# Patient Record
Sex: Female | Born: 1940 | Race: White | Hispanic: No | Smoking: Former smoker
Health system: Southern US, Community
[De-identification: ages and names within clinical notes are randomized; demographics above are authoritative.]

## PROBLEM LIST (undated history)

## (undated) DIAGNOSIS — I10 Essential (primary) hypertension: Secondary | ICD-10-CM

## (undated) DIAGNOSIS — R4189 Other symptoms and signs involving cognitive functions and awareness: Secondary | ICD-10-CM

## (undated) DIAGNOSIS — F329 Major depressive disorder, single episode, unspecified: Secondary | ICD-10-CM

## (undated) DIAGNOSIS — H269 Unspecified cataract: Secondary | ICD-10-CM

## (undated) DIAGNOSIS — H409 Unspecified glaucoma: Secondary | ICD-10-CM

## (undated) DIAGNOSIS — J449 Chronic obstructive pulmonary disease, unspecified: Secondary | ICD-10-CM

## (undated) DIAGNOSIS — K922 Gastrointestinal hemorrhage, unspecified: Secondary | ICD-10-CM

## (undated) DIAGNOSIS — F32A Depression, unspecified: Secondary | ICD-10-CM

## (undated) DIAGNOSIS — I6529 Occlusion and stenosis of unspecified carotid artery: Secondary | ICD-10-CM

## (undated) DIAGNOSIS — J439 Emphysema, unspecified: Secondary | ICD-10-CM

## (undated) DIAGNOSIS — E119 Type 2 diabetes mellitus without complications: Secondary | ICD-10-CM

## (undated) DIAGNOSIS — M549 Dorsalgia, unspecified: Secondary | ICD-10-CM

## (undated) DIAGNOSIS — I639 Cerebral infarction, unspecified: Secondary | ICD-10-CM

## (undated) DIAGNOSIS — D649 Anemia, unspecified: Secondary | ICD-10-CM

## (undated) DIAGNOSIS — C4491 Basal cell carcinoma of skin, unspecified: Secondary | ICD-10-CM

## (undated) DIAGNOSIS — N289 Disorder of kidney and ureter, unspecified: Secondary | ICD-10-CM

## (undated) DIAGNOSIS — F419 Anxiety disorder, unspecified: Secondary | ICD-10-CM

## (undated) DIAGNOSIS — Z5189 Encounter for other specified aftercare: Secondary | ICD-10-CM

## (undated) HISTORY — DX: Encounter for other specified aftercare: Z51.89

## (undated) HISTORY — PX: COLONOSCOPY: SHX174

## (undated) HISTORY — DX: Anemia, unspecified: D64.9

## (undated) HISTORY — DX: Gastrointestinal hemorrhage, unspecified: K92.2

## (undated) HISTORY — DX: Depression, unspecified: F32.A

## (undated) HISTORY — DX: Anxiety disorder, unspecified: F41.9

## (undated) HISTORY — DX: Unspecified cataract: H26.9

## (undated) HISTORY — DX: Unspecified glaucoma: H40.9

## (undated) HISTORY — DX: Basal cell carcinoma of skin, unspecified: C44.91

## (undated) HISTORY — DX: Chronic obstructive pulmonary disease, unspecified: J44.9

## (undated) HISTORY — DX: Emphysema, unspecified: J43.9

## (undated) HISTORY — DX: Cerebral infarction, unspecified: I63.9

## (undated) HISTORY — PX: VAGINAL HYSTERECTOMY: SUR661

## (undated) HISTORY — PX: BASAL CELL CARCINOMA EXCISION: SHX1214

---
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
## 2018-06-14 ENCOUNTER — Other Ambulatory Visit: Payer: Self-pay | Admitting: Family Medicine

## 2018-06-14 DIAGNOSIS — Z1231 Encounter for screening mammogram for malignant neoplasm of breast: Secondary | ICD-10-CM

## 2019-08-01 ENCOUNTER — Inpatient Hospital Stay (HOSPITAL_COMMUNITY)
Admission: EM | Admit: 2019-08-01 | Discharge: 2019-08-10 | DRG: 304 | Disposition: A | Discharge status: Skilled Nursing Facility | Payer: Medicare Other | Attending: Internal Medicine | Admitting: Internal Medicine

## 2019-08-01 ENCOUNTER — Emergency Department (HOSPITAL_COMMUNITY): Payer: Medicare Other

## 2019-08-01 ENCOUNTER — Other Ambulatory Visit: Payer: Self-pay

## 2019-08-01 ENCOUNTER — Encounter (HOSPITAL_COMMUNITY): Payer: Self-pay | Admitting: *Deleted

## 2019-08-01 DIAGNOSIS — N1832 Chronic kidney disease, stage 3b: Secondary | ICD-10-CM | POA: Diagnosis present

## 2019-08-01 DIAGNOSIS — N3000 Acute cystitis without hematuria: Secondary | ICD-10-CM | POA: Diagnosis present

## 2019-08-01 DIAGNOSIS — I10 Essential (primary) hypertension: Secondary | ICD-10-CM | POA: Diagnosis present

## 2019-08-01 DIAGNOSIS — F17200 Nicotine dependence, unspecified, uncomplicated: Secondary | ICD-10-CM | POA: Diagnosis present

## 2019-08-01 DIAGNOSIS — Z794 Long term (current) use of insulin: Secondary | ICD-10-CM

## 2019-08-01 DIAGNOSIS — Z79899 Other long term (current) drug therapy: Secondary | ICD-10-CM

## 2019-08-01 DIAGNOSIS — I160 Hypertensive urgency: Secondary | ICD-10-CM | POA: Diagnosis not present

## 2019-08-01 DIAGNOSIS — R29704 NIHSS score 4: Secondary | ICD-10-CM | POA: Diagnosis present

## 2019-08-01 DIAGNOSIS — F0390 Unspecified dementia without behavioral disturbance: Secondary | ICD-10-CM | POA: Diagnosis present

## 2019-08-01 DIAGNOSIS — J449 Chronic obstructive pulmonary disease, unspecified: Secondary | ICD-10-CM | POA: Diagnosis present

## 2019-08-01 DIAGNOSIS — R109 Unspecified abdominal pain: Secondary | ICD-10-CM

## 2019-08-01 DIAGNOSIS — N183 Chronic kidney disease, stage 3 unspecified: Secondary | ICD-10-CM | POA: Diagnosis present

## 2019-08-01 DIAGNOSIS — E1122 Type 2 diabetes mellitus with diabetic chronic kidney disease: Secondary | ICD-10-CM | POA: Diagnosis present

## 2019-08-01 DIAGNOSIS — F05 Delirium due to known physiological condition: Secondary | ICD-10-CM | POA: Diagnosis present

## 2019-08-01 DIAGNOSIS — I169 Hypertensive crisis, unspecified: Secondary | ICD-10-CM | POA: Diagnosis not present

## 2019-08-01 DIAGNOSIS — Z7902 Long term (current) use of antithrombotics/antiplatelets: Secondary | ICD-10-CM

## 2019-08-01 DIAGNOSIS — I129 Hypertensive chronic kidney disease with stage 1 through stage 4 chronic kidney disease, or unspecified chronic kidney disease: Secondary | ICD-10-CM | POA: Diagnosis present

## 2019-08-01 DIAGNOSIS — E876 Hypokalemia: Secondary | ICD-10-CM | POA: Diagnosis not present

## 2019-08-01 DIAGNOSIS — G9341 Metabolic encephalopathy: Secondary | ICD-10-CM | POA: Diagnosis present

## 2019-08-01 DIAGNOSIS — E119 Type 2 diabetes mellitus without complications: Secondary | ICD-10-CM

## 2019-08-01 DIAGNOSIS — Z20822 Contact with and (suspected) exposure to covid-19: Secondary | ICD-10-CM | POA: Diagnosis present

## 2019-08-01 DIAGNOSIS — N200 Calculus of kidney: Secondary | ICD-10-CM | POA: Diagnosis present

## 2019-08-01 DIAGNOSIS — I639 Cerebral infarction, unspecified: Secondary | ICD-10-CM | POA: Diagnosis present

## 2019-08-01 HISTORY — DX: Type 2 diabetes mellitus without complications: E11.9

## 2019-08-01 HISTORY — DX: Dorsalgia, unspecified: M54.9

## 2019-08-01 HISTORY — DX: Disorder of kidney and ureter, unspecified: N28.9

## 2019-08-01 HISTORY — DX: Essential (primary) hypertension: I10

## 2019-08-01 HISTORY — DX: Occlusion and stenosis of unspecified carotid artery: I65.29

## 2019-08-01 HISTORY — DX: Other symptoms and signs involving cognitive functions and awareness: R41.89

## 2019-08-01 LAB — URINALYSIS, ROUTINE W REFLEX MICROSCOPIC
Bacteria, UA: NONE SEEN
Bilirubin Urine: NEGATIVE
Glucose, UA: NEGATIVE mg/dL
Hgb urine dipstick: NEGATIVE
Ketones, ur: NEGATIVE mg/dL
Nitrite: NEGATIVE
Protein, ur: >=300 mg/dL — AB
Specific Gravity, Urine: 1.03 (ref 1.005–1.030)
pH: 6 (ref 5.0–8.0)

## 2019-08-01 LAB — COMPREHENSIVE METABOLIC PANEL
ALT: 13 U/L (ref 0–44)
AST: 17 U/L (ref 15–41)
Albumin: 3.4 g/dL — ABNORMAL LOW (ref 3.5–5.0)
Alkaline Phosphatase: 49 U/L (ref 38–126)
Anion gap: 8 (ref 5–15)
BUN: 22 mg/dL (ref 8–23)
CO2: 27 mmol/L (ref 22–32)
Calcium: 9.2 mg/dL (ref 8.9–10.3)
Chloride: 100 mmol/L (ref 98–111)
Creatinine, Ser: 1.38 mg/dL — ABNORMAL HIGH (ref 0.44–1.00)
GFR calc Af Amer: 42 mL/min — ABNORMAL LOW (ref >60)
GFR calc non Af Amer: 37 mL/min — ABNORMAL LOW (ref >60)
Glucose, Bld: 134 mg/dL — ABNORMAL HIGH (ref 70–99)
Potassium: 3.5 mmol/L (ref 3.5–5.1)
Sodium: 135 mmol/L (ref 135–145)
Total Bilirubin: 0.9 mg/dL (ref 0.3–1.2)
Total Protein: 6.5 g/dL (ref 6.5–8.1)

## 2019-08-01 LAB — CBC
HCT: 33.9 % — ABNORMAL LOW (ref 36.0–46.0)
Hemoglobin: 11.2 g/dL — ABNORMAL LOW (ref 12.0–15.0)
MCH: 32.2 pg (ref 26.0–34.0)
MCHC: 33 g/dL (ref 30.0–36.0)
MCV: 97.4 fL (ref 80.0–100.0)
Platelets: 354 10*3/uL (ref 150–400)
RBC: 3.48 MIL/uL — ABNORMAL LOW (ref 3.87–5.11)
RDW: 13.2 % (ref 11.5–15.5)
WBC: 7.7 10*3/uL (ref 4.0–10.5)
nRBC: 0 % (ref 0.0–0.2)

## 2019-08-01 LAB — LIPASE, BLOOD: Lipase: 36 U/L (ref 11–51)

## 2019-08-01 IMAGING — CT CT HEAD W/O CM
4 series · 17 of 47 positions shown, 19 images · non-contrast
Comparison: [DATE]

CLINICAL DATA: Elevated blood pressure left-sided pain.

EXAM:
CT HEAD WITHOUT CONTRAST
TECHNIQUE: Contiguous axial images were obtained from the base of the skull
through the vertex without intravenous contrast.

[Series 2: head wo · axial · 0.38mm/px · z∈[+1322,+1442]mm · 7 of 32 slices shown, 9 images]
[im 4/32  brain]
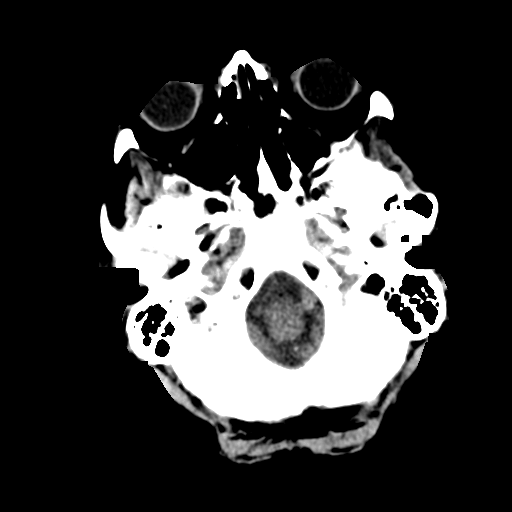
[im 4/32  bone]
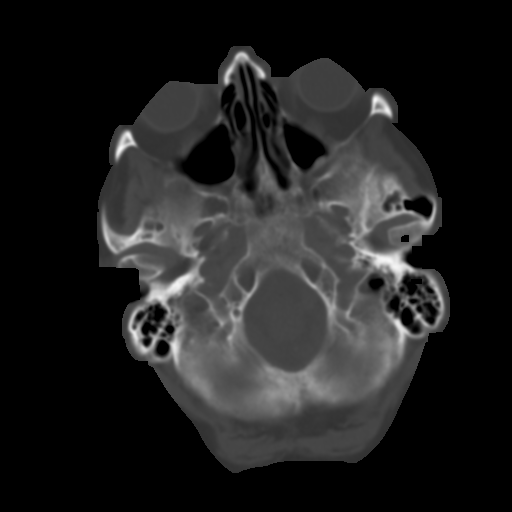
[im 8/32  brain]
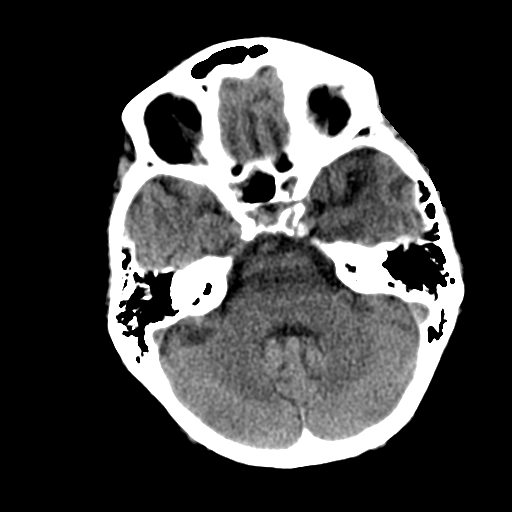
[im 12/32  brain]
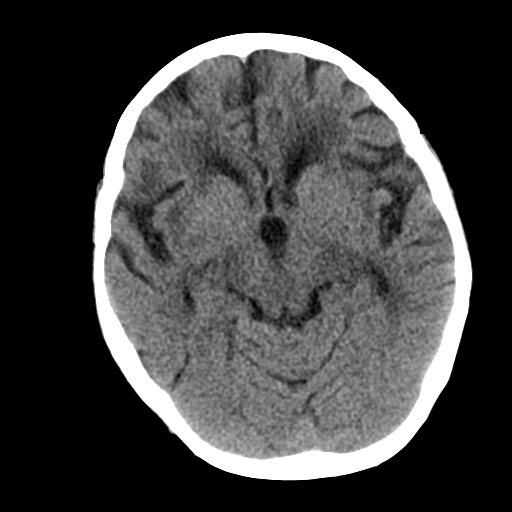
[im 16/32  brain]
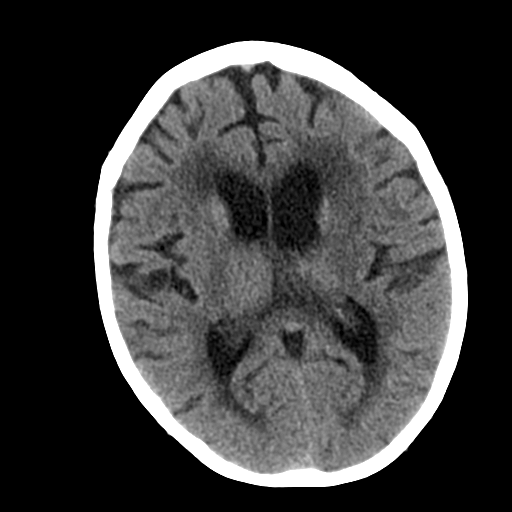
[im 20/32  brain]
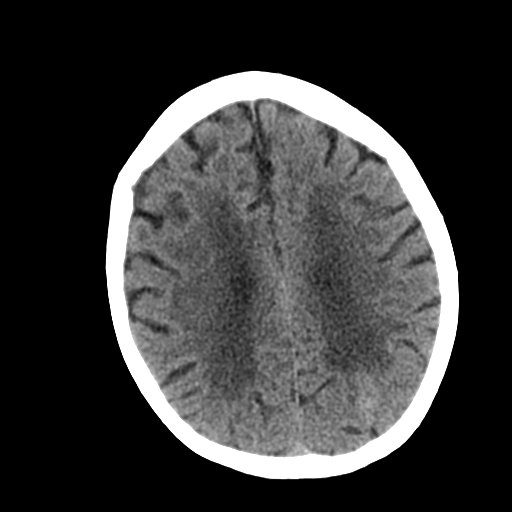
[im 20/32  bone]
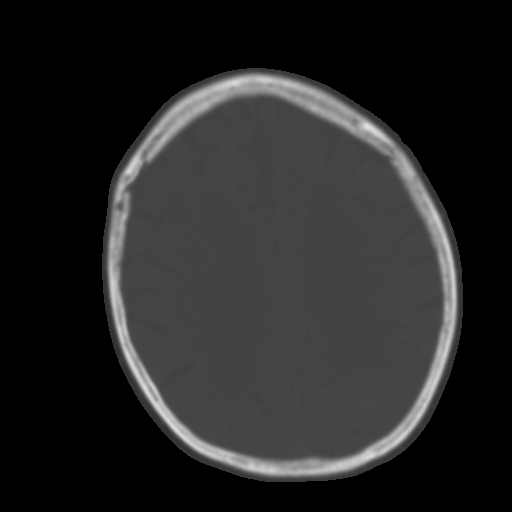
[im 24/32  brain]
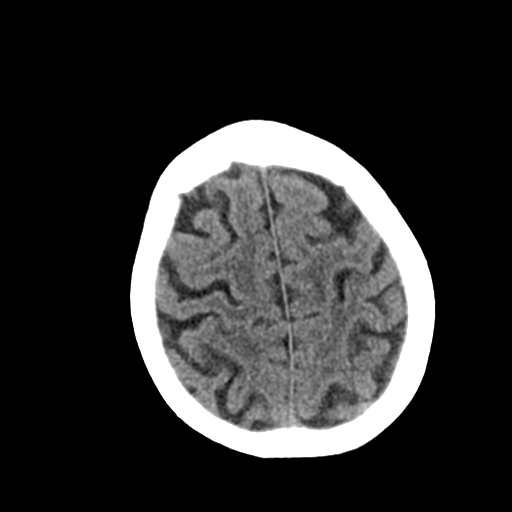
[im 28/32  brain]
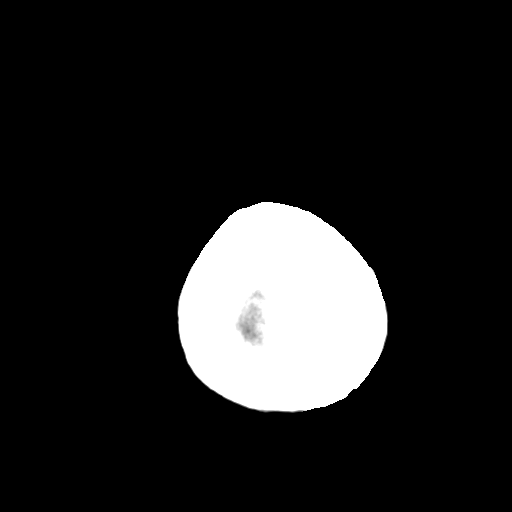

[Series 3: head bone · axial · 0.38mm/px · z∈[+1321,+1377]mm · 4 of 80 slices shown]
[im 8/80  bone]
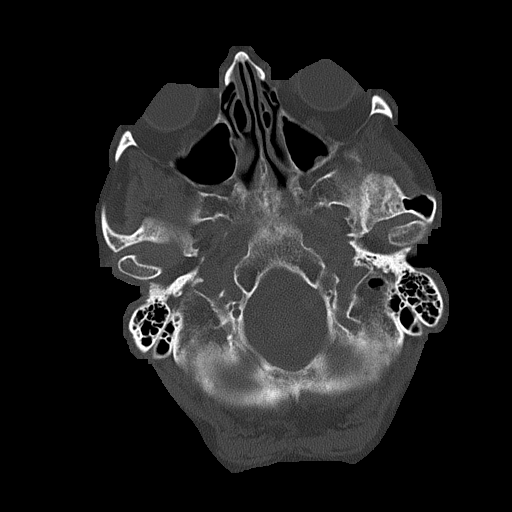
[im 16/80  bone]
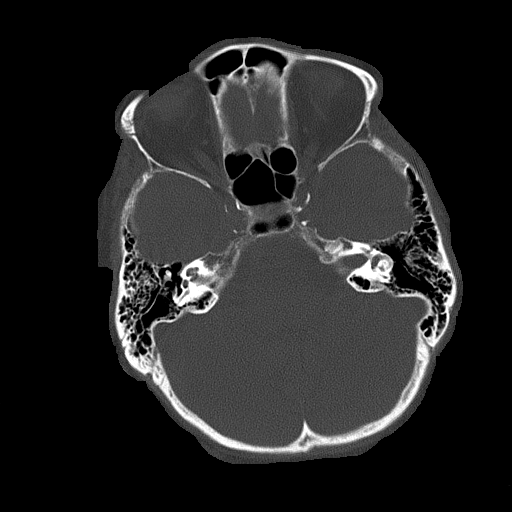
[im 24/80  bone]
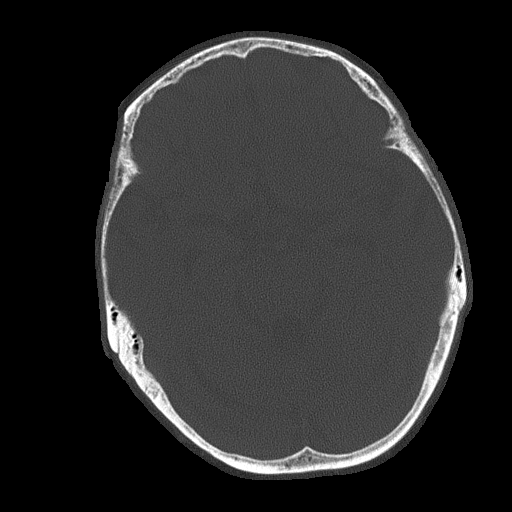
[im 36/80  bone]
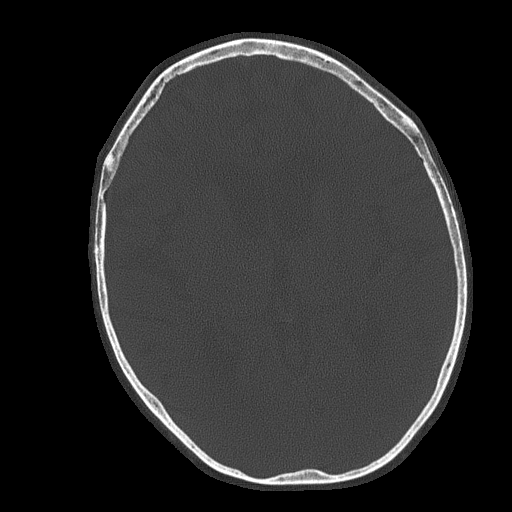

[Series 5: coronal soft tissue · coronal · 0.30mm/px · 3 of 63 slices shown]
[im 21/63  brain]
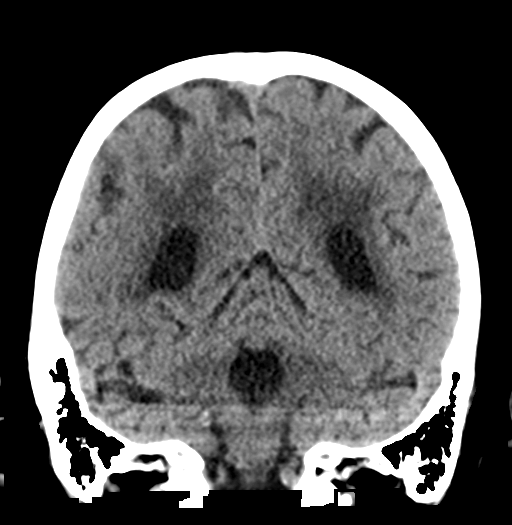
[im 28/63  brain]
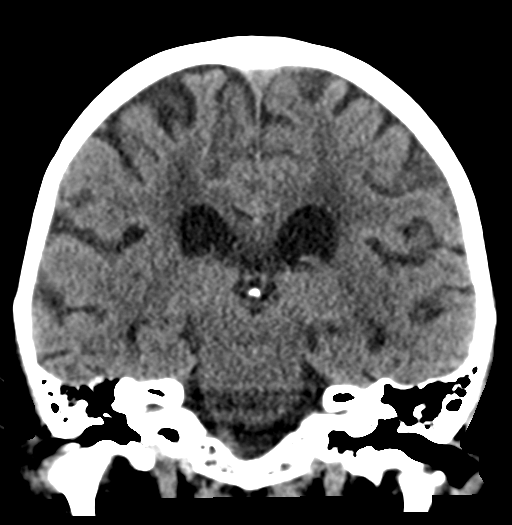
[im 35/63  brain]
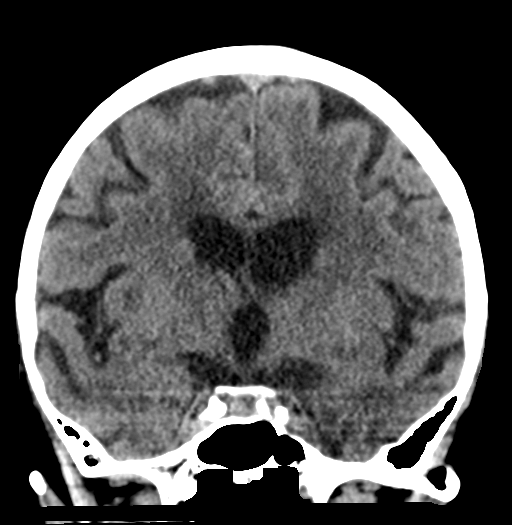

[Series 6: sagittal soft tissue · sagittal · 0.31mm/px · 3 of 52 slices shown]
[im 18/52  brain]
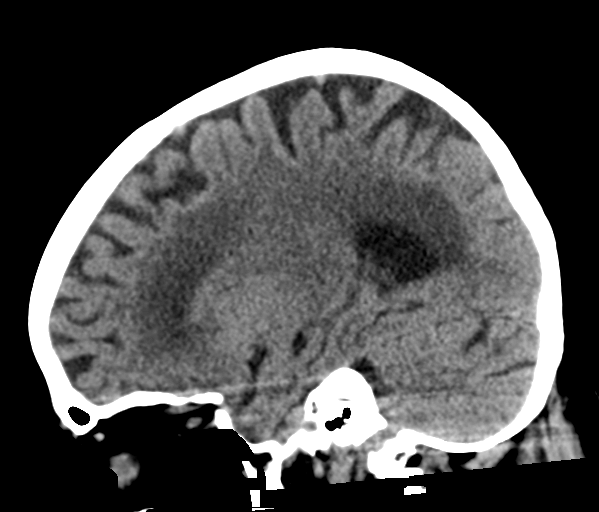
[im 26/52  brain]
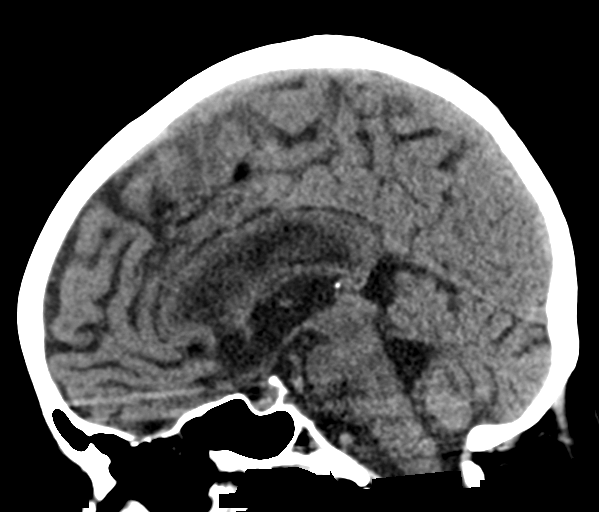
[im 35/52  brain]
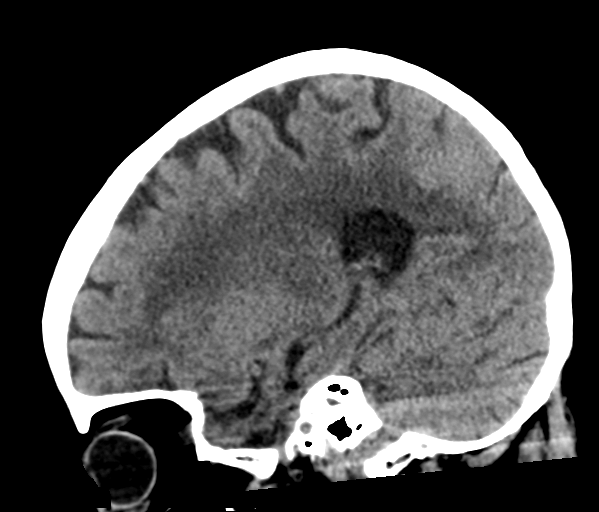

[17 of 47 positions shown; findings below may reference images not displayed]

FINDINGS: Brain: There is moderate severity cerebral atrophy with widening of
the extra-axial spaces and ventricular dilatation.
There are areas of decreased attenuation within the white matter
tracts of the supratentorial brain, consistent with microvascular
disease changes.

Small chronic right basal ganglia lacunar infarcts are seen.

Vascular: No hyperdense vessel or unexpected calcification.

Skull: Normal. Negative for fracture or focal lesion.

Sinuses/Orbits: No acute finding.

Other: None.
IMPRESSION: No acute intracranial pathology.

## 2019-08-01 IMAGING — CT CT ANGIO CHEST-ABD-PELV FOR DISSECTION W/ AND WO/W CM
2 of 7 series · 13 of 46 positions shown, 15 images · IV contrast (omnipaque)
Comparison: Chest, abdomen and pelvis CT, dated [DATE], is
available for comparison.

CLINICAL DATA: Elevated blood pressure left-sided pain.

EXAM:
CT ANGIOGRAPHY CHEST, ABDOMEN AND PELVIS
TECHNIQUE: Multidetector CT imaging through the chest, abdomen and pelvis was
performed using the standard protocol during bolus administration of
intravenous contrast. Multiplanar reconstructed images and MIPs were
obtained and reviewed to evaluate the vascular anatomy.
CONTRAST:  80mL OMNIPAQUE IOHEXOL 350 MG/ML SOLN

[Series 6: axial arterial · axial · arterial · 0.64mm/px · z∈[+658,+1171]mm · 10 of 199 slices shown, 12 images]
[im 14/199  soft-tissue]
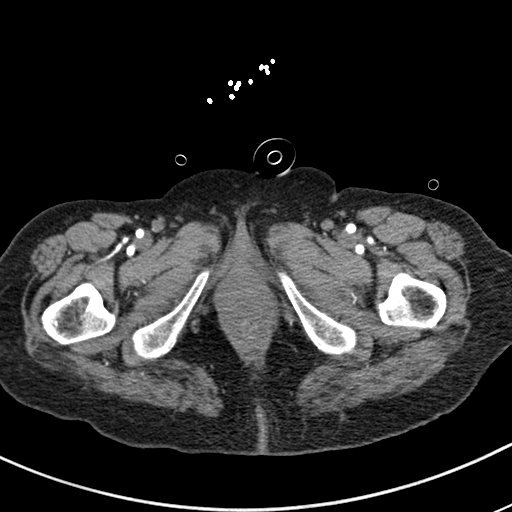
[im 14/199  bone]
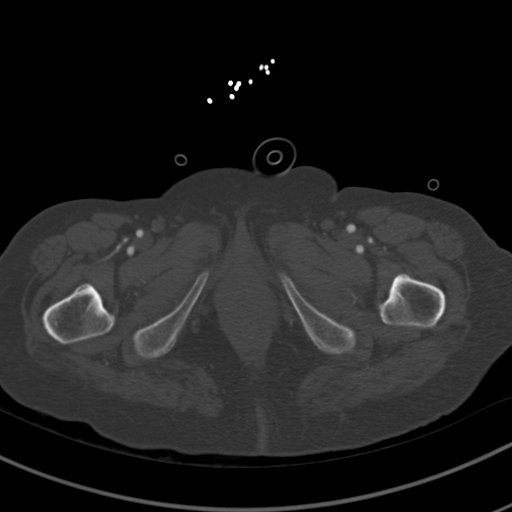
[im 40/199  soft-tissue]
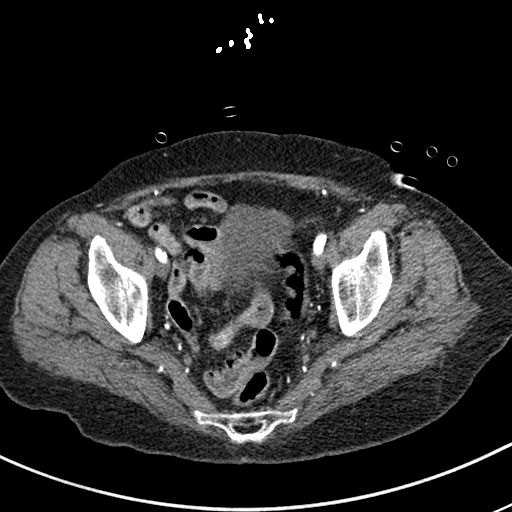
[im 53/199  soft-tissue]
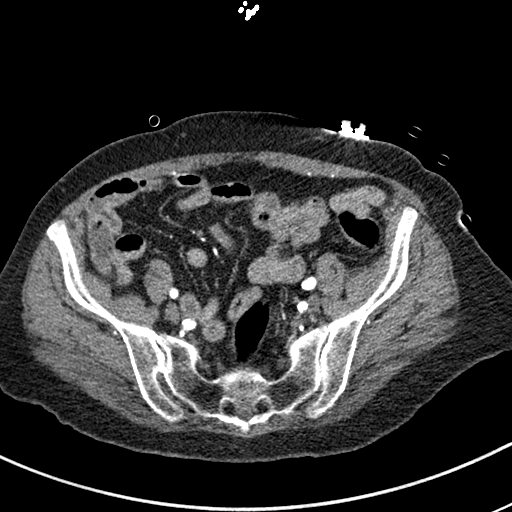
[im 67/199  soft-tissue]
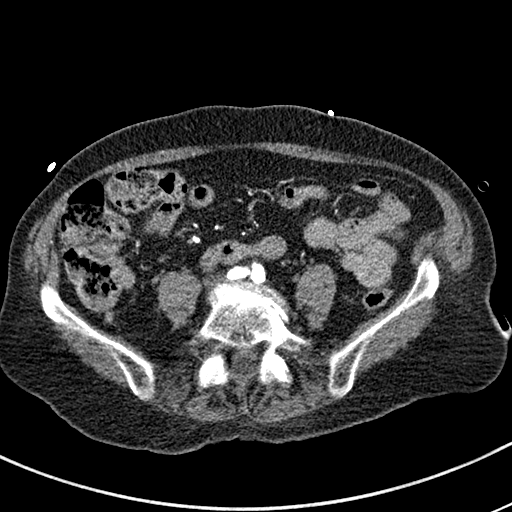
[im 93/199  soft-tissue]
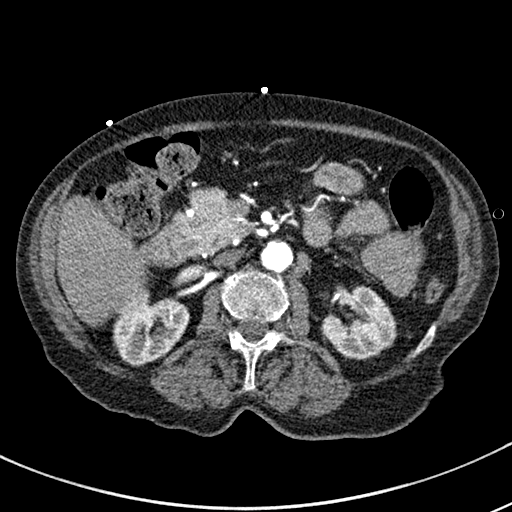
[im 106/199  soft-tissue]
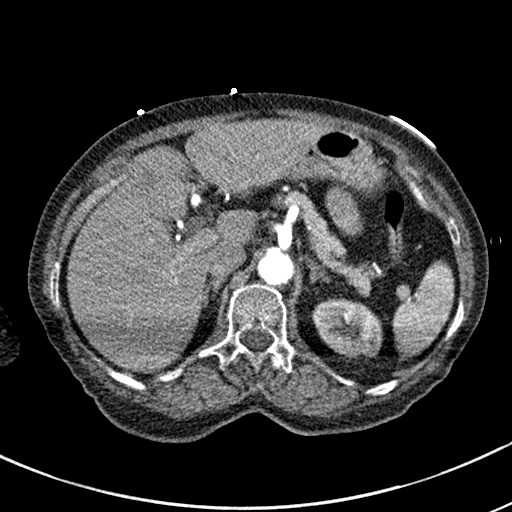
[im 133/199  soft-tissue]
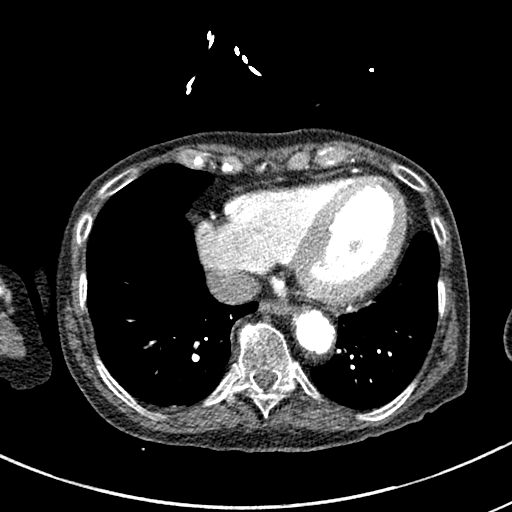
[im 146/199  soft-tissue]
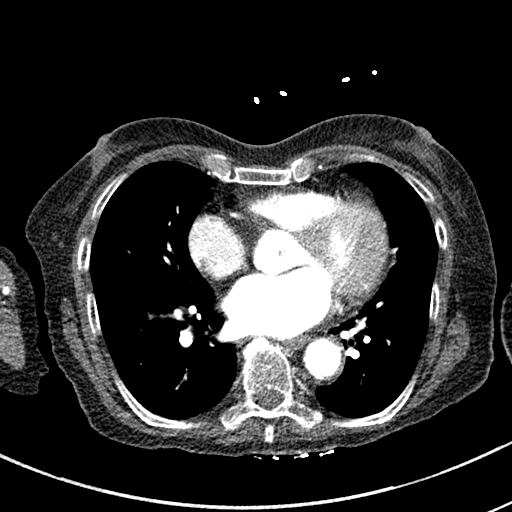
[im 159/199  soft-tissue]
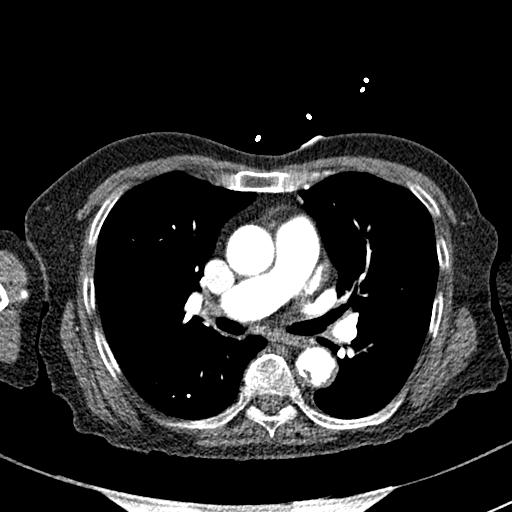
[im 159/199  bone]
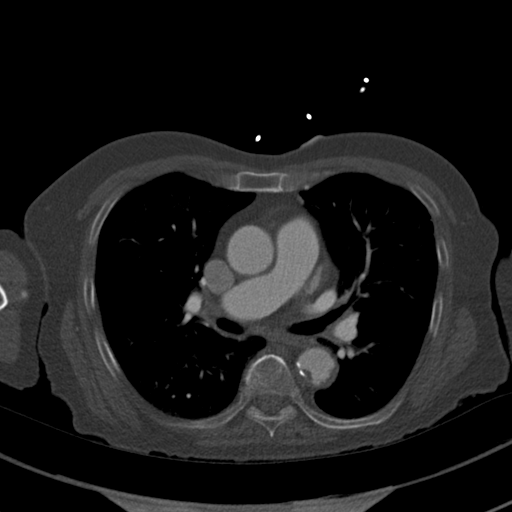
[im 185/199  soft-tissue]
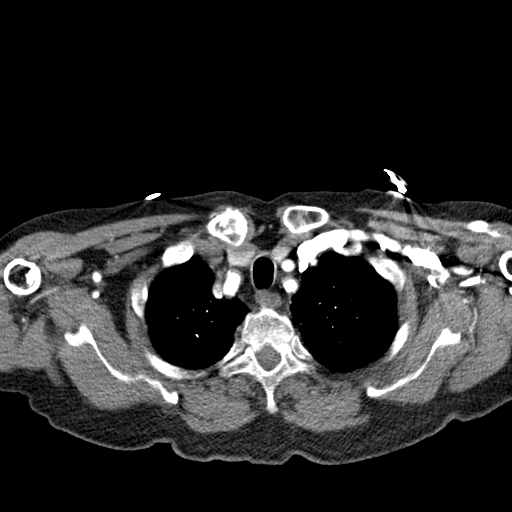

[Series 9: coronals · coronal · 0.65mm/px · 3 of 126 slices shown]
[im 32/126  soft-tissue]
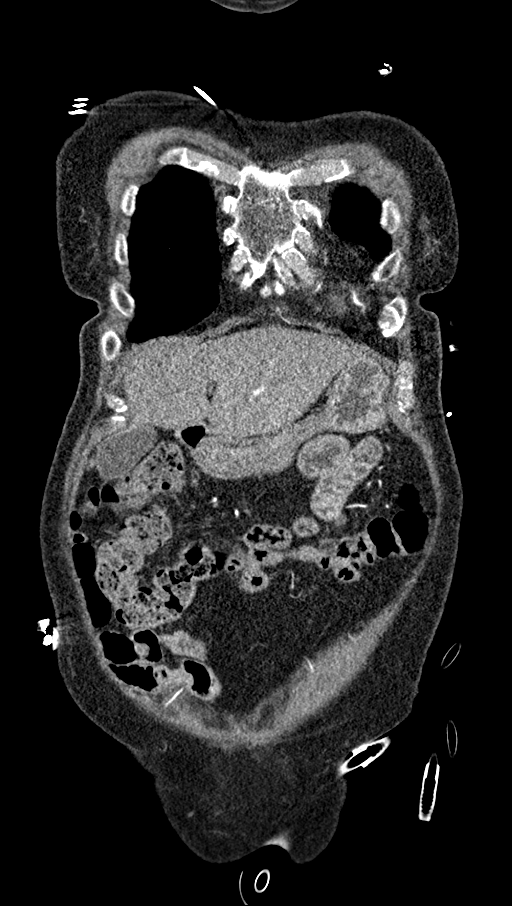
[im 63/126  soft-tissue]
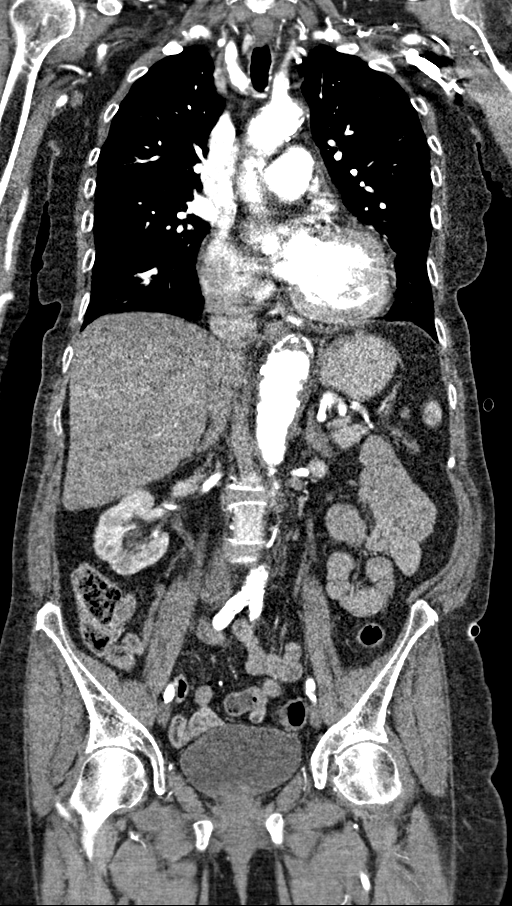
[im 94/126  soft-tissue]
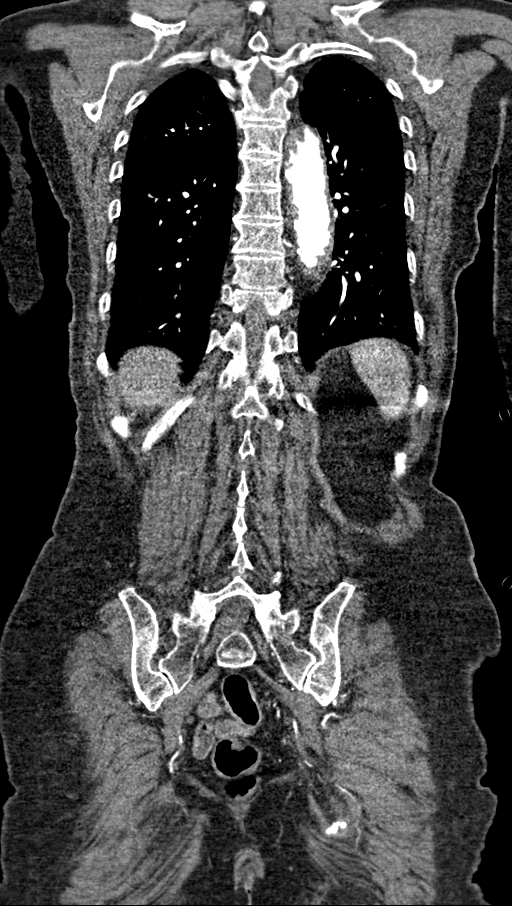

[13 of 46 positions shown; findings below may reference images not displayed]

FINDINGS: CTA CHEST FINDINGS

Cardiovascular: There is stable marked severity calcification and
atherosclerosis seen throughout the thoracic aorta. Satisfactory
opacification of the pulmonary arteries to the segmental level. No
evidence of pulmonary embolism. Normal heart size. No pericardial
effusion.

Mediastinum/Nodes: No enlarged mediastinal, hilar, or axillary lymph
nodes. A 6 mm cyst is seen within the left lobe of the thyroid
gland.

Lungs/Pleura: There is mild biapical scarring and/or atelectasis.

There is no evidence of a pleural effusion or pneumothorax.

Musculoskeletal: Multilevel degenerative changes seen throughout the
thoracic spine.

Review of the MIP images confirms the above findings.

CTA ABDOMEN AND PELVIS FINDINGS

VASCULAR

Aorta: Marked severity calcification and atherosclerosis without
evidence of aneurysmal dilatation or dissection.

Celiac: Mild calcification without evidence of aneurysmal dilatation
or dissection.

SMA: Patent without evidence of aneurysm, dissection, vasculitis or
significant stenosis.

Renals: Mild calcification without evidence of aneurysmal dilatation
or dissection.

IMA: Patent without evidence of aneurysm, dissection, vasculitis or
significant stenosis.

Inflow: Marked severity calcification without evidence of aneurysmal
dilatation or dissection

Veins: No obvious venous abnormality within the limitations of this
arterial phase study.

Review of the MIP images confirms the above findings.

NON-VASCULAR

Hepatobiliary: No focal liver abnormality is seen. No gallstones,
gallbladder wall thickening, or biliary dilatation.

Pancreas: Unremarkable. No pancreatic ductal dilatation or
surrounding inflammatory changes.

Spleen: Normal in size without focal abnormality.

Adrenals/Urinary Tract: Adrenal glands are unremarkable. Kidneys are
normal in size without hydronephrosis. A stable 5 mm non obstructing
renal stone is seen within the mid to lower left kidney. A stable
1.0 cm cyst is seen along the posteromedial aspect of the mid right
kidney. Bladder is unremarkable.

Stomach/Bowel: Stomach is within normal limits. Appendix appears
normal. No evidence of bowel wall thickening, distention, or
inflammatory changes.

Lymphatic: No enlarged abdominal or pelvic lymph nodes.

Reproductive: Status post hysterectomy. No adnexal masses.

Other: No abdominal wall hernia or abnormality. No abdominopelvic
ascites.

Musculoskeletal: Multilevel degenerative changes seen throughout the
lumbar spine. This is most prominent at the levels of L4-L5 and
L5-S1.

Review of the MIP images confirms the above findings.
IMPRESSION: 1. No evidence of pulmonary embolus, aortic dissection or other
acute vascular pathology.
2. No acute or active cardiopulmonary disease.
3. Marked severity calcification and atherosclerosis involving the
thoracic and abdominal aorta which is stable in appearance when
compared to the prior study dated [DATE] [DATE], [DATE].
[DATE]. [DATE] mm non obstructing renal stone within the mid to lower left
kidney.
5. Multilevel degenerative changes of the thoracic and lumbar spine.

## 2019-08-01 IMAGING — CT CT RENAL STONE PROTOCOL
2 of 4 series · 16 of 46 positions shown, 18 images · non-contrast
Comparison: [DATE]

CLINICAL DATA: Flank pain.

EXAM:
CT ABDOMEN AND PELVIS WITHOUT CONTRAST
TECHNIQUE: Multidetector CT imaging of the abdomen and pelvis was performed
following the standard protocol without IV contrast.

[Series 2: axial st · axial · 0.65mm/px · z∈[-409,-64]mm · 13 of 79 slices shown, 15 images]
[im 5/79  soft-tissue]
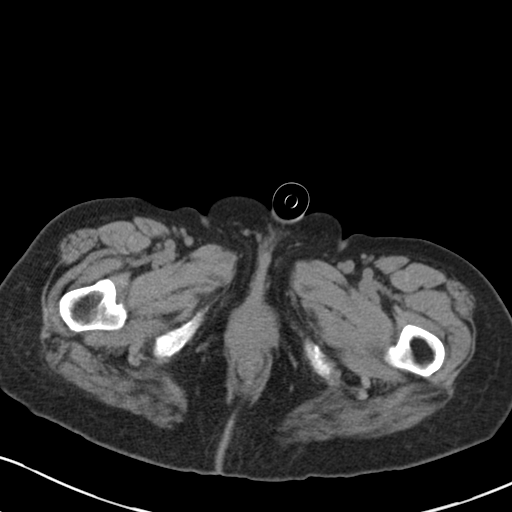
[im 5/79  bone]
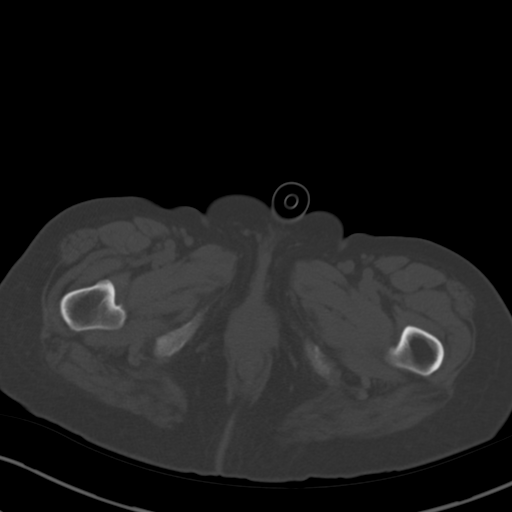
[im 9/79  soft-tissue]
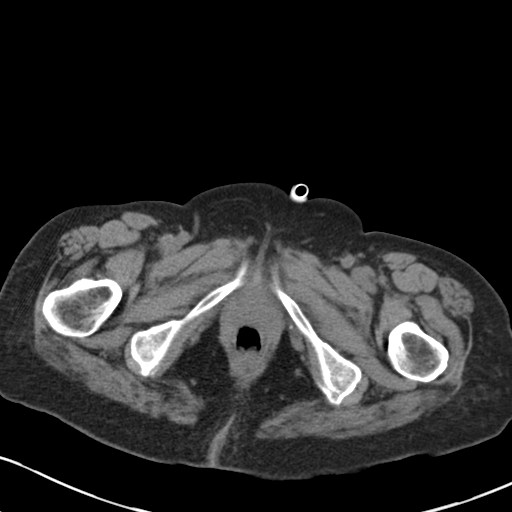
[im 18/79  soft-tissue]
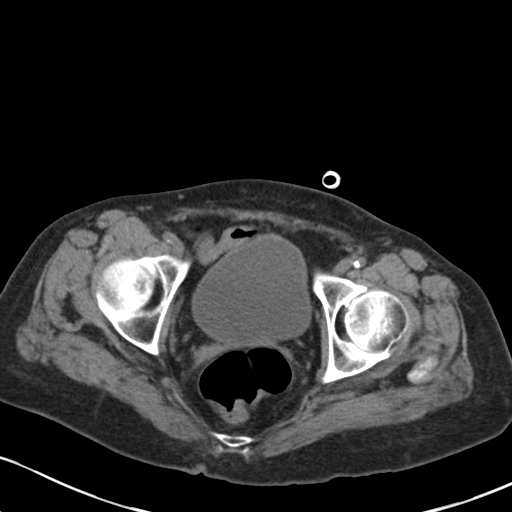
[im 22/79  soft-tissue]
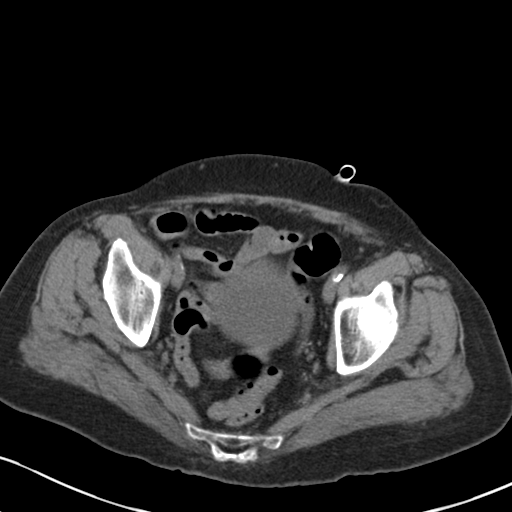
[im 27/79  soft-tissue]
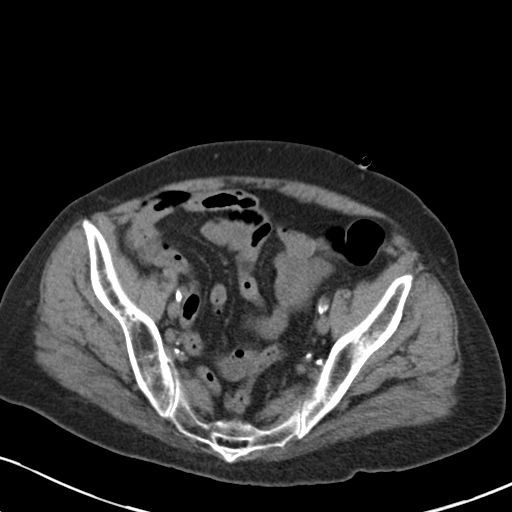
[im 35/79  soft-tissue]
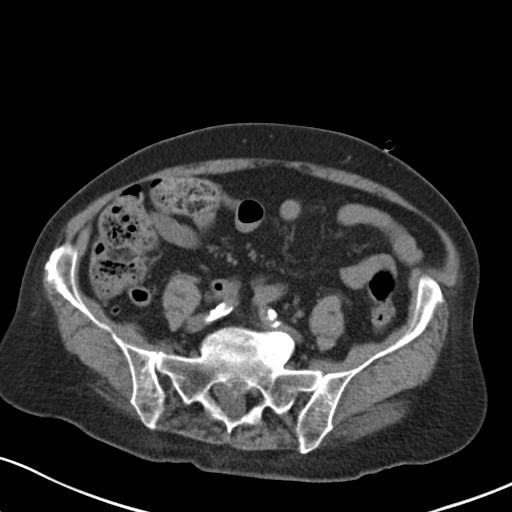
[im 40/79  soft-tissue]
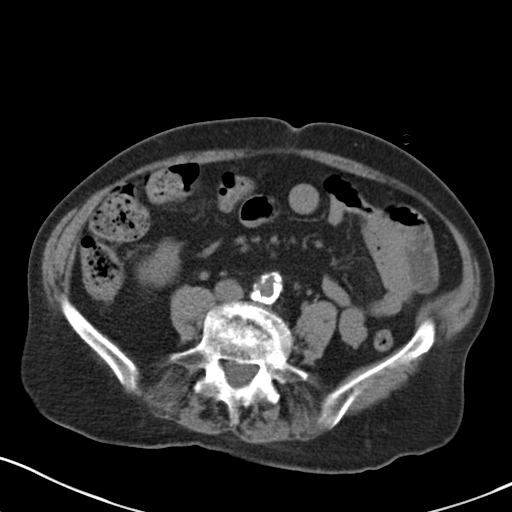
[im 44/79  soft-tissue]
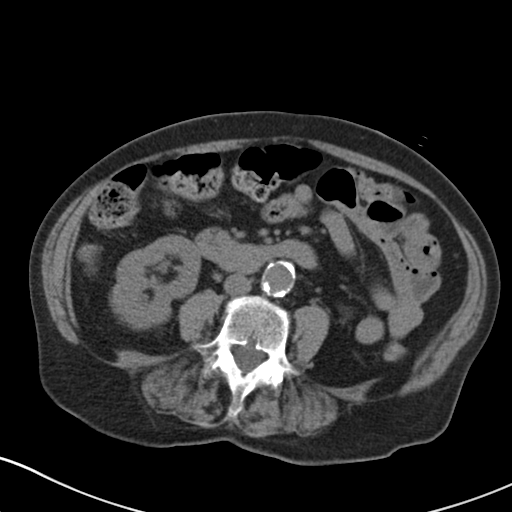
[im 53/79  soft-tissue]
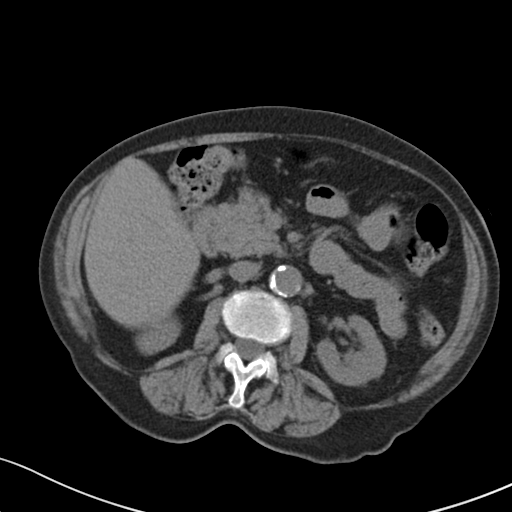
[im 53/79  bone]
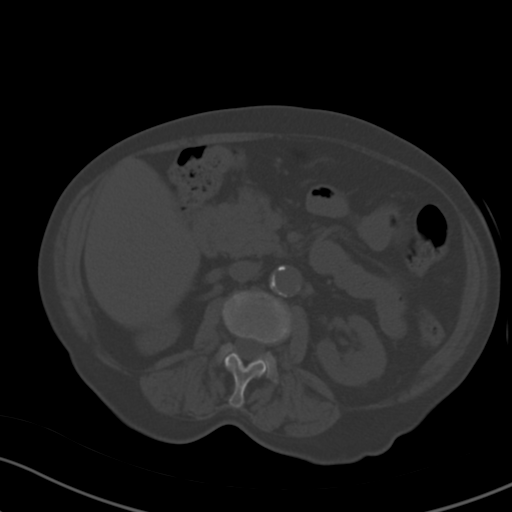
[im 57/79  soft-tissue]
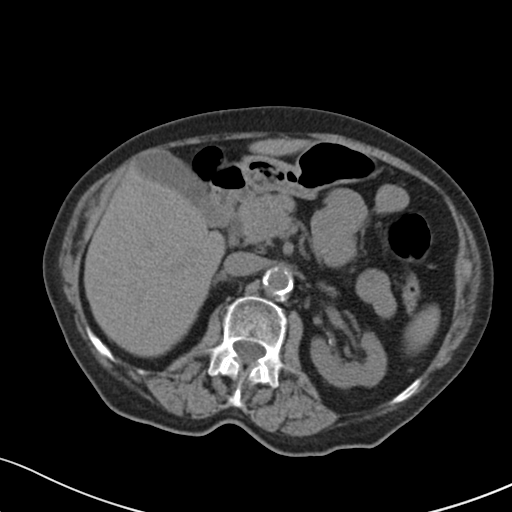
[im 61/79  soft-tissue]
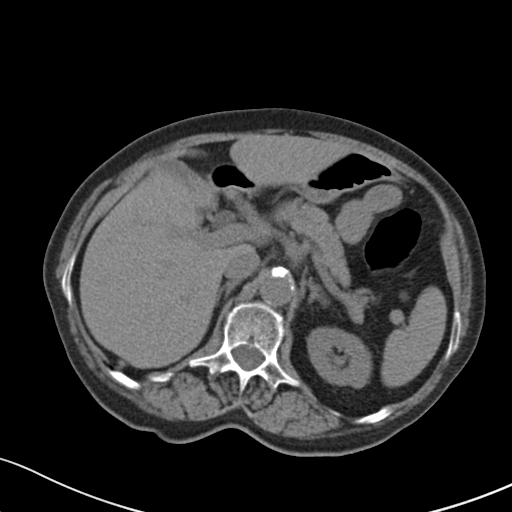
[im 70/79  soft-tissue]
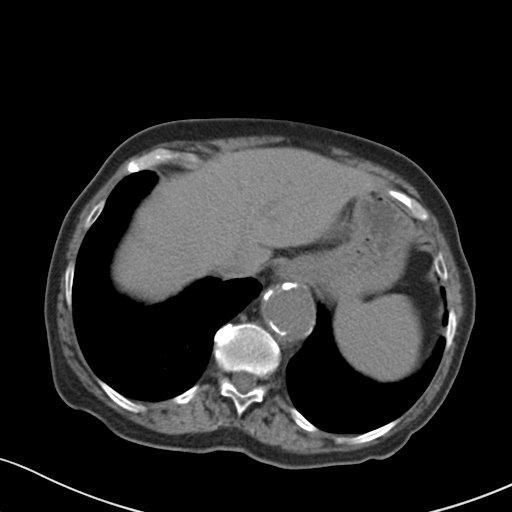
[im 74/79  soft-tissue]
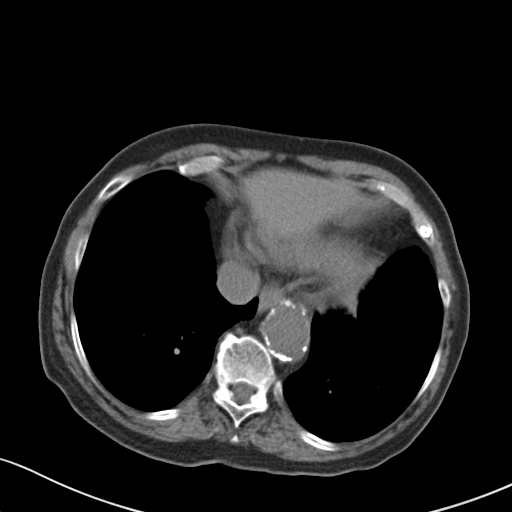

[Series 5: coronal · coronal · 0.64mm/px · 3 of 127 slices shown]
[im 43/127  soft-tissue]
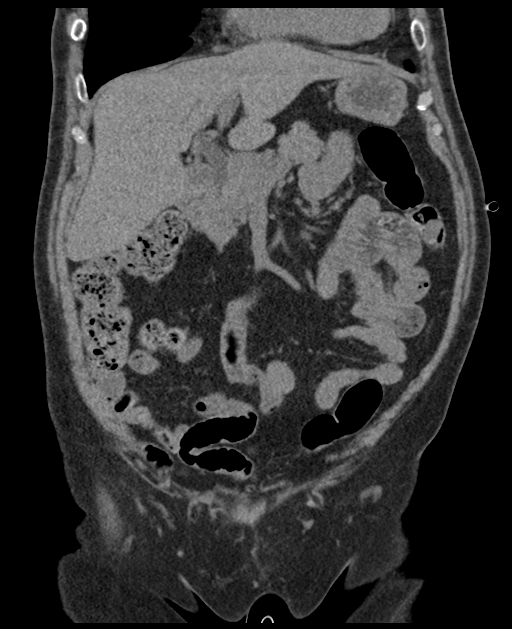
[im 57/127  soft-tissue]
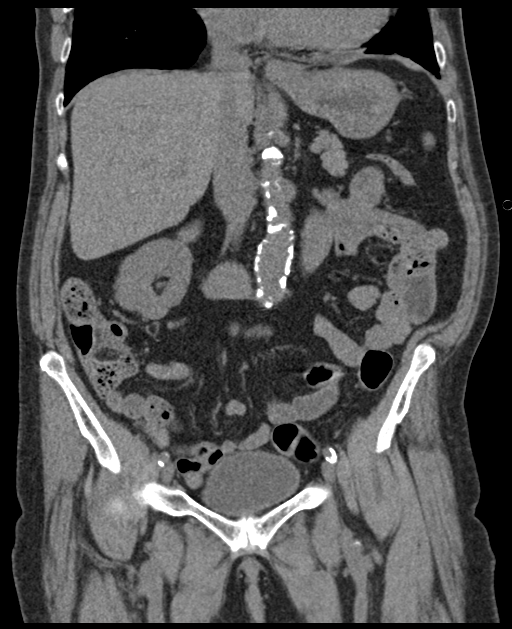
[im 71/127  soft-tissue]
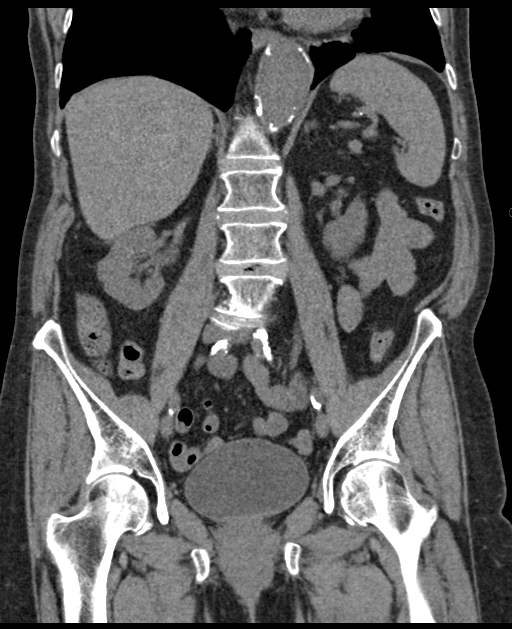

[16 of 46 positions shown; findings below may reference images not displayed]

FINDINGS: Lower chest: No acute abnormality.

Hepatobiliary: No focal liver abnormality is seen. No gallstones,
gallbladder wall thickening, or biliary dilatation.

Pancreas: Unremarkable. No pancreatic ductal dilatation or
surrounding inflammatory changes.

Spleen: Normal in size without focal abnormality.

Adrenals/Urinary Tract: Adrenal glands are unremarkable. Kidneys are
normal in size. A 1.0 cm cyst is seen along the posterior aspect of
the mid right kidney. Adjacent 3 mm nonobstructing renal stones are
seen within the lower pole of the left kidney. Bladder is
unremarkable.

Stomach/Bowel: Stomach is within normal limits. Appendix appears
normal. No evidence of bowel wall thickening, distention, or
inflammatory changes.

Vascular/Lymphatic: Marked severity aortic atherosclerosis. No
enlarged abdominal or pelvic lymph nodes.

Reproductive: Status post hysterectomy. No adnexal masses.

Other: No abdominal wall hernia or abnormality. No abdominopelvic
ascites.

Musculoskeletal: Multilevel degenerative changes seen throughout the
lumbar spine.
IMPRESSION: 1. Adjacent 3 mm nonobstructing renal stones within the left kidney.
2. Small right renal cyst.

Aortic Atherosclerosis ([1S]-[1S]).

## 2019-08-01 MED ORDER — AMLODIPINE BESYLATE 5 MG PO TABS: 5.0000 mg | ORAL_TABLET | Freq: Once | ORAL | Status: DC

## 2019-08-01 MED ORDER — HYDRALAZINE HCL 20 MG/ML IJ SOLN
10.0000 mg | Freq: Once | INTRAMUSCULAR | Status: AC
  Administered 2019-08-01: 21:00:00 10 mg via INTRAVENOUS
  Filled 2019-08-01: qty 1

## 2019-08-01 MED ORDER — METOPROLOL TARTRATE 25 MG PO TABS
50.0000 mg | ORAL_TABLET | Freq: Once | ORAL | Status: AC
  Administered 2019-08-01: 18:00:00 50 mg via ORAL
  Filled 2019-08-01: qty 2

## 2019-08-01 MED ORDER — METOPROLOL TARTRATE 25 MG PO TABS: 50.0000 mg | ORAL_TABLET | Freq: Two times a day (BID) | ORAL | Status: DC

## 2019-08-01 MED ORDER — ACETAMINOPHEN 500 MG PO TABS
1000.0000 mg | ORAL_TABLET | Freq: Once | ORAL | Status: AC
  Administered 2019-08-01: 16:00:00 1000 mg via ORAL
  Filled 2019-08-01: qty 2

## 2019-08-01 MED ORDER — LABETALOL HCL 5 MG/ML IV SOLN
20.0000 mg | Freq: Once | INTRAVENOUS | Status: AC
  Administered 2019-08-01: 20 mg via INTRAVENOUS
  Filled 2019-08-01: qty 4

## 2019-08-01 MED ORDER — IOHEXOL 350 MG/ML SOLN
80.0000 mL | Freq: Once | INTRAVENOUS | Status: AC | PRN
  Administered 2019-08-01: 19:00:00 80 mL via INTRAVENOUS

## 2019-08-01 MED ORDER — SODIUM CHLORIDE (PF) 0.9 % IJ SOLN
INTRAMUSCULAR | Status: AC
  Filled 2019-08-01: qty 50

## 2019-08-01 NOTE — ED Provider Notes (Signed)
[facility] DEPT Provider Note   CSN: [id number] Arrival date & time: 08/01/19  1404     History Chief Complaint  Patient presents with  . Hypertension    Chloe Fletcher is a 79 y.o. female.  Patient is a 79 year old female with a history of dementia, hypertension, diabetes who is presenting today from her facility due to elevated blood pressure and left-sided pain.  It is unclear how long the pain is been going on.  Patient states she thinks it has been for 3 days but she admits that she cannot always remember well.  She is complaining of left side pain that radiates down into the groin as well as some dysuria.  No reported temperature noted by the facility.  They also noted her blood pressure today was elevated in the 123456 systolic.  Patient has been taking her medications.  No report of vomiting, diarrhea, cough or shortness of breath.  Patient states the pain is nagging and annoying but intermittently can be very sharp and uncomfortable.  The history is provided by the patient, the nursing home and the EMS personnel.  Hypertension       Past Medical History:  Diagnosis Date  . Back pain   . Carotid artery stenosis   . Cognitive decline   . Diabetes mellitus without complication ([facility])   . Hypertension   . Renal disorder     There are no problems to display for this patient.   History reviewed. No pertinent surgical history.   OB History   No obstetric history on file.     No family history on file.  Social History   Tobacco Use  . Smoking status: Current Every Day Smoker  . Smokeless tobacco: Never Used  Substance Use Topics  . Alcohol use: Not Currently  . Drug use: Not Currently    Home Medications Prior to Admission medications   Not on File    Allergies    Patient has no known allergies.  Review of Systems   Review of Systems  All other systems reviewed and are negative.   Physical Exam Updated Vital Signs BP (!)  172/57   Pulse 65   Temp 97.7 F (36.5 C) (Oral)   Resp 18   Ht 5\' 2"  (1.575 m)   Wt 54.4 kg   SpO2 100%   BMI 21.95 kg/m   Physical Exam Vitals and nursing note reviewed.  Constitutional:      General: She is in acute distress.     Appearance: She is well-developed.  HENT:     Head: Normocephalic and atraumatic.  Eyes:     Pupils: Pupils are equal, round, and reactive to light.  Cardiovascular:     Rate and Rhythm: Normal rate and regular rhythm.     Heart sounds: Normal heart sounds. No murmur. No friction rub.  Pulmonary:     Effort: Pulmonary effort is normal.     Breath sounds: Normal breath sounds. No wheezing or rales.  Abdominal:     General: Bowel sounds are normal. There is no distension.     Palpations: Abdomen is soft.     Tenderness: There is abdominal tenderness in the left lower quadrant. There is left CVA tenderness and guarding. There is no rebound.  Musculoskeletal:        General: No tenderness. Normal range of motion.     Right lower leg: No edema.     Left lower leg: No edema.  Comments: No edema  Skin:    General: Skin is warm and dry.     Capillary Refill: Capillary refill takes less than 2 seconds.     Findings: No rash.  Neurological:     General: No focal deficit present.     Mental Status: She is alert and oriented to person, place, and time. Mental status is at baseline.     Cranial Nerves: No cranial nerve deficit.  Psychiatric:        Mood and Affect: Mood normal.        Behavior: Behavior normal.        Thought Content: Thought content normal.     ED Results / Procedures / Treatments   Labs (all labs ordered are listed, but only abnormal results are displayed) Labs Reviewed  CBC - Abnormal; Notable for the following components:      Result Value   RBC 3.48 (*)    Hemoglobin 11.2 (*)    HCT 33.9 (*)    All other components within normal limits  LIPASE, BLOOD  COMPREHENSIVE METABOLIC PANEL  URINALYSIS, ROUTINE W REFLEX  MICROSCOPIC    EKG None  Radiology No results found.  Procedures Procedures (including critical care time)  Medications Ordered in ED Medications - No data to display  ED Course  I have reviewed the triage vital signs and the nursing notes.  Pertinent labs & imaging results that were available during my care of the patient were reviewed by me and considered in my medical decision making (see chart for details).    MDM Rules/Calculators/A&P                      79 year old female presenting today with worsening left side pain.  EMS was initially called because SNF noted extremely elevated blood pressure.  Patient has been taking her medications.  Here blood pressure is 172/57 but more concerning is her left flank pain and dysuria.  Patient is overall well-appearing.  However concern for kidney stone versus pyelonephritis.  Lower suspicion for diverticulitis.  Low suspicion for appendicitis, pancreatitis or cholecystitis.  Labs, urine and imaging pending.  Patient given Tylenol.  Spoke with patient's son Chloe Fletcher.  He reports that recently she has seen Dr. Royce Macadamia with nephrology and her blood pressure medications have been changed.  She was stopped on amlodipine and hydrochlorothiazide and losartan was restarted at half and she continues to take metoprolol.  Also he reports that she has had cognitive decline over the last month or so and is requesting information on who they can follow-up with for further evaluation.  He does report that she is recently had imaging that ruled out stroke.  Patient's labs today are reassuring but urine is still pending.  CT is negative for an obstructive renal stone or other acute pathology.  Patient is comfortable and relaxing in the room on her phone.  Repeat blood pressure remains high however she is flexing against the cuff so we will attempt to keep her arm relaxed while taking the blood pressure.  She was given her evening dose of metoprolol.  Final  Clinical Impression(s) / ED Diagnoses Final diagnoses:  None    Rx / DC Orders ED Discharge Orders    None       Blanchie Dessert, MD 08/01/19 1737

## 2019-08-01 NOTE — ED Triage Notes (Signed)
Patient arrived by EMS from [facility] @ [facility].  Facility reports increased blood pressure, left lower back pain and dysuria.  Patient alert and oriented x 3, is confused at baseline per facility.

## 2019-08-01 NOTE — ED Notes (Signed)
Patient took of BP cuff. Explained to patient we are monitoring her BP. BP placed back on patients right arm.

## 2019-08-01 NOTE — ED Notes (Signed)
Manual BP of 218/60

## 2019-08-01 NOTE — ED Notes (Addendum)
Pure wick placed on patient. Patient encouraged to void.  

## 2019-08-01 NOTE — ED Notes (Signed)
[facility] called and gave report to [facility]. Daughter called and informed pt is going back to facility. Facility made aware of increased BP and follow up with Dr. Royce Macadamia office for BP management. facility made aware to monitor pt for acute changes in vision, headache, chest pain, or weakness/ tingling.

## 2019-08-01 NOTE — Discharge Instructions (Addendum)
It will be important to call Dr. Luis Abed office tomorrow for recommendations on blood pressure medications changes.  Ms. Barbera did receive her evening dose of metoprolol today.  You can call the numbers provided to find a neurologist who specializes in memory problems.  Labs today look good and CAT scan has no acute issues.    Blood pressure still remains elevated.  You should follow-up with your primary doctor for further adjustments of your blood pressure medication.  Return to the ED with headache, chest pain focal weakness, numbness, tingling, concerns.

## 2019-08-01 NOTE — ED Notes (Signed)
EDP made aware of Pt BP still remains elevated at 208/68. EDP advise to continue with discharge.

## 2019-08-01 NOTE — ED Notes (Signed)
[facility] called Paperwork printed.

## 2019-08-01 NOTE — ED Notes (Signed)
[place], EDP at bedside.   New BP cuff placed on patient and BP started.

## 2019-08-01 NOTE — ED Notes (Addendum)
Patient unable to void at this time.   Bladder scan-35mLs  BP in 200s  [place], EDP made aware.

## 2019-08-01 NOTE — ED Notes (Addendum)
Manual BP taken 230/70 X2.  Rancour, MD made aware.   Patient is asymptomatic and only complaining of mild left flank pain.

## 2019-08-01 NOTE — ED Provider Notes (Addendum)
Care assumed from Dr. Maryan Rued.  Patient with a history of dementia, hypertension and diabetes here with elevated blood pressure and left-sided flank pain.  She is awaiting urinalysis. She was found to be hypertensive with recent changes in her medications. CT scan shows no AAA and no ureteral stones.  Dr. Maryan Rued discussed with patient's primary.  She has recently had adjustments of her blood pressure medications.  IV Labetalol given the persistent hypertension.  Continues to complain of ongoing left flank pain.  CT scan shows no evidence of aortic dissection or AAA.  CT head is negative.  Patient's blood pressure has improved to 99991111 systolic.  She denies symptoms.  Discussed with patient's son [name] by phone.  He is aware of her ongoing issues with her elevated blood pressure.  She may have increased blood pressure today due to pain.  As we discussed it is possible she may have passed a kidney stone. Urinalysis today shows blood but no infection.  Culture is sent.  Patient is comfortable in her room. Advise continue her blood pressure medication and with follow-up with her doctor. She is to call Dr. Luis Abed office in the morning for further adjustments of her blood pressure medication.   Ezequiel Essex, MD 08/01/19 2326  Addendum 12:45 AM.  Patient becoming hypertensive again to the [id number] systolic.  This is abnormal for her by her son's report.  He also reports a subacute decline in her mental status which may be related to her hypertensive urgency. Plan for admission discussed with son and he agrees.  Discussed with Dr. Velia Meyer.  CRITICAL CARE Performed by: Ezequiel Essex Total critical care time: 35 minutes Critical care time was exclusive of separately billable procedures and treating other patients. Critical care was necessary to treat or prevent imminent or life-threatening deterioration. Critical care was time spent personally by me on the following activities: development  of treatment plan with patient and/or surrogate as well as nursing, discussions with consultants, evaluation of patient's response to treatment, examination of patient, obtaining history from patient or surrogate, ordering and performing treatments and interventions, ordering and review of laboratory studies, ordering and review of radiographic studies, pulse oximetry and re-evaluation of patient's condition.    Ezequiel Essex, MD 08/02/19 [phone]

## 2019-08-02 ENCOUNTER — Encounter (HOSPITAL_COMMUNITY): Payer: Self-pay | Admitting: Internal Medicine

## 2019-08-02 DIAGNOSIS — N3000 Acute cystitis without hematuria: Secondary | ICD-10-CM | POA: Diagnosis present

## 2019-08-02 DIAGNOSIS — E119 Type 2 diabetes mellitus without complications: Secondary | ICD-10-CM

## 2019-08-02 DIAGNOSIS — F17200 Nicotine dependence, unspecified, uncomplicated: Secondary | ICD-10-CM | POA: Diagnosis present

## 2019-08-02 DIAGNOSIS — E876 Hypokalemia: Secondary | ICD-10-CM | POA: Diagnosis not present

## 2019-08-02 DIAGNOSIS — F0390 Unspecified dementia without behavioral disturbance: Secondary | ICD-10-CM | POA: Diagnosis present

## 2019-08-02 DIAGNOSIS — I639 Cerebral infarction, unspecified: Secondary | ICD-10-CM | POA: Diagnosis not present

## 2019-08-02 DIAGNOSIS — I160 Hypertensive urgency: Secondary | ICD-10-CM | POA: Diagnosis present

## 2019-08-02 DIAGNOSIS — Z20822 Contact with and (suspected) exposure to covid-19: Secondary | ICD-10-CM | POA: Diagnosis present

## 2019-08-02 DIAGNOSIS — J449 Chronic obstructive pulmonary disease, unspecified: Secondary | ICD-10-CM | POA: Diagnosis present

## 2019-08-02 DIAGNOSIS — Z7902 Long term (current) use of antithrombotics/antiplatelets: Secondary | ICD-10-CM | POA: Diagnosis not present

## 2019-08-02 DIAGNOSIS — Z794 Long term (current) use of insulin: Secondary | ICD-10-CM | POA: Diagnosis not present

## 2019-08-02 DIAGNOSIS — N200 Calculus of kidney: Secondary | ICD-10-CM | POA: Diagnosis present

## 2019-08-02 DIAGNOSIS — G9341 Metabolic encephalopathy: Secondary | ICD-10-CM | POA: Diagnosis present

## 2019-08-02 DIAGNOSIS — Z79899 Other long term (current) drug therapy: Secondary | ICD-10-CM | POA: Diagnosis not present

## 2019-08-02 DIAGNOSIS — E1122 Type 2 diabetes mellitus with diabetic chronic kidney disease: Secondary | ICD-10-CM | POA: Diagnosis present

## 2019-08-02 DIAGNOSIS — R109 Unspecified abdominal pain: Secondary | ICD-10-CM

## 2019-08-02 DIAGNOSIS — F05 Delirium due to known physiological condition: Secondary | ICD-10-CM | POA: Diagnosis present

## 2019-08-02 DIAGNOSIS — I169 Hypertensive crisis, unspecified: Secondary | ICD-10-CM | POA: Diagnosis present

## 2019-08-02 DIAGNOSIS — R29704 NIHSS score 4: Secondary | ICD-10-CM | POA: Diagnosis present

## 2019-08-02 DIAGNOSIS — I10 Essential (primary) hypertension: Secondary | ICD-10-CM | POA: Diagnosis present

## 2019-08-02 DIAGNOSIS — N183 Chronic kidney disease, stage 3 unspecified: Secondary | ICD-10-CM | POA: Diagnosis present

## 2019-08-02 DIAGNOSIS — N1832 Chronic kidney disease, stage 3b: Secondary | ICD-10-CM | POA: Diagnosis present

## 2019-08-02 DIAGNOSIS — I129 Hypertensive chronic kidney disease with stage 1 through stage 4 chronic kidney disease, or unspecified chronic kidney disease: Secondary | ICD-10-CM | POA: Diagnosis present

## 2019-08-02 DIAGNOSIS — R55 Syncope and collapse: Secondary | ICD-10-CM | POA: Diagnosis not present

## 2019-08-02 LAB — RAPID URINE DRUG SCREEN, HOSP PERFORMED
Amphetamines: NOT DETECTED
Barbiturates: NOT DETECTED
Benzodiazepines: NOT DETECTED
Cocaine: NOT DETECTED
Opiates: NOT DETECTED
Tetrahydrocannabinol: NOT DETECTED

## 2019-08-02 LAB — COMPREHENSIVE METABOLIC PANEL
ALT: 13 U/L (ref 0–44)
AST: 16 U/L (ref 15–41)
Albumin: 3.1 g/dL — ABNORMAL LOW (ref 3.5–5.0)
Alkaline Phosphatase: 47 U/L (ref 38–126)
Anion gap: 10 (ref 5–15)
BUN: 20 mg/dL (ref 8–23)
CO2: 23 mmol/L (ref 22–32)
Calcium: 9.1 mg/dL (ref 8.9–10.3)
Chloride: 100 mmol/L (ref 98–111)
Creatinine, Ser: 1.47 mg/dL — ABNORMAL HIGH (ref 0.44–1.00)
GFR calc Af Amer: 39 mL/min — ABNORMAL LOW (ref >60)
GFR calc non Af Amer: 34 mL/min — ABNORMAL LOW (ref >60)
Glucose, Bld: 134 mg/dL — ABNORMAL HIGH (ref 70–99)
Potassium: 3.3 mmol/L — ABNORMAL LOW (ref 3.5–5.1)
Sodium: 133 mmol/L — ABNORMAL LOW (ref 135–145)
Total Bilirubin: 0.5 mg/dL (ref 0.3–1.2)
Total Protein: 5.9 g/dL — ABNORMAL LOW (ref 6.5–8.1)

## 2019-08-02 LAB — CBC
HCT: 32.5 % — ABNORMAL LOW (ref 36.0–46.0)
Hemoglobin: 10.9 g/dL — ABNORMAL LOW (ref 12.0–15.0)
MCH: 32.1 pg (ref 26.0–34.0)
MCHC: 33.5 g/dL (ref 30.0–36.0)
MCV: 95.6 fL (ref 80.0–100.0)
Platelets: 307 10*3/uL (ref 150–400)
RBC: 3.4 MIL/uL — ABNORMAL LOW (ref 3.87–5.11)
RDW: 13.2 % (ref 11.5–15.5)
WBC: 8 10*3/uL (ref 4.0–10.5)
nRBC: 0 % (ref 0.0–0.2)

## 2019-08-02 LAB — GLUCOSE, CAPILLARY
Glucose-Capillary: 119 mg/dL — ABNORMAL HIGH (ref 70–99)
Glucose-Capillary: 116 mg/dL — ABNORMAL HIGH (ref 70–99)
Glucose-Capillary: 110 mg/dL — ABNORMAL HIGH (ref 70–99)
Glucose-Capillary: 247 mg/dL — ABNORMAL HIGH (ref 70–99)

## 2019-08-02 LAB — RESPIRATORY PANEL BY RT PCR (FLU A&B, COVID)
Influenza A by PCR: NEGATIVE
Influenza B by PCR: NEGATIVE
SARS Coronavirus 2 by RT PCR: NEGATIVE

## 2019-08-02 LAB — MAGNESIUM: Magnesium: 1.8 mg/dL (ref 1.7–2.4)

## 2019-08-02 LAB — TSH: TSH: 0.96 u[IU]/mL (ref 0.350–4.500)

## 2019-08-02 LAB — HEMOGLOBIN A1C
Hgb A1c MFr Bld: 5.7 % — ABNORMAL HIGH (ref 4.8–5.6)
Mean Plasma Glucose: 116.89 mg/dL

## 2019-08-02 MED ORDER — FENOFIBRATE 54 MG PO TABS
54.0000 mg | ORAL_TABLET | Freq: Every day | ORAL | Status: DC
  Administered 2019-08-02 – 2019-08-09 (×8): 54 mg via ORAL
  Filled 2019-08-02 (×9): qty 1

## 2019-08-02 MED ORDER — SODIUM CHLORIDE 0.9% FLUSH
3.0000 mL | Freq: Two times a day (BID) | INTRAVENOUS | Status: DC
  Administered 2019-08-02 – 2019-08-09 (×16): 3 mL via INTRAVENOUS

## 2019-08-02 MED ORDER — CLOPIDOGREL BISULFATE 75 MG PO TABS
75.0000 mg | ORAL_TABLET | Freq: Every day | ORAL | Status: DC
  Administered 2019-08-02 – 2019-08-08 (×7): 75 mg via ORAL
  Filled 2019-08-02 (×8): qty 1

## 2019-08-02 MED ORDER — LABETALOL HCL 5 MG/ML IV SOLN
10.0000 mg | INTRAVENOUS | Status: DC | PRN
  Administered 2019-08-02 – 2019-08-09 (×3): 10 mg via INTRAVENOUS
  Filled 2019-08-02 (×3): qty 4

## 2019-08-02 MED ORDER — NICOTINE 14 MG/24HR TD PT24
14.0000 mg | MEDICATED_PATCH | Freq: Every day | TRANSDERMAL | Status: DC | PRN
  Administered 2019-08-04 – 2019-08-06 (×3): 14 mg via TRANSDERMAL
  Filled 2019-08-02 (×3): qty 1

## 2019-08-02 MED ORDER — TAMSULOSIN HCL 0.4 MG PO CAPS
0.4000 mg | ORAL_CAPSULE | Freq: Once | ORAL | Status: AC
  Administered 2019-08-02: 0.4 mg via ORAL
  Filled 2019-08-02: qty 1

## 2019-08-02 MED ORDER — LOSARTAN POTASSIUM 25 MG PO TABS
12.5000 mg | ORAL_TABLET | Freq: Every day | ORAL | Status: DC
  Administered 2019-08-02 – 2019-08-03 (×2): 12.5 mg via ORAL
  Filled 2019-08-02 (×2): qty 1

## 2019-08-02 MED ORDER — ALBUTEROL SULFATE (2.5 MG/3ML) 0.083% IN NEBU: 3.0000 mL | INHALATION_SOLUTION | RESPIRATORY_TRACT | Status: DC | PRN

## 2019-08-02 MED ORDER — POTASSIUM CHLORIDE 20 MEQ PO PACK
40.0000 meq | PACK | Freq: Once | ORAL | Status: AC
  Administered 2019-08-02: 40 meq via ORAL
  Filled 2019-08-02: qty 2

## 2019-08-02 MED ORDER — METOPROLOL TARTRATE 50 MG PO TABS
50.0000 mg | ORAL_TABLET | Freq: Two times a day (BID) | ORAL | Status: DC
  Administered 2019-08-02 – 2019-08-09 (×16): 50 mg via ORAL
  Filled 2019-08-02 (×17): qty 1

## 2019-08-02 MED ORDER — SODIUM CHLORIDE 0.9 % IV SOLN
2.0000 g | Freq: Every day | INTRAVENOUS | Status: DC
  Administered 2019-08-03 – 2019-08-07 (×5): 2 g via INTRAVENOUS
  Filled 2019-08-02: qty 20
  Filled 2019-08-02 (×4): qty 2

## 2019-08-02 MED ORDER — FLUTICASONE FUROATE-VILANTEROL 100-25 MCG/INH IN AEPB
1.0000 | INHALATION_SPRAY | Freq: Every day | RESPIRATORY_TRACT | Status: DC
  Administered 2019-08-02 – 2019-08-10 (×8): 1 via RESPIRATORY_TRACT
  Filled 2019-08-02: qty 28

## 2019-08-02 MED ORDER — ACETAMINOPHEN 650 MG RE SUPP: 650.0000 mg | Freq: Four times a day (QID) | RECTAL | Status: DC | PRN

## 2019-08-02 MED ORDER — ENSURE ENLIVE PO LIQD
237.0000 mL | Freq: Two times a day (BID) | ORAL | Status: DC
  Administered 2019-08-02 – 2019-08-09 (×11): 237 mL via ORAL

## 2019-08-02 MED ORDER — ASPIRIN 325 MG PO TABS
325.0000 mg | ORAL_TABLET | Freq: Once | ORAL | Status: AC
  Administered 2019-08-02: 02:00:00 325 mg via ORAL
  Filled 2019-08-02: qty 1

## 2019-08-02 MED ORDER — ASPIRIN 325 MG PO TABS
ORAL_TABLET | ORAL | Status: AC
  Administered 2019-08-02: 02:00:00 325 mg
  Filled 2019-08-02: qty 1

## 2019-08-02 MED ORDER — ACETAMINOPHEN 325 MG PO TABS
650.0000 mg | ORAL_TABLET | Freq: Four times a day (QID) | ORAL | Status: DC | PRN
  Administered 2019-08-02: 21:00:00 650 mg via ORAL
  Filled 2019-08-02: qty 2

## 2019-08-02 MED ORDER — INSULIN ASPART 100 UNIT/ML ~~LOC~~ SOLN
0.0000 [IU] | Freq: Three times a day (TID) | SUBCUTANEOUS | Status: DC
  Administered 2019-08-03 – 2019-08-05 (×2): 1 [IU] via SUBCUTANEOUS
  Administered 2019-08-05: 12:00:00 3 [IU] via SUBCUTANEOUS
  Administered 2019-08-06 – 2019-08-07 (×3): 1 [IU] via SUBCUTANEOUS
  Administered 2019-08-08 (×2): 2 [IU] via SUBCUTANEOUS
  Administered 2019-08-09 – 2019-08-10 (×4): 1 [IU] via SUBCUTANEOUS
  Filled 2019-08-02: qty 0.09

## 2019-08-02 MED ORDER — AMLODIPINE BESYLATE 5 MG PO TABS
5.0000 mg | ORAL_TABLET | Freq: Every day | ORAL | Status: DC
  Administered 2019-08-02 – 2019-08-07 (×6): 5 mg via ORAL
  Filled 2019-08-02 (×7): qty 1

## 2019-08-02 NOTE — Progress Notes (Signed)
NUTRITION NOTE  Patient screened for MST with score of 2.0. She is a/o to self only. Current weight is 123 lb, weight at [facility] on 06/18/19 was 125 lb, and weight on 02/16/18 at [facility] was 121 lb.   Patient is Observation status. She is from [facility] with plan at this time to return to that facility. No nutrition needs at this time. If nutrition needs arise, please re-consult.     Jarome Matin, MS, RD, LDN, [facility] Inpatient Clinical Dietitian Pager # [phone] After hours/weekend pager # [phone]

## 2019-08-02 NOTE — ED Notes (Signed)
Pt ambulatory to restroom with standby assistance

## 2019-08-02 NOTE — Evaluation (Signed)
Physical Therapy Evaluation Patient Details Name: Chloe Fletcher MRN: [id number] DOB: Dec 19, 1940 Today's Date: 08/02/2019   History of Present Illness  79 yo female admitted with hypertensive urgency. Hx of dementia, DM, CKD, COPD  Clinical Impression  On eval, pt required Min assist for mobility. She walked to and from bathroom with 1 HHA. Unsteady. Unable to get any info from pt. Unsure of PLOF and assist available to pt where she lives-will need CM/SW consult. Pt had difficulty remaining awake during session. Will follow and progress activity as tolerated.     Follow Up Recommendations Home health PT;Supervision/Assistance - 24 hour(return to facility. Unsure of PLOF/assist available to pt)    Equipment Recommendations  None recommended by PT    Recommendations for Other Services       Precautions / Restrictions Precautions Precautions: Fall Restrictions Weight Bearing Restrictions: No      Mobility  Bed Mobility Overal bed mobility: Needs Assistance Bed Mobility: Supine to Sit;Sit to Supine     Supine to sit: Min guard;HOB elevated Sit to supine: Min guard;HOB elevated   General bed mobility comments: Increased time. Close guard for safety.  Transfers Overall transfer level: Needs assistance   Transfers: Sit to/from Stand Sit to Stand: Min assist         General transfer comment: Assist to rise, steady, control descent. Cues required.  Ambulation/Gait Ambulation/Gait assistance: Min assist Gait Distance (Feet): 12 Feet(x2) Assistive device: 1 person hand held assist Gait Pattern/deviations: Decreased stride length     General Gait Details: Assist to steady throughout short distance.  Stairs            Wheelchair Mobility    Modified Rankin (Stroke Patients Only)       Balance Overall balance assessment: Needs assistance           Standing balance-Leahy Scale: Fair                               Pertinent Vitals/Pain Pain  Assessment: Faces Faces Pain Scale: No hurt    Home Living Family/patient expects to be discharged to:: Unsure                      Prior Function           Comments: unsure of PLOF     Hand Dominance        Extremity/Trunk Assessment   Upper Extremity Assessment Upper Extremity Assessment: Generalized weakness    Lower Extremity Assessment Lower Extremity Assessment: Generalized weakness    Cervical / Trunk Assessment Cervical / Trunk Assessment: Kyphotic  Communication   Communication: HOH  Cognition Arousal/Alertness: Awake/alert(but drowsy/easily falls asleep) Behavior During Therapy: WFL for tasks assessed/performed Overall Cognitive Status: History of cognitive impairments - at baseline                                        General Comments      Exercises     Assessment/Plan    PT Assessment Patient needs continued PT services  PT Problem List Decreased mobility;Decreased balance;Decreased cognition;Decreased safety awareness       PT Treatment Interventions DME instruction;Gait training;Therapeutic activities;Therapeutic exercise;Patient/family education;Balance training;Functional mobility training    PT Goals (Current goals can be found in the Care Plan section)  Acute Rehab PT Goals Patient Stated Goal: none  stated PT Goal Formulation: With patient Time For Goal Achievement: 08/16/19    Frequency Min 3X/week   Barriers to discharge        Co-evaluation               AM-PAC PT "6 Clicks" Mobility  Outcome Measure Help needed turning from your back to your side while in a flat bed without using bedrails?: A Little Help needed moving from lying on your back to sitting on the side of a flat bed without using bedrails?: A Little Help needed moving to and from a bed to a chair (including a wheelchair)?: A Little Help needed standing up from a chair using your arms (e.g., wheelchair or bedside chair)?: A  Little Help needed to walk in hospital room?: A Little Help needed climbing 3-5 steps with a railing? : A Little 6 Click Score: 18    End of Session   Activity Tolerance: Patient tolerated treatment well Patient left: in bed;with call bell/phone within reach;with bed alarm set   PT Visit Diagnosis: Unsteadiness on feet (R26.81)    Time: 1450-1505 PT Time Calculation (min) (ACUTE ONLY): 15 min   Charges:   PT Evaluation $PT Eval Moderate Complexity: 1 Mod            Yeudiel Mateo P, PT Acute Rehabilitation

## 2019-08-02 NOTE — ED Notes (Signed)
PT hearing aids placed in specimen bag and labbled and placed in pt belonging bad with other belongings and paperwork for transport.

## 2019-08-02 NOTE — TOC Progression Note (Signed)
Transition of Care [facility]) - Progression Note    Patient Details  Name: Ranae Kidwell MRN: [id number] Date of Birth: Apr 12, 1941  Transition of Care [facility]) CM/SW Contact  Thang Flett, Juliann Pulse, RN Phone Number: 08/02/2019, 4:06 PM  Clinical Narrative: HHPT can be provided @ ALF.      Expected Discharge Plan: Assisted Living Barriers to Discharge: Continued Medical Work up  Expected Discharge Plan and Services Expected Discharge Plan: Assisted Living   Discharge Planning Services: CM Consult   Living arrangements for the past 2 months: Assisted Living Facility                                       Social Determinants of Health (SDOH) Interventions    Readmission Risk Interventions No flowsheet data found.

## 2019-08-02 NOTE — NC FL2 (Signed)
[place] LEVEL OF CARE SCREENING TOOL     IDENTIFICATION  Patient Name: Chloe Fletcher Birthdate: Jul 14, 1940 Sex: female Admission Date (Current Location): 08/01/2019  [facility] [place] and [place] Number:  Herbalist and Address:  [facility],  [address] [place], [place]      Provider Number: [id number]  Attending Physician Name and Address:  Shawna Clamp, MD  Relative Name and Phone Number:  Daisy Lazar [phone]    Current Level of Care: Hospital Recommended Level of Care: [place] Prior Approval Number:    Date Approved/Denied:   PASRR Number:    Discharge Plan: Other (Comment)(Return back to [facility])    Current Diagnoses: Patient Active Problem List   Diagnosis Date Noted  . Hypertensive crisis 08/02/2019  . Acute cystitis 08/02/2019  . Diabetes mellitus without complication ([place])   . CKD (chronic kidney disease) stage 3, GFR 30-59 ml/min   . COPD (chronic obstructive pulmonary disease) (HCC)     Orientation RESPIRATION BLADDER Height & Weight     Self, Time, Situation, Place  Normal Continent Weight: 55.8 kg Height:  5\' 2"  (157.5 cm)  BEHAVIORAL SYMPTOMS/MOOD NEUROLOGICAL BOWEL NUTRITION STATUS      Continent Diet(CHO MOD)  AMBULATORY STATUS COMMUNICATION OF NEEDS Skin   Limited Assist Verbally Normal                       Personal Care Assistance Level of Assistance  Bathing, Feeding, Dressing Bathing Assistance: Limited assistance Feeding assistance: Limited assistance Dressing Assistance: Limited assistance     Functional Limitations Info  Sight, Hearing, Speech Sight Info: Adequate Hearing Info: Impaired(Bilateral Hearing aids) Speech Info: Adequate    SPECIAL CARE FACTORS FREQUENCY  PT (By licensed PT), OT (By licensed OT)     PT Frequency: 5x week OT Frequency: 5x week            Contractures Contractures Info: Not present    Additional Factors Info  Code  Status, Allergies, Insulin Sliding Scale Code Status Info: Full code Allergies Info: NKA   Insulin Sliding Scale Info: SSI       Current Medications (08/02/2019):  This is the current hospital active medication list Current Facility-Administered Medications  Medication Dose Route Frequency Provider Last Rate Last Admin  . acetaminophen (TYLENOL) tablet 650 mg  650 mg Oral Q6H PRN Howerter, Justin B, DO       Or  . acetaminophen (TYLENOL) suppository 650 mg  650 mg Rectal Q6H PRN Howerter, Justin B, DO      . albuterol (PROVENTIL) (2.5 MG/3ML) 0.083% nebulizer solution 3 mL  3 mL Inhalation Q4H PRN Howerter, Justin B, DO      . amLODipine (NORVASC) tablet 5 mg  5 mg Oral Daily Howerter, Justin B, DO   5 mg at 08/02/19 0203  . cefTRIAXone (ROCEPHIN) 2 g in sodium chloride 0.9 % 100 mL IVPB  2 g Intravenous Q0600 Howerter, Justin B, DO      . clopidogrel (PLAVIX) tablet 75 mg  75 mg Oral Daily Howerter, Justin B, DO   75 mg at 08/02/19 0828  . feeding supplement (ENSURE ENLIVE) (ENSURE ENLIVE) liquid 237 mL  237 mL Oral BID BM Howerter, Justin B, DO   237 mL at 08/02/19 0833  . fenofibrate tablet 54 mg  54 mg Oral Daily Howerter, Justin B, DO   54 mg at 08/02/19 0829  . fluticasone furoate-vilanterol (BREO ELLIPTA) 100-25 MCG/INH  1 puff  1 puff Inhalation Daily Howerter, Justin B, DO   1 puff at 08/02/19 0723  . insulin aspart (novoLOG) injection 0-9 Units  0-9 Units Subcutaneous TID WC Howerter, Justin B, DO      . labetalol (NORMODYNE) injection 10 mg  10 mg Intravenous Q2H PRN Howerter, Justin B, DO   10 mg at 08/02/19 0259  . losartan (COZAAR) tablet 12.5 mg  12.5 mg Oral Daily Howerter, Justin B, DO   12.5 mg at 08/02/19 0829  . metoprolol tartrate (LOPRESSOR) tablet 50 mg  50 mg Oral BID Howerter, Justin B, DO   50 mg at 08/02/19 0829  . nicotine (NICODERM CQ - dosed in mg/24 hours) patch 14 mg  14 mg Transdermal Daily PRN Howerter, Justin B, DO      . potassium chloride (KLOR-CON) packet  40 mEq  40 mEq Oral Once Shawna Clamp, MD      . sodium chloride flush (NS) 0.9 % injection 3 mL  3 mL Intravenous Q12H Howerter, Justin B, DO   3 mL at 08/02/19 [id number]     Discharge Medications: Please see discharge summary for a list of discharge medications.  Relevant Imaging Results:  Relevant Lab Results:   Additional Information SS#[id number], Juliann Pulse, RN

## 2019-08-02 NOTE — H&P (Addendum)
History and Physical    PLEASE NOTE THAT DRAGON DICTATION SOFTWARE WAS USED IN THE CONSTRUCTION OF THIS NOTE.   Chloe Fletcher [id number] DOB: 1941/05/24 DOA: 08/01/2019  PCP: Dulce Sellar, MD Patient coming from: SNF  I have personally briefly reviewed patient's old medical records in [place]  Chief Complaint: Elevated blood pressure  HPI: Chloe Fletcher is a 79 y.o. female with medical history significant for dementia, hypertension, type 2 diabetes mellitus, stage III chronic kidney disease, chronic tobacco abuse, COPD, who is admitted to [facility] on 08/02/2019 with hypertensive crisis after presenting from SNF to [facility] Emergency Department for evaluation of elevated blood pressure.  In the context of the patient's dementia, the following history is provided by the patient's son [name]) as well as via my discussions with the emergency department physician and via chart review.  In the context of a history of hypertension and stage III chronic kidney disease, the patient reportedly underwent several adjustments to outpatient antihypertensive regimen via her nephrologist, Dr. Royce Macadamia, over the last few weeks. Over that timeframe, Norvasc and HCTZ were both reportedly completely discontinued, while her dose of losartan was reduced by half, while reportedly continuing current dose of metoprolol tartrate. Per chart review, typical systolic blood pressures leading up to these adjustments to outpatient hypertensive regimen appear to be in the 140s mmHg.   On the morning of 08/01/2019, SNF staff reportedly noted the patient to exhibit elevated systolic blood pressures greater than 200 mmHg, prompting the patient to be brought to [facility] long hospital for further evaluation. This was reportedly not associated with any acute focal neurologic deficits, and the patient reportedly was at her baseline mental status over the course of the last month. SNF staff conveyed that the  patient has not recently missed any of her outpatient antihypertensive medications. The patient denies any recent chest pain, shortness of breath, palpitations, diaphoresis, nausea, vomiting. Over the last 3 days, she does however report sharp, intermittent left flank pain radiating into the left groin, which has been associated with dysuria in the absence of any gross hematuria. SNF denies that the patient is experiencing recent trauma. Not associated with any recent subjective fever, chills, rigors, or generalized myalgias.  Denies any recent headache, neck stiffness, rhinitis, rhinorrhea, sore throat, diarrhea, or rash. No recent traveling or known COVID-19 exposures.   Per discussions with the patient's son, [name], he acknowledges that the patient possesses a diagnosis of dementia, and is noted mild increase in the patient's degree of forgetfulness over the course of the last 1 month. He denies any abrupt change in the patient's mental status over the last few days.     ED Course:  Vital signs in the ED were notable for the following: Temperature max 97.7; heart rate 64-78; respiratory rate 14-20, and oxygen saturation 96 to 99% on room air. Initial blood pressure noted to be 243/65, which transiently decreased to 180/55 following administration of labetalol 20 mg IV x1. Sequently, blood pressure elevated to 218/60, before decreasing to 165/106 following hydralazine 10 mg IV x1, with most recent blood pressure noted to be 229/63.  Labs were notable for the following: CMP notable for sodium 135, potassium 3.5, bicarbonate 27, creatinine 1.38 relative to most recent prior creatinine data point of 1.80 on 06/18/2019; liver enzymes found to be within normal limits; lipase 36. Urinalysis was notable for 11-20 white blood cells, moderate leukocyte esterase, and no squamous epithelial cells. CBC notable for white blood cell count  of 7700. In anticipation of admission, nasopharyngeal COVID-19 PCR was obtained,  with result currently pending.  As additional evaluation of patient's hypertensive crisis, noncontrast CT of the head showed no evidence of acute intracranial process, including no evidence of acute intracranial hemorrhage or acute ischemic infarct. CT abdomen/pelvis without contrast showed evidence of multiple 3 mm nonobstructing renal stones within the lower pole of the left kidney in the absence of any associated hydronephrosis, and otherwise showed no evidence of acute intra-abdominal process. CTA of the chest, abdomen, pelvis showed no evidence of acute pulmonary embolism, aortic dissection, or acute vascular pathology, but showed evidence of a 5 mm nonobstructing stone within the mid to lower left kidney.  While in the ED, the following were administered, in addition to the aforementioned single doses of IV hydralazine and IV labetalol: Lopressor 50 mg p.o. x1 and acetaminophen 1 g p.o. x1.     Review of Systems: As per HPI otherwise 10 point review of systems negative.   Past Medical History:  Diagnosis Date  . Back pain   . Carotid artery stenosis   . Cognitive decline   . Diabetes mellitus without complication ([place])   . Hypertension   . Renal disorder     History reviewed. No pertinent surgical history.  Social History:  reports that she has been smoking. She has never used smokeless tobacco. She reports previous alcohol use. She reports previous drug use.   No Known Allergies  No family history on file.  Family history reviewed and not pertinent.    Prior to Admission medications   Medication Sig Start Date End Date Taking? Authorizing Provider  acetaminophen (TYLENOL) 500 MG tablet Take 500 mg by mouth every 8 (eight) hours as needed for mild pain or headache.   Yes [provider]  cetirizine (ZYRTEC) 10 MG tablet Take 10 mg by mouth daily as needed for allergies.   Yes [provider]  Cholecalciferol (VITAMIN D3) 25 MCG (1000 UT) CAPS Take 2,000  Units by mouth daily.   Yes [provider]  clopidogrel (PLAVIX) 75 MG tablet Take 75 mg by mouth daily.   Yes [provider]  fenofibrate (TRICOR) 48 MG tablet Take 48 mg by mouth daily.   Yes [provider]  fluticasone furoate-vilanterol (BREO ELLIPTA) 100-25 MCG/INH AEPB Inhale 1 puff into the lungs daily.   Yes [provider]  Glucosamine-Chondroit-Vit C-Mn (GLUCOSAMINE 1500 COMPLEX PO) Take 1 tablet by mouth 2 (two) times daily.   Yes [provider]  losartan (COZAAR) 25 MG tablet Take 12.5 mg by mouth daily.   Yes [provider]  metFORMIN (GLUCOPHAGE) 500 MG tablet Take 500 mg by mouth daily with breakfast.   Yes [provider]  metoprolol tartrate (LOPRESSOR) 50 MG tablet Take 50 mg by mouth 2 (two) times daily.   Yes [provider]  Omega-3 Fatty Acids (FISH OIL) 1000 MG CAPS Take 1,000 mg by mouth daily.   Yes [provider]     Objective    Physical Exam: Vitals:   08/01/19 2218 08/01/19 2300 08/01/19 2322 08/02/19 0015  BP: (!) 200/60 (!) 165/106 (!) 208/68 (!) 239/72  Pulse: 75 76 75 73  Resp: 20 (!) 21 18   Temp:      TempSrc:      SpO2: 95% 100% 96% 99%  Weight:      Height:        General: appears to be stated age; alert, but not oriented  to person, place, or time. Skin: warm, dry, no rash Head:  AT/[place] Eyes:  PEARL b/l, EOMI Mouth:  Oral mucosa membranes appear moist, normal dentition Neck: supple; trachea midline Heart:  RRR; did not appreciate any M/R/G Lungs: CTAB, did not appreciate any wheezes, rales, or rhonchi Abdomen: + BS; soft, ND, NT Vascular: 2+ pedal pulses b/l; 2+ radial pulses b/l Extremities: no peripheral edema, no muscle wasting Neuro: In the setting of the patient's current mental status and associated inability to follow instructions, unable to perform full neurologic exam at this time.  As such, assessment of strength, sensation, and cranial nerves is  limited at this time. Patient noted to spontaneously move all 4 extremities.    Labs on Admission: I have personally reviewed following labs and imaging studies  CBC: Recent Labs  Lab 08/01/19 1508  WBC 7.7  HGB 11.2*  HCT 33.9*  MCV 97.4  PLT [id number]   Basic Metabolic Panel: Recent Labs  Lab 08/01/19 1508  NA 135  K 3.5  CL 100  CO2 27  GLUCOSE 134*  BUN 22  CREATININE 1.38*  CALCIUM 9.2   GFR: Estimated Creatinine Clearance: 26.6 mL/min (A) (by C-G formula based on SCr of 1.38 mg/dL (H)). Liver Function Tests: Recent Labs  Lab 08/01/19 1508  AST 17  ALT 13  ALKPHOS 49  BILITOT 0.9  PROT 6.5  ALBUMIN 3.4*   Recent Labs  Lab 08/01/19 1508  LIPASE 36   No results for input(s): AMMONIA in the last 168 hours. Coagulation Profile: No results for input(s): INR, PROTIME in the last 168 hours. Cardiac Enzymes: No results for input(s): CKTOTAL, CKMB, CKMBINDEX, TROPONINI in the last 168 hours. BNP (last 3 results) No results for input(s): PROBNP in the last 8760 hours. HbA1C: No results for input(s): HGBA1C in the last 72 hours. CBG: No results for input(s): GLUCAP in the last 168 hours. Lipid Profile: No results for input(s): CHOL, HDL, LDLCALC, TRIG, CHOLHDL, LDLDIRECT in the last 72 hours. Thyroid Function Tests: No results for input(s): TSH, T4TOTAL, FREET4, T3FREE, THYROIDAB in the last 72 hours. Anemia Panel: No results for input(s): VITAMINB12, FOLATE, FERRITIN, TIBC, IRON, RETICCTPCT in the last 72 hours. Urine analysis:    Component Value Date/Time   COLORURINE YELLOW 08/01/2019 2007   APPEARANCEUR CLEAR 08/01/2019 2007   LABSPEC 1.030 08/01/2019 2007   PHURINE 6.0 08/01/2019 2007   GLUCOSEU NEGATIVE 08/01/2019 2007   HGBUR NEGATIVE 08/01/2019 2007   [place] NEGATIVE 08/01/2019 2007   [place] NEGATIVE 08/01/2019 2007   PROTEINUR >=300 (A) 08/01/2019 2007   NITRITE NEGATIVE 08/01/2019 2007   LEUKOCYTESUR MODERATE (A) 08/01/2019 2007     Radiological Exams on Admission: CT Head Wo Contrast  Result Date: 08/01/2019 CLINICAL DATA:  Elevated blood pressure left-sided pain. EXAM: CT HEAD WITHOUT CONTRAST TECHNIQUE: Contiguous axial images were obtained from the base of the skull through the vertex without intravenous contrast. COMPARISON:  January 01, 2017 FINDINGS: Brain: There is moderate severity cerebral atrophy with widening of the extra-axial spaces and ventricular dilatation. There are areas of decreased attenuation within the white matter tracts of the supratentorial brain, consistent with microvascular disease changes. 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 Vascular: No hyperdense vessel or unexpected calcification. Skull: Normal. Negative for fracture or focal lesion. Sinuses/Orbits: No acute finding. Other: None. IMPRESSION: No acute intracranial pathology. Electronically Signed   By: Virgina Norfolk M.D.   On: 08/01/2019 19:42   CT Renal Stone Study  Result Date:  08/01/2019 CLINICAL DATA:  Flank pain. EXAM: CT ABDOMEN AND PELVIS WITHOUT CONTRAST TECHNIQUE: Multidetector CT imaging of the abdomen and pelvis was performed following the standard protocol without IV contrast. COMPARISON:  January 01, 2017 FINDINGS: Lower chest: No acute abnormality. Hepatobiliary: No focal liver abnormality is seen. No gallstones, gallbladder wall thickening, or biliary dilatation. Pancreas: Unremarkable. No pancreatic ductal dilatation or surrounding inflammatory changes. Spleen: Normal in size without focal abnormality. Adrenals/Urinary Tract: Adrenal glands are unremarkable. Kidneys are normal in size. A 1.0 cm cyst is seen along the posterior aspect of the mid right kidney. Adjacent 3 mm nonobstructing renal stones are seen within the lower pole of the left kidney. Bladder is unremarkable. Stomach/Bowel: Stomach is within normal limits. Appendix appears normal. No evidence of bowel wall thickening, distention, or inflammatory  changes. Vascular/Lymphatic: Marked severity aortic atherosclerosis. No enlarged abdominal or pelvic lymph nodes. Reproductive: Status post hysterectomy. No adnexal masses. Other: No abdominal wall hernia or abnormality. No abdominopelvic ascites. Musculoskeletal: Multilevel degenerative changes seen throughout the lumbar spine. IMPRESSION: 1. Adjacent 3 mm nonobstructing renal stones within the left kidney. 2. Small right renal cyst. Aortic Atherosclerosis (ICD10-I70.0). Electronically Signed   By: Virgina Norfolk M.D.   On: 08/01/2019 17:07   CT Angio Chest/Abd/Pel for Dissection W and/or Wo Contrast  Result Date: 08/01/2019 CLINICAL DATA:  Elevated blood pressure left-sided pain. EXAM: CT ANGIOGRAPHY CHEST, ABDOMEN AND PELVIS TECHNIQUE: Multidetector CT imaging through the chest, abdomen and pelvis was performed using the standard protocol during bolus administration of intravenous contrast. Multiplanar reconstructed images and MIPs were obtained and reviewed to evaluate the vascular anatomy. CONTRAST:  68mL OMNIPAQUE IOHEXOL 350 MG/ML SOLN COMPARISON:  Chest, abdomen and pelvis CT, dated January 01, 2017, is available for comparison. FINDINGS: CTA CHEST FINDINGS Cardiovascular: There is stable marked severity calcification and atherosclerosis seen throughout the thoracic aorta. Satisfactory opacification of the pulmonary arteries to the segmental level. No evidence of pulmonary embolism. Normal heart size. No pericardial effusion. Mediastinum/Nodes: No enlarged mediastinal, hilar, or axillary lymph nodes. A 6 mm cyst is seen within the left lobe of the thyroid gland. Lungs/Pleura: There is mild biapical scarring and/or atelectasis. There is no evidence of a pleural effusion or pneumothorax. Musculoskeletal: Multilevel degenerative changes seen throughout the thoracic spine. Review of the MIP images confirms the above findings. CTA ABDOMEN AND PELVIS FINDINGS VASCULAR Aorta: Marked severity calcification and  atherosclerosis without evidence of aneurysmal dilatation or dissection. Celiac: Mild calcification without evidence of aneurysmal dilatation or dissection. SMA: Patent without evidence of aneurysm, dissection, vasculitis or significant stenosis. Renals: Mild calcification without evidence of aneurysmal dilatation or dissection. IMA: Patent without evidence of aneurysm, dissection, vasculitis or significant stenosis. Inflow: Marked severity calcification without evidence of aneurysmal dilatation or dissection Veins: No obvious venous abnormality within the limitations of this arterial phase study. Review of the MIP images confirms the above findings. NON-VASCULAR Hepatobiliary: No focal liver abnormality is seen. No gallstones, gallbladder wall thickening, or biliary dilatation. Pancreas: Unremarkable. No pancreatic ductal dilatation or surrounding inflammatory changes. Spleen: Normal in size without focal abnormality. Adrenals/Urinary Tract: Adrenal glands are unremarkable. Kidneys are normal in size without hydronephrosis. A stable 5 mm non obstructing renal stone is seen within the mid to lower left kidney. A stable 1.0 cm cyst is seen along the posteromedial aspect of the mid right kidney. Bladder is unremarkable. Stomach/Bowel: Stomach is within normal limits. Appendix appears normal. No evidence of bowel wall thickening, distention, or inflammatory changes. Lymphatic: No enlarged abdominal or pelvic  lymph nodes. Reproductive: Status post hysterectomy. No adnexal masses. Other: No abdominal wall hernia or abnormality. No abdominopelvic ascites. Musculoskeletal: Multilevel degenerative changes seen throughout the lumbar spine. This is most prominent at the levels of L4-L5 and L5-S1. Review of the MIP images confirms the above findings. IMPRESSION: 1. No evidence of pulmonary embolus, aortic dissection or other acute vascular pathology. 2. No acute or active cardiopulmonary disease. 3. Marked severity  calcification and atherosclerosis involving the thoracic and abdominal aorta which is stable in appearance when compared to the prior study dated January 01, 2017. 4. 5 mm non obstructing renal stone within the mid to lower left kidney. 5. Multilevel degenerative changes of the thoracic and lumbar spine. Electronically Signed   By: Virgina Norfolk M.D.   On: 08/01/2019 19:40    Assessment/Plan   Chloe Fletcher is a 79 y.o. female with medical history significant for dementia, hypertension, type 2 diabetes mellitus, stage III chronic kidney disease, chronic tobacco abuse, COPD, who is admitted to [facility] on 08/02/2019 with hypertensive crisis after presenting from SNF to [facility] Emergency Department for evaluation of elevated blood pressure.   Principal Problem:   Hypertensive crisis Active Problems:   Acute cystitis   Diabetes mellitus without complication (HCC)   CKD (chronic kidney disease) stage 3, GFR 30-59 ml/min   COPD (chronic obstructive pulmonary disease) ([place])   #) Hypertensive crisis: Systolic blood pressures noted by SNF staff on the morning of 08/01/19 to be greater than 200 mmHg, while presenting systolic blood pressure upon arrival at [facility] long emergency department noted to be 243/65, all of which appears to be relative to patient's baseline systolic blood pressures in the 140s. Suspect some contribution from recent modifications to outpatient antihypertensive regimen, which included discontinuation of Norvasc and HCTZ as well as dose reduction of losartan. Per patient's son, presenting mental status is associated no acute change. While unable to perform full neurologic assessment in the context of patient's dementia, acute ischemic stroke, while in the differential, is felt to be less likely given that the patient was noted to spontaneously be moving all 4 extremities. At this time, laboratory evaluation reveals no evidence of endorgan damage. In terms of goal  reduction of systolic blood pressure over the 24 hours following the first inclination of acutely elevated blood pressure on the morning of 08/01/2019, will strive for a 15 to [id number] systolic blood pressure reduction, which roughly equates to a goal systolic range of [id number] mmHg. Of note, CTA of the chest, abdomen, and pelvis showed no evidence of aortic dissection or acute vascular pathology, while presenting noncontrast CT head showed no evidence of acute intracranial process.     Plan: Inpatient pharmacy consult placed for assistance with accurate outpatient medication reconciliation. As Norvasc was reportedly discontinued over the last few weeks, will resume Norvasc at a dose of 5 mg p.o. daily, first dose now. Continue recently reduced dose of losartan and continue home metoprolol tartrate 50 mg p.o. twice daily. In the setting of presenting left flank discomfort with multiple nonobstructing renal stones within the lower pole of the left kidney, will also provide a single dose of Flomax and strive for improved pain control relating to left flank discomfort, with both these interventions potentially rendering improvement in antihypertensive control. monitor on telemetry. As needed IV labetalol for systolic blood pressure greater than 180 mmHg. monitor every 2 hours blood pressure x4 occurrences before transitioning to every 4 hour blood pressure monitoring. Every 4 hours neuro checks.  will continue to monitor for laboratory evidence of end organ damage with repeat CMP in the morning.  Monitor strict I's and O's.  Counseled patient on importance of smoking discontinuation, as further described below. Will also check TSH and UDS. While acute ischemic infarct appears to be less likely, will provide full dose aspirin x1 now.      #) Urinary tract infection: Diagnosis on the basis of presenting urinalysis showing borderline significant pyuria, with 11-20 white blood cells noted in addition to the presence of  moderate leukocyte Estrace in the absence of any squamous epithelial cells. This is in conjunction with the patient's report of 2 to 3 days of dysuria. In the absence of any SIRS criteria, presentation is not considered sepsis at this time. Of note, no perinephric stranding identified on imaging performed this evening.  Plan: Collect blood cultures x2, following which will start Rocephin. Follow for results of urine culture/sensitivities. Repeat CBC in the morning.      #) Nonobstructing left renal stones: Multiple nonobstructing left renal stones in the reported range of 91mm - 48mm reportedly noted within the mid to lower left kidney per imaging performed this evening, although no ureteral stones noted. No radiographic evidence to suggest associated hydronephrosis. In the setting of the patient's report of to 3 days of left flank discomfort, it is possible that she recently passed a kidney stone. Considered prn Toradol to help manage left flank discomfort, but ultimately decided against this form of analgesia in the context of stage III chronic kidney disease.   Plan: As needed acetaminophen. Monitor strict I's and O's. Repeat BMP in the morning. Flomax x1 dose now, as further described above.      #) Type 2 Diabetes Mellitus: presenting blood sugar noted to be 134. Home insulin regimen: None. Home oral hypoglycemic agents: Metformin.    Plan: accuchecks QAC and HS with low dose sliding scale insulin. Check hemoglobin A1c. Hold home Metformin during this hospitalization.      #) Stage III chronic kidney disease: Documented history of such, although chart review yields very few prior creatinine data points from which to establish a baseline. Most recent prior creatinine found to be 1.80 on 06/18/2019 per review of Care Everywhere. Labs performed this evening reflect serum creatinine of 1.3. reportedly follows with Dr. Royce Macadamia as her outpatient nephrologist.  Plan: Monitor strict I's and O's  and daily weights. Attempt to avoid nephrotoxic agents. Close monitoring of ensuing renal function, particularly the setting of presenting hypertensive crisis. repeat BMP in the morning.       #) COPD: While I have encountered documentation of a history of COPD in the setting of a long smoking history, I have not yet encountered results of pulmonary function test per chart review. Outpatient respiratory regimen appears to consist of Breo Ellipta. No evidence of acute COPD exacerbation at this time.  Plan: Continue home Breo Ellipta. As needed albuterol inhaler. As needed supplemental oxygen order to maintain oxygen saturations greater than or equal to 92%. Counseled the patient on the importance of complete smoking discontinuation.      #) Chronic tobacco abuse: Per chart review, it appears that the patient is a current smoker, although the duration and intensity of her smoking is currently clear to me.   Plan: I attempted to counsel the patient on the importance of complete smoking discontinuation. Have also ordered a as needed nicotine patch for use during this hospitalization.     DVT prophylaxis: SCDs Code Status: Full  code Family Communication: Case was discussed with the patient's son [name]) Disposition Plan: Per Rounding Team Consults called: none  Admission status: Observation; med telemetry.   PLEASE NOTE THAT DRAGON DICTATION SOFTWARE WAS USED IN THE CONSTRUCTION OF THIS NOTE.   [place] [facility] Pager [phone] From [place].   Otherwise, please contact night-coverage  [email or web address] Password [email or web address]  08/02/2019, 12:38 AM

## 2019-08-02 NOTE — Care Plan (Signed)
This 79 years old assisted-living resident with advanced dementia sent from assisted living with uncontrolled blood pressure.  CT head unremarkable, CT chest unremarkable, UA positive for UTI, started on antibiotics.  Continue ceftriaxone, follow-up urine cultures.  Patient still appears confused, spoke with son Mr. Jackline Hanlin, He reports patient has history of dementia but has recent significant decline in her mental status after she came back from [place] 1 month ago, will continue to monitor, if no improvements by tomorrow. we will consider neurology consult and MRI brain. Her BP has improved with adjustment in her BP medications.

## 2019-08-02 NOTE — TOC Initial Note (Signed)
Transition of Care [facility]) - Initial/Assessment Note    Patient Details  Name: Chloe Fletcher MRN: [id number] Date of Birth: 11-09-1940  [facility]) CM/SW Contact:    Dessa Phi, RN Phone Number: 08/02/2019, 11:34 AM  Clinical Narrative: Patient currently confused.Spoke to dtr Laura-d/c plan to return back to [facility]. PT cons-await recc.                  Expected Discharge Plan: Assisted Living Barriers to Discharge: Continued Medical Work up   Patient Goals and CMS Choice Patient states their goals for this hospitalization and ongoing recovery are:: return to [facility] CMS Medicare.gov Compare Post Acute Care list provided to:: Patient Represenative (must comment) Choice offered to / list presented to : Adult Children  Expected Discharge Plan and Services Expected Discharge Plan: Assisted Living   Discharge Planning Services: CM Consult   Living arrangements for the past 2 months: [place]                                      Prior Living Arrangements/Services Living arrangements for the past 2 months: [place] Lives with:: Facility Resident   Do you feel safe going back to the place where you live?: Yes      Need for Family Participation in Patient Care: No (Comment) Care giver support system in place?: Yes (comment)   Criminal Activity/Legal Involvement Pertinent to Current Situation/Hospitalization: No - Comment as needed  Activities of Daily Living Home Assistive Devices/Equipment: Shower chair with back ADL Screening (condition at time of admission) Patient's cognitive ability adequate to safely complete daily activities?: No Is the patient deaf or have difficulty hearing?: Yes Does the patient have difficulty seeing, even when wearing glasses/contacts?: No Does the patient have difficulty concentrating, remembering, or making decisions?: Yes Patient able to express need for assistance with  ADLs?: No Does the patient have difficulty dressing or bathing?: No Independently performs ADLs?: No Communication: Independent Dressing (OT): Needs assistance Is this a change from baseline?: Pre-admission baseline Grooming: Needs assistance Is this a change from baseline?: Pre-admission baseline Feeding: Independent Bathing: Needs assistance Is this a change from baseline?: Pre-admission baseline Toileting: Independent In/Out Bed: Needs assistance Is this a change from baseline?: Pre-admission baseline Walks in Home: Independent Does the patient have difficulty walking or climbing stairs?: Yes Weakness of Legs: None Weakness of Arms/Hands: None  Permission Sought/Granted Permission sought to share information with : Case Manager Permission granted to share information with : Yes, Verbal Permission Granted  Share Information with NAME: Case Manager  Permission granted to share info w AGENCY: [facility]  Permission granted to share info w Relationship: Daisy Lazar [id number]     Emotional Assessment Appearance:: Appears stated age Attitude/Demeanor/Rapport: Gracious Affect (typically observed): Accepting Orientation: : Oriented to Self, Oriented to Place, Oriented to  Time, Oriented to Situation Alcohol / Substance Use: Not Applicable Psych Involvement: No (comment)  Admission diagnosis:  Hypertensive crisis [I16.9] Flank pain [R10.9] Hypertensive urgency [I16.0] Hypertension, unspecified type [I10] Patient Active Problem List   Diagnosis Date Noted  . Hypertensive crisis 08/02/2019  . Acute cystitis 08/02/2019  . Diabetes mellitus without complication ([place])   . CKD (chronic kidney disease) stage 3, GFR 30-59 ml/min   . COPD (chronic obstructive pulmonary disease) ([place])    PCP:  Dulce Sellar, MD Pharmacy:  No Pharmacies  Listed    Social Determinants of Health (SDOH) Interventions    Readmission Risk Interventions No flowsheet data found.

## 2019-08-02 NOTE — Care Management Obs Status (Signed)
[facility] NOTIFICATION   Patient Details  Name: Chloe Fletcher MRN: [id number] Date of Birth: Jan 09, 1941   Medicare Observation Status Notification Given:  Yes    MahabirJuliann Pulse, RN 08/02/2019, 11:32 AM

## 2019-08-02 NOTE — Progress Notes (Signed)
Patient's son [name] updated on patient's plan of care.  [name] is aware MD will be in touch with him some time today.  Will continue to monitor.

## 2019-08-03 ENCOUNTER — Encounter (HOSPITAL_COMMUNITY): Payer: Self-pay | Admitting: Family Medicine

## 2019-08-03 ENCOUNTER — Inpatient Hospital Stay (HOSPITAL_COMMUNITY): Payer: Medicare Other

## 2019-08-03 DIAGNOSIS — I169 Hypertensive crisis, unspecified: Secondary | ICD-10-CM

## 2019-08-03 DIAGNOSIS — E119 Type 2 diabetes mellitus without complications: Secondary | ICD-10-CM

## 2019-08-03 LAB — CBC
HCT: 31 % — ABNORMAL LOW (ref 36.0–46.0)
Hemoglobin: 10.2 g/dL — ABNORMAL LOW (ref 12.0–15.0)
MCH: 32 pg (ref 26.0–34.0)
MCHC: 32.9 g/dL (ref 30.0–36.0)
MCV: 97.2 fL (ref 80.0–100.0)
Platelets: 321 10*3/uL (ref 150–400)
RBC: 3.19 MIL/uL — ABNORMAL LOW (ref 3.87–5.11)
RDW: 13.2 % (ref 11.5–15.5)
WBC: 6.3 10*3/uL (ref 4.0–10.5)
nRBC: 0 % (ref 0.0–0.2)

## 2019-08-03 LAB — COMPREHENSIVE METABOLIC PANEL
ALT: 13 U/L (ref 0–44)
AST: 14 U/L — ABNORMAL LOW (ref 15–41)
Albumin: 2.7 g/dL — ABNORMAL LOW (ref 3.5–5.0)
Alkaline Phosphatase: 42 U/L (ref 38–126)
Anion gap: 9 (ref 5–15)
BUN: 26 mg/dL — ABNORMAL HIGH (ref 8–23)
CO2: 22 mmol/L (ref 22–32)
Calcium: 9.1 mg/dL (ref 8.9–10.3)
Chloride: 104 mmol/L (ref 98–111)
Creatinine, Ser: 1.6 mg/dL — ABNORMAL HIGH (ref 0.44–1.00)
GFR calc Af Amer: 35 mL/min — ABNORMAL LOW (ref >60)
GFR calc non Af Amer: 31 mL/min — ABNORMAL LOW (ref >60)
Glucose, Bld: 114 mg/dL — ABNORMAL HIGH (ref 70–99)
Potassium: 3.9 mmol/L (ref 3.5–5.1)
Sodium: 135 mmol/L (ref 135–145)
Total Bilirubin: 0.5 mg/dL (ref 0.3–1.2)
Total Protein: 5.6 g/dL — ABNORMAL LOW (ref 6.5–8.1)

## 2019-08-03 LAB — PHOSPHORUS: Phosphorus: 4.6 mg/dL (ref 2.5–4.6)

## 2019-08-03 LAB — GLUCOSE, CAPILLARY
Glucose-Capillary: 139 mg/dL — ABNORMAL HIGH (ref 70–99)
Glucose-Capillary: 113 mg/dL — ABNORMAL HIGH (ref 70–99)
Glucose-Capillary: 97 mg/dL (ref 70–99)
Glucose-Capillary: 104 mg/dL — ABNORMAL HIGH (ref 70–99)

## 2019-08-03 LAB — URINE CULTURE: Culture: <10000 — AB

## 2019-08-03 LAB — MAGNESIUM: Magnesium: 2.1 mg/dL (ref 1.7–2.4)

## 2019-08-03 LAB — MRSA PCR SCREENING: MRSA by PCR: NEGATIVE

## 2019-08-03 LAB — VITAMIN B12: Vitamin B-12: 288 pg/mL (ref 180–914)

## 2019-08-03 IMAGING — MR MR HEAD W/O CM
5 of 7 series · 32 of 48 positions shown · non-contrast
Comparison: CT head [DATE]

CLINICAL DATA: Encephalopathy.  Altered mental status

EXAM:
MRI HEAD WITHOUT CONTRAST
TECHNIQUE: Multiplanar, multiecho pulse sequences of the brain and surrounding
structures were obtained without intravenous contrast.

[Series 3: DWI · axial · 3.0mm · 1.09mm/px · z∈[-26,+99]mm · 10 of 94 slices shown (1 of 2)]
[im 7/94]
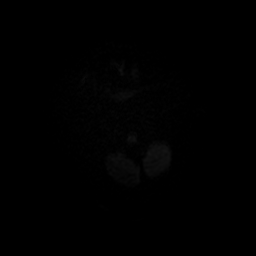
[im 13/94]
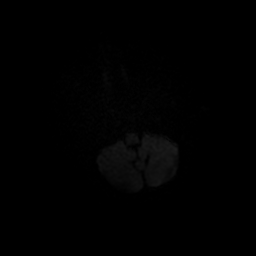
[im 19/94]
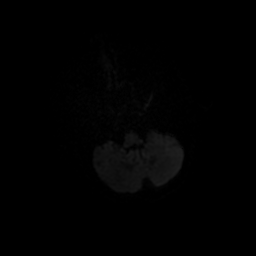
[im 32/94]
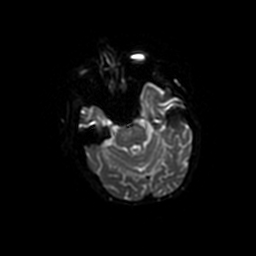
[im 44/94]
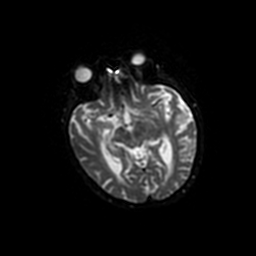
[im 50/94]
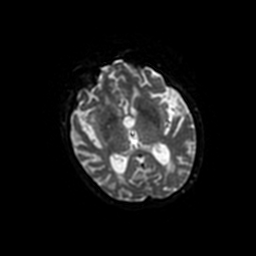
[im 56/94]
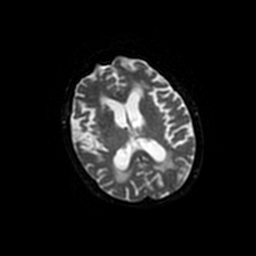
[im 69/94]
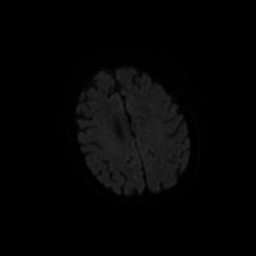
[im 81/94]
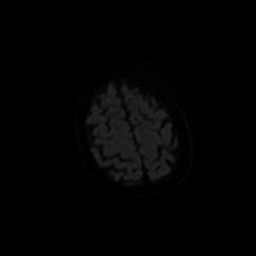
[im 94/94]
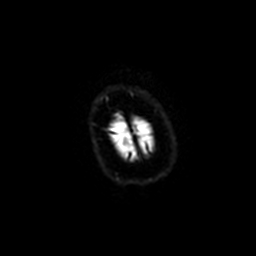

[Series 4: T1 · sagittal · 5.0mm · 0.47mm/px · 3 of 21 slices shown]
[im 1/21]
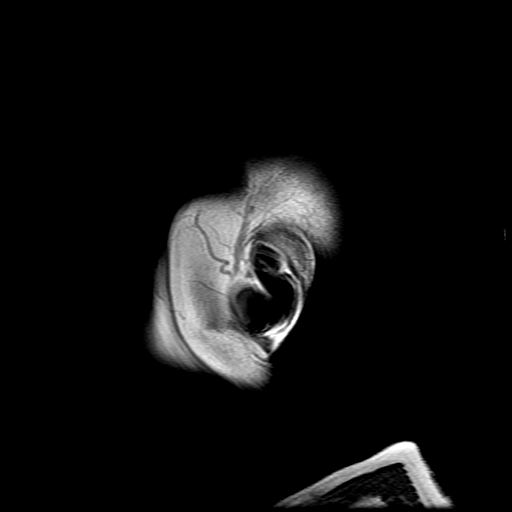
[im 7/21]
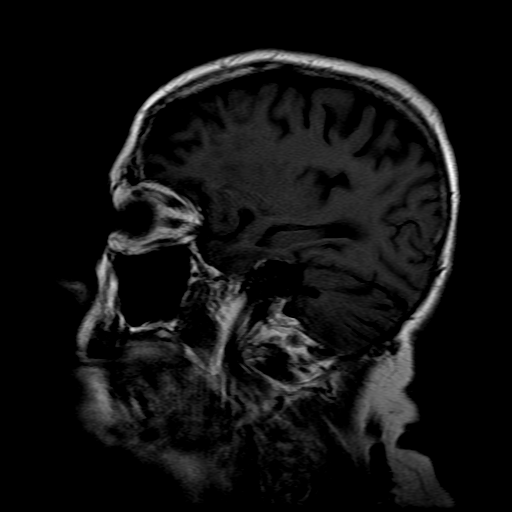
[im 14/21]
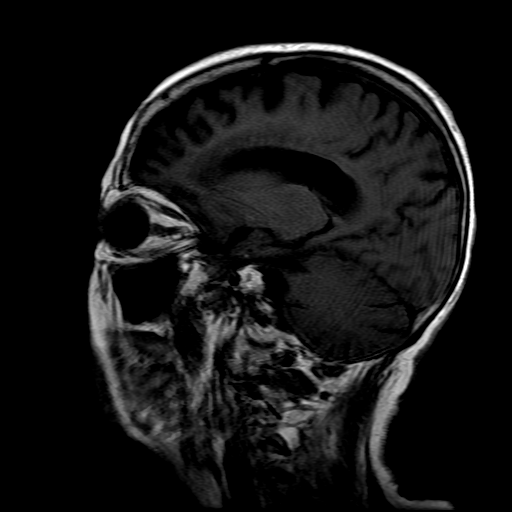

[Series 5: T2 · axial · 5.0mm · 0.43mm/px · z∈[-26,+112]mm · 5 of 25 slices shown]
[im 1/25]
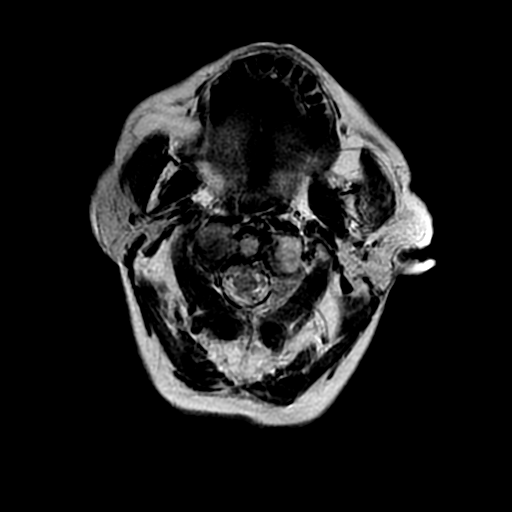
[im 7/25]
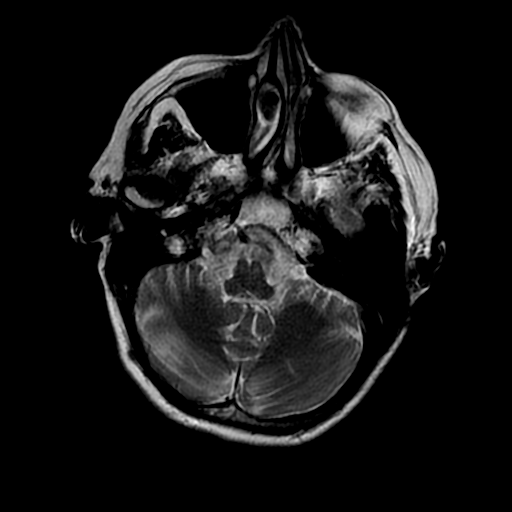
[im 13/25]
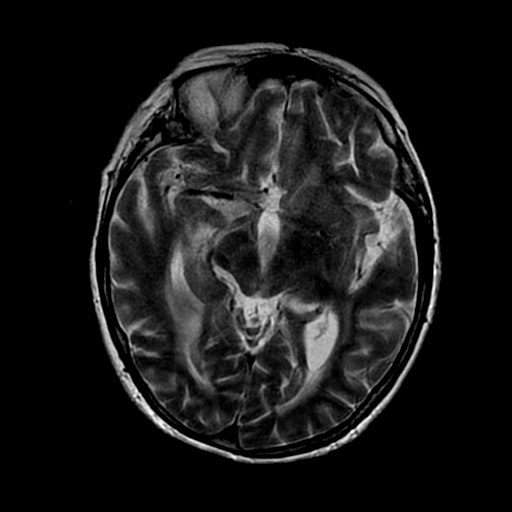
[im 19/25]
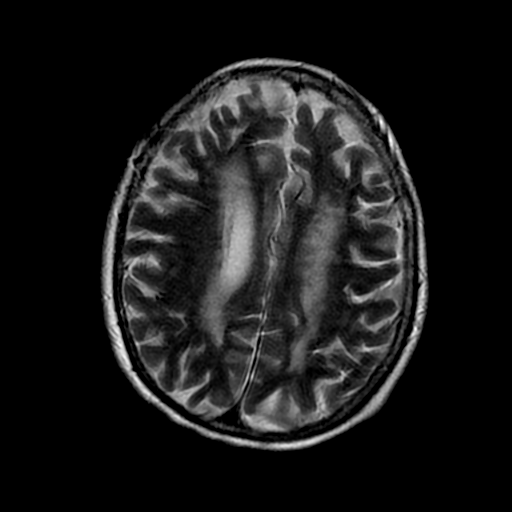
[im 25/25]
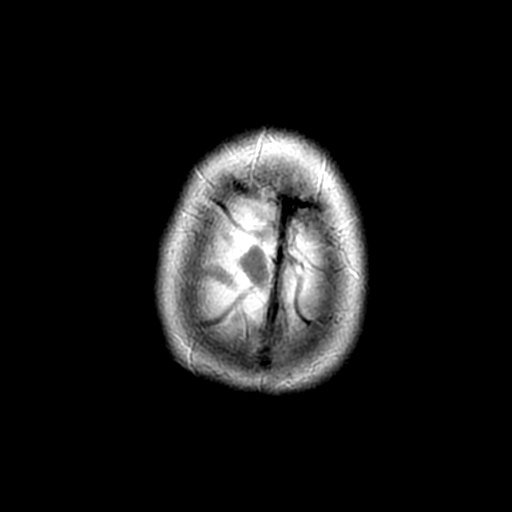

[Series 7: FLAIR · axial · 3.0mm · 0.43mm/px · z∈[-26,+112]mm · 5 of 25 slices shown]
[im 1/25]
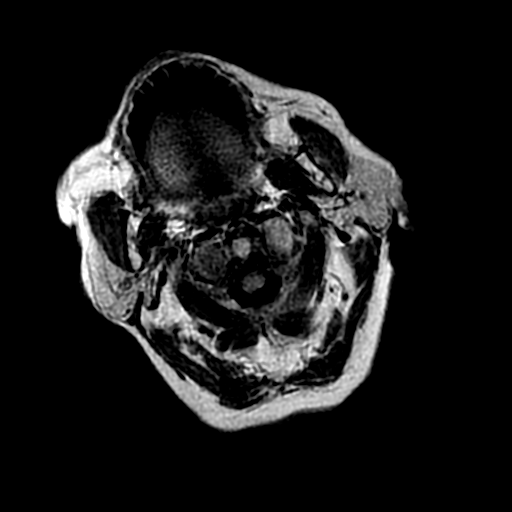
[im 7/25]
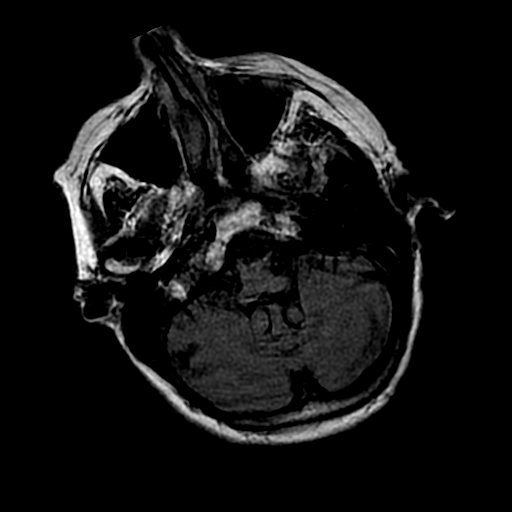
[im 13/25]
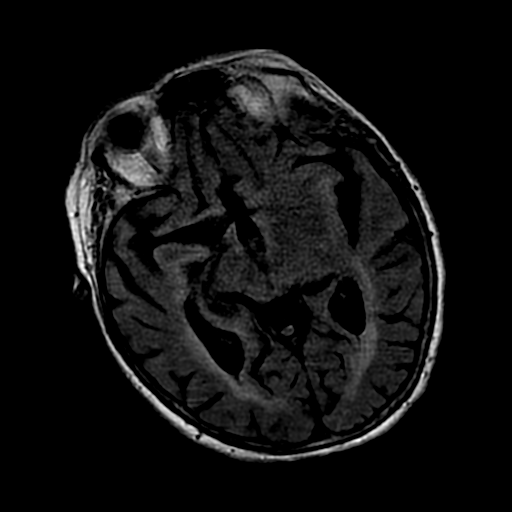
[im 19/25]
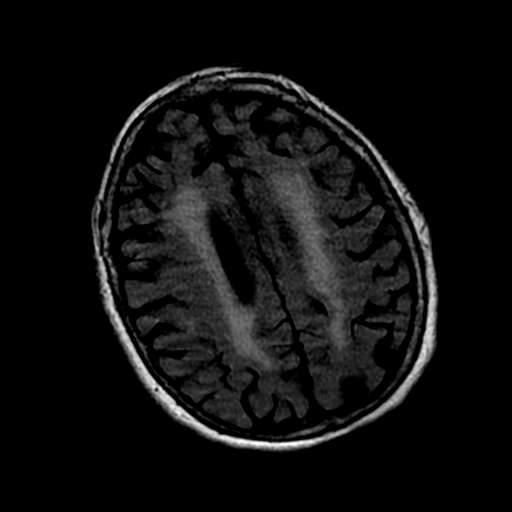
[im 25/25]
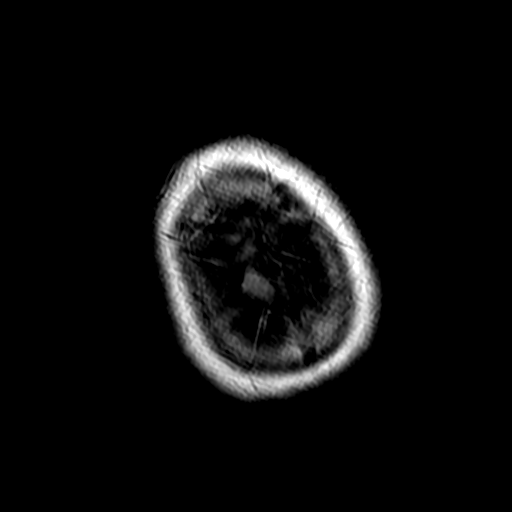

[Series 300: DWI · axial · 3.0mm · 1.09mm/px · z∈[-35,+99]mm · 9 of 47 slices shown (2 of 2)]
[im 1/47]
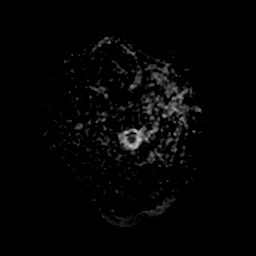
[im 6/47]
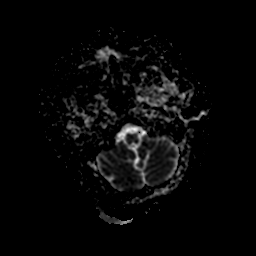
[im 12/47]
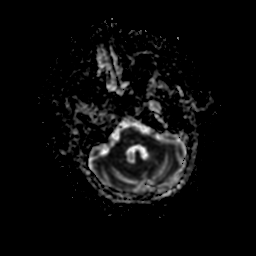
[im 18/47]
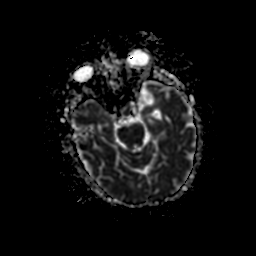
[im 24/47]
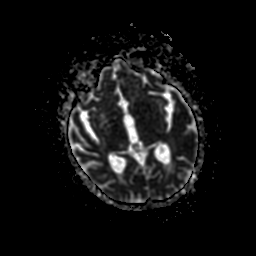
[im 29/47]
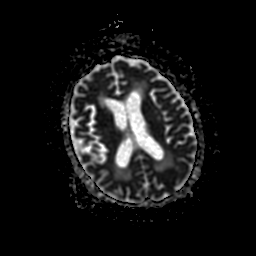
[im 35/47]
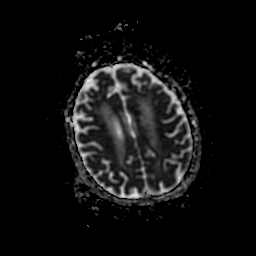
[im 41/47]
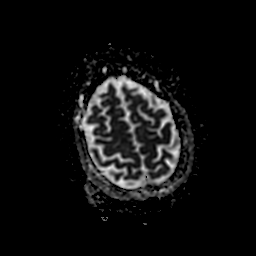
[im 47/47]
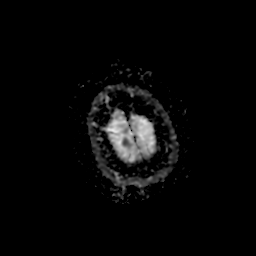

[32 of 48 positions shown; findings below may reference images not displayed]

FINDINGS: Brain: Incomplete study degraded by motion. Patient not able to
complete the examination.

3 mm acute infarct in the right mid frontal cortex.

Several small areas of intermediate signal on diffusion-weighted
imaging in the parietal lobes and left temporoparietal lobe appear
to be chronic ischemia.

Generalized atrophy, moderate. Moderate changes throughout the white
matter compatible chronic microvascular ischemia. No fluid
collection or midline shift. Chronic ischemic changes also in the
pons and cerebellum bilaterally. Chronic ischemia in the right
caudate head.

Vascular: Normal arterial flow voids

Skull and upper cervical spine: No focal skeletal lesion.

Sinuses/Orbits: Paranasal sinuses clear. Bilateral cataract
extraction

Other: None
IMPRESSION: 3 mm acute infarct right mid frontal cortex.

Atrophy and extensive chronic  ischemia.

## 2019-08-03 MED ORDER — LOSARTAN POTASSIUM 50 MG PO TABS
50.0000 mg | ORAL_TABLET | Freq: Every day | ORAL | Status: DC
  Administered 2019-08-04 – 2019-08-08 (×5): 50 mg via ORAL
  Filled 2019-08-03 (×5): qty 1

## 2019-08-03 MED ORDER — LORAZEPAM 2 MG/ML IJ SOLN
0.5000 mg | Freq: Once | INTRAMUSCULAR | Status: AC
  Administered 2019-08-03: 16:00:00 0.5 mg via INTRAVENOUS
  Filled 2019-08-03: qty 1

## 2019-08-03 MED ORDER — LORAZEPAM 0.5 MG PO TABS
0.5000 mg | ORAL_TABLET | Freq: Four times a day (QID) | ORAL | Status: DC | PRN
  Administered 2019-08-03 – 2019-08-09 (×9): 0.5 mg via ORAL
  Filled 2019-08-03 (×9): qty 1

## 2019-08-03 NOTE — Consult Note (Signed)
Requesting Physician: Dr. Dwyane Dee    Chief Complaint: Altered mental status  History obtained from: Patient and Chart    HPI:                                                                                                                                       Chloe Fletcher is a 79 y.o. female with past medical history significant for dementia, hypertension, diabetes mellitus, CKD, COPD admitted to [facility] on 08/02/19 from her SNF due to significantly elevated blood pressure greater than [id number] systolic. Patient also noted to be in altered mental status and therefore admitted.  Work-up also revealed patient had a urinary tract infection and started on antibiotics.  Neurology consulted for persistent confusion.  On assessment, patient awake and having her lunch.  Oriented to herself.   Past Medical History:  Diagnosis Date  . Back pain   . Carotid artery stenosis   . Cognitive decline   . Diabetes mellitus without complication ([place])   . Hypertension   . Renal disorder     History reviewed. No pertinent surgical history.  History reviewed. No pertinent family history. Social History:  reports that she has been smoking. She has never used smokeless tobacco. She reports previous alcohol use. She reports previous drug use.  Allergies: No Known Allergies  Medications:                                                                                                                        I reviewed home medications   ROS:                                                                                                                                     14 systems reviewed and negative except above    Examination:  General: Appears well-developed and well-nourished.  Psych: Affect appropriate to situation Eyes: No scleral injection HENT: No OP obstrucion Head: Normocephalic.   Cardiovascular: Normal rate and regular rhythm.  Respiratory: Effort normal and breath sounds normal to anterior ascultation GI: Soft.  No distension. There is no tenderness.  Skin: WDI    Neurological Examination Mental Status: Oriented to only herself, was able to name city after giving cues.  Was not able to remember month or year.  Able to state her son's name as well her daughter's name.  Has mild expressive aphasia.  Talks in tangents at times.  Able to follow simple commands.  Cranial Nerves: II: Visual fields grossly normal,  III,IV, VI: ptosis not present, extra-ocular motions intact bilaterally, pupils equal, round, reactive to light and accommodation V,VII: smile symmetric, facial light touch sensation normal bilaterally VIII: hearing normal bilaterally IX,X: uvula rises symmetrically XI: bilateral shoulder shrug XII: midline tongue extension Motor: Right : Upper extremity   5/5    Left:     Upper extremity   5/5  Lower extremity   5/5     Lower extremity   5/5 Tone and bulk:normal tone throughout; no atrophy noted Sensory: Pinprick and light touch intact throughout, bilaterally Plantars: Right: downgoing   Left: downgoing Cerebellar: normal finger-to-nose,       Lab Results: Basic Metabolic Panel: Recent Labs  Lab 08/01/19 1508 08/02/19 0516 08/03/19 0507  NA 135 133* 135  K 3.5 3.3* 3.9  CL 100 100 104  CO2 27 23 22   GLUCOSE 134* 134* 114*  BUN 22 20 26*  CREATININE 1.38* 1.47* 1.60*  CALCIUM 9.2 9.1 9.1  MG  --  1.8 2.1  PHOS  --   --  4.6    CBC: Recent Labs  Lab 08/01/19 1508 08/02/19 0516 08/03/19 0507  WBC 7.7 8.0 6.3  HGB 11.2* 10.9* 10.2*  HCT 33.9* 32.5* 31.0*  MCV 97.4 95.6 97.2  PLT 354 307 321    Coagulation Studies: No results for input(s): LABPROT, INR in the last 72 hours.  Imaging: CT Head Wo Contrast  Result Date: 08/01/2019 CLINICAL DATA:  Elevated blood pressure left-sided pain. EXAM: CT HEAD WITHOUT CONTRAST  TECHNIQUE: Contiguous axial images were obtained from the base of the skull through the vertex without intravenous contrast. COMPARISON:  January 01, 2017 FINDINGS: Brain: There is moderate severity cerebral atrophy with widening of the extra-axial spaces and ventricular dilatation. There are areas of decreased attenuation within the white matter tracts of the supratentorial brain, consistent with microvascular disease changes. 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 Vascular: No hyperdense vessel or unexpected calcification. Skull: Normal. Negative for fracture or focal lesion. Sinuses/Orbits: No acute finding. Other: None. IMPRESSION: No acute intracranial pathology. Electronically Signed   By: Virgina Norfolk M.D.   On: 08/01/2019 19:42   CT Angio Chest/Abd/Pel for Dissection W and/or Wo Contrast  Result Date: 08/01/2019 CLINICAL DATA:  Elevated blood pressure left-sided pain. EXAM: CT ANGIOGRAPHY CHEST, ABDOMEN AND PELVIS TECHNIQUE: Multidetector CT imaging through the chest, abdomen and pelvis was performed using the standard protocol during bolus administration of intravenous contrast. Multiplanar reconstructed images and MIPs were obtained and reviewed to evaluate the vascular anatomy. CONTRAST:  65mL OMNIPAQUE IOHEXOL 350 MG/ML SOLN COMPARISON:  Chest, abdomen and pelvis CT, dated January 01, 2017, is available for comparison. FINDINGS: CTA CHEST FINDINGS Cardiovascular: There is stable marked severity calcification and atherosclerosis seen throughout the thoracic aorta. Satisfactory opacification of the pulmonary arteries  to the segmental level. No evidence of pulmonary embolism. Normal heart size. No pericardial effusion. Mediastinum/Nodes: No enlarged mediastinal, hilar, or axillary lymph nodes. A 6 mm cyst is seen within the left lobe of the thyroid gland. Lungs/Pleura: There is mild biapical scarring and/or atelectasis. There is no evidence of a pleural effusion or pneumothorax.  Musculoskeletal: Multilevel degenerative changes seen throughout the thoracic spine. Review of the MIP images confirms the above findings. CTA ABDOMEN AND PELVIS FINDINGS VASCULAR Aorta: Marked severity calcification and atherosclerosis without evidence of aneurysmal dilatation or dissection. Celiac: Mild calcification without evidence of aneurysmal dilatation or dissection. SMA: Patent without evidence of aneurysm, dissection, vasculitis or significant stenosis. Renals: Mild calcification without evidence of aneurysmal dilatation or dissection. IMA: Patent without evidence of aneurysm, dissection, vasculitis or significant stenosis. Inflow: Marked severity calcification without evidence of aneurysmal dilatation or dissection Veins: No obvious venous abnormality within the limitations of this arterial phase study. Review of the MIP images confirms the above findings. NON-VASCULAR Hepatobiliary: No focal liver abnormality is seen. No gallstones, gallbladder wall thickening, or biliary dilatation. Pancreas: Unremarkable. No pancreatic ductal dilatation or surrounding inflammatory changes. Spleen: Normal in size without focal abnormality. Adrenals/Urinary Tract: Adrenal glands are unremarkable. Kidneys are normal in size without hydronephrosis. A stable 5 mm non obstructing renal stone is seen within the mid to lower left kidney. A stable 1.0 cm cyst is seen along the posteromedial aspect of the mid right kidney. Bladder is unremarkable. Stomach/Bowel: Stomach is within normal limits. Appendix appears normal. No evidence of bowel wall thickening, distention, or inflammatory changes. Lymphatic: No enlarged abdominal or pelvic lymph nodes. Reproductive: Status post hysterectomy. No adnexal masses. Other: No abdominal wall hernia or abnormality. No abdominopelvic ascites. Musculoskeletal: Multilevel degenerative changes seen throughout the lumbar spine. This is most prominent at the levels of L4-L5 and L5-S1. Review of  the MIP images confirms the above findings. IMPRESSION: 1. No evidence of pulmonary embolus, aortic dissection or other acute vascular pathology. 2. No acute or active cardiopulmonary disease. 3. Marked severity calcification and atherosclerosis involving the thoracic and abdominal aorta which is stable in appearance when compared to the prior study dated January 01, 2017. 4. 5 mm non obstructing renal stone within the mid to lower left kidney. 5. Multilevel degenerative changes of the thoracic and lumbar spine. Electronically Signed   By: Virgina Norfolk M.D.   On: 08/01/2019 19:40     I have reviewed the above imaging : CT head unremarkable for acute findings   ASSESSMENT AND PLAN   79 year old female with advanced dementia who lives in a nursing home.  Neurology was consulted for altered mental status, patient on assessment appears to be alert, following commands and oriented to herself.  Suspect worsening mental status was in the setting of accelerated hypertension, urinary tract infection as well as delirium.  Can obtain MRI brain to rule out stroke in the setting of severe hypertension, low suspicion she had a stroke.  Also recommend checking B12.  TSH was normal.   Impression Hypertensive encephalopathy Acute metabolic encephalopathy in the setting of urinary tract infection Delirium with history of dementia  Recommendations -BP less than [id number] systolic -Turn off the television, controlled environment with minimal activating noises -As needed Seroquel 12.5 mg QHS PRN -Avoid benzos -Check B12 -MRI brain without contrast   She will need outpatient neurology follow-up for dementia.  May need to be started on Namenda/Aricept as this can help with symptoms of dementia.  Yasemin Rabon [employer]/support/administrative officer  Number [phone]

## 2019-08-03 NOTE — Progress Notes (Signed)
Physical Therapy Treatment Patient Details Name: Chloe Fletcher MRN: [id number] DOB: [birth year] Today's Date: 08/03/2019    History of Present Illness 79 yo female admitted with hypertensive urgency. Hx of dementia, DM, CKD, COPD    PT Comments    Assisted OOB to amb to bathroom. General Comments: Pt requires repeat functional VC's and is pleasantly confused.  75% redirection.  Able to perform routine self care such as toileting and washing but required VC's for direction.   General Gait Details: unable to correctly use walker due to cognition.  Pt appears to be a furniture walker. Also present with aphagia with difficulty finding correct words.     Follow Up Recommendations  Home health PT;Supervision/Assistance - 24 hour  At Assisted Living Facilty     Equipment Recommendations  None recommended by PT    Recommendations for Other Services       Precautions / Restrictions Precautions Precautions: Fall Restrictions Weight Bearing Restrictions: No    Mobility  Bed Mobility Overal bed mobility: Needs Assistance Bed Mobility: Supine to Sit     Supine to sit: Supervision     General bed mobility comments: self able  Transfers Overall transfer level: Needs assistance Equipment used: Rolling walker (2 wheeled);None Transfers: Sit to/from [employer] to Stand: Min guard Stand pivot transfers: Min assist       General transfer comment: Assist to rise, steady, control descent for safety.  Cues required for direction and to stay on task.  Ambulation/Gait Ambulation/Gait assistance: Supervision;Min guard Gait Distance (Feet): 55 Feet Assistive device: 1 person hand held assist Gait Pattern/deviations: Step-to pattern Gait velocity: decreased   General Gait Details: unable to correctly use walker due to cognition.  Pt appears to be a furniture walker.   Stairs             Wheelchair Mobility    Modified Rankin (Stroke Patients Only)       Balance                                            Cognition Arousal/Alertness: Awake/alert Behavior During Therapy: (pleasantly confused)                                   General Comments: Pt requires repeat functional VC's and is pleasantly confused.  75% redirection.  Able to perform routine self care such as toileting and washing but required VC's for direction      Exercises      General Comments        Pertinent Vitals/Pain Pain Assessment: No/denies pain    Home Living                      Prior Function            PT Goals (current goals can now be found in the care plan section) Progress towards PT goals: Progressing toward goals    Frequency    Min 3X/week      PT Plan Current plan remains appropriate    Co-evaluation              AM-PAC PT "6 Clicks" Mobility   Outcome Measure  Help needed turning from your back to your side while in a flat bed without using bedrails?: A Little  Help needed moving from lying on your back to sitting on the side of a flat bed without using bedrails?: A Little Help needed moving to and from a bed to a chair (including a wheelchair)?: A Little Help needed standing up from a chair using your arms (e.g., wheelchair or bedside chair)?: A Little Help needed to walk in hospital room?: A Little Help needed climbing 3-5 steps with a railing? : A Little 6 Click Score: 18    End of Session Equipment Utilized During Treatment: Gait belt Activity Tolerance: Patient tolerated treatment well Patient left: in bed;with call bell/phone within reach;with bed alarm set Nurse Communication: Mobility status PT Visit Diagnosis: Unsteadiness on feet (R26.81)     Time: [id number] PT Time Calculation (min) (ACUTE ONLY): 25 min  Charges:  $Gait Training: 8-22 mins $Therapeutic Activity: 8-22 mins                     Rica Koyanagi  PTA Acute  Rehabilitation Services Pager       [phone] Office      [phone]

## 2019-08-03 NOTE — Progress Notes (Addendum)
PROGRESS NOTE    Chloe Fletcher  [id number] DOB: 06/19/1941 DOA: 08/01/2019 PCP: Dulce Sellar, MD    Brief Narrative:  Chloe Fletcher is a 79 y.o. female with medical history significant for dementia, hypertension, type 2 diabetes mellitus, stage III chronic kidney disease, chronic tobacco abuse, COPD, who is admitted to [facility] on 08/02/2019 with hypertensive crisis after presenting from SNF to [facility] Emergency Department for evaluation of elevated blood pressure.  Blood pressure has finally settled down with resumption of her blood pressure medications.  She continues to remain confused, found to have a UTI,  started on antibiotics.  CT head unremarkable,  neurology consulted for persistent confusion, pending evaluation.   Assessment & Plan:   Principal Problem:   Hypertensive crisis Active Problems:   Acute cystitis   Diabetes mellitus without complication (HCC)   CKD (chronic kidney disease) stage 3, GFR 30-59 ml/min   COPD (chronic obstructive pulmonary disease) (HCC)   Hypertensive urgency  Hypertensive urgency: ->>>>> improving Blood pressure has settled down with resumption of her home blood pressure medications. CTA of the chest, abdomen, and pelvis showed no evidence of aortic dissection or acute vascular pathology, while presenting noncontrast CT head showed no evidence of acute intracranial process.  Losartan increased to 50 mg daily  UTI: Continue ceftriaxone, follow-up urine cultures.  Nonobstructing left renal stone:  She denies any pain.  Outpatient follow-up with urology  DM II:  Hold oral home medications, regular insulin sliding scale. Accu-Cheks before every meal and at bedtime  COPD: Not in exacerbation. Continue Breo elliptica and albuterol inhaler as needed.  CKD stage III: Her baseline creatinine remains around 1.5-1.7.  Confusion: She does have advanced dementia. As per son her cognition has significantly declined in the  last 1 month. Neurology consulted,  pending evaluation.  DVT prophylaxis:  Code Status: Full code Family Communication: Discussed with son over the phone in detail. Disposition Plan: Anticipated discharge to assisted living facility pending medical clearance. Barriers: Continued medical work-up for confusion  Consultants:   Neurology  Procedures:     Antimicrobials:  Anti-infectives (From admission, onward)   Start     Dose/Rate Route Frequency Ordered Stop   08/02/19 0215  cefTRIAXone (ROCEPHIN) 2 g in sodium chloride 0.9 % 100 mL IVPB     2 g 200 mL/hr over 30 Minutes Intravenous Daily 08/02/19 0140       Subjective: Patient was seen and examined at bedside, she is still appears significantly confused.  Denies any headache, fever, dizziness.  Objective: Vitals:   08/03/19 0219 08/03/19 0543 08/03/19 0805 08/03/19 1323  BP: (!) 181/57 (!) 188/56 (!) 196/50 (!) 164/55  Pulse: 78 67 69 76  Resp: 17 19  20   Temp: 98.5 F (36.9 C) 98.5 F (36.9 C)  97.9 F (36.6 C)  TempSrc: Oral   Oral  SpO2: 96% 97%  95%  Weight:      Height:        Intake/Output Summary (Last 24 hours) at 08/03/2019 1536 Last data filed at 08/03/2019 1000 Gross per 24 hour  Intake 100.02 ml  Output --  Net 100.02 ml   Filed Weights   08/01/19 1426 08/02/19 0500  Weight: 54.4 kg 55.8 kg    Examination:  General exam: Appears calm and comfortable  Respiratory system: Clear to auscultation. Respiratory effort normal. Cardiovascular system: S1 & S2 heard, RRR. No JVD, murmurs, rubs, gallops or clicks. No pedal edema. Gastrointestinal system: Abdomen is nondistended, soft  and nontender. No organomegaly or masses felt. Normal bowel sounds heard. Central nervous system: Alert and oriented. No focal neurological deficits. Extremities: Symmetric 5 x 5 power. Skin: No rashes, lesions or ulcers  Data Reviewed: I have personally reviewed following labs and imaging studies  CBC: Recent Labs  Lab  08/01/19 1508 08/02/19 0516 08/03/19 0507  WBC 7.7 8.0 6.3  HGB 11.2* 10.9* 10.2*  HCT 33.9* 32.5* 31.0*  MCV 97.4 95.6 97.2  PLT 354 307 [id number]   Basic Metabolic Panel: Recent Labs  Lab 08/01/19 1508 08/02/19 0516 08/03/19 0507  NA 135 133* 135  K 3.5 3.3* 3.9  CL 100 100 104  CO2 27 23 22   GLUCOSE 134* 134* 114*  BUN 22 20 26*  CREATININE 1.38* 1.47* 1.60*  CALCIUM 9.2 9.1 9.1  MG  --  1.8 2.1  PHOS  --   --  4.6   GFR: Estimated Creatinine Clearance: 22.9 mL/min (A) (by C-G formula based on SCr of 1.6 mg/dL (H)). Liver Function Tests: Recent Labs  Lab 08/01/19 1508 08/02/19 0516 08/03/19 0507  AST 17 16 14*  ALT 13 13 13   ALKPHOS 49 47 42  BILITOT 0.9 0.5 0.5  PROT 6.5 5.9* 5.6*  ALBUMIN 3.4* 3.1* 2.7*   Recent Labs  Lab 08/01/19 1508  LIPASE 36   No results for input(s): AMMONIA in the last 168 hours. Coagulation Profile: No results for input(s): INR, PROTIME in the last 168 hours. Cardiac Enzymes: No results for input(s): CKTOTAL, CKMB, CKMBINDEX, TROPONINI in the last 168 hours. BNP (last 3 results) No results for input(s): PROBNP in the last 8760 hours. HbA1C: Recent Labs    08/02/19 0516  HGBA1C 5.7*   CBG: Recent Labs  Lab 08/02/19 1453 08/02/19 1632 08/02/19 2052 08/03/19 0751 08/03/19 1155  GLUCAP 116* 110* 247* 139* 113*   Lipid Profile: No results for input(s): CHOL, HDL, LDLCALC, TRIG, CHOLHDL, LDLDIRECT in the last 72 hours. Thyroid Function Tests: Recent Labs    08/02/19 0516  TSH 0.960   Anemia Panel: No results for input(s): VITAMINB12, FOLATE, FERRITIN, TIBC, IRON, RETICCTPCT in the last 72 hours. Sepsis Labs: No results for input(s): PROCALCITON, LATICACIDVEN in the last 168 hours.  Recent Results (from the past 240 hour(s))  Culture, Urine     Status: Abnormal   Collection Time: 08/01/19  8:07 PM   Specimen: Urine, Clean Catch  Result Value Ref Range Status   Specimen Description   Final    URINE, CLEAN  CATCH Performed at [facility] [place] [address] [place], [place] [address]    Special Requests   Final    NONE Performed at [facility], [place] [address] [place], [place] [address]    Culture (A)  Final    <10,000 COLONIES/mL INSIGNIFICANT GROWTH Performed at [place] [address] [place], [place] [address]    Report Status 08/03/2019 FINAL  Final  Respiratory Panel by RT PCR (Flu A&B, Covid) - Nasopharyngeal Swab     Status: None   Collection Time: 08/02/19 12:26 AM   Specimen: Nasopharyngeal Swab  Result Value Ref Range Status   SARS Coronavirus 2 by RT PCR NEGATIVE NEGATIVE Final    Comment: (NOTE) SARS-CoV-2 target nucleic acids are NOT DETECTED. The SARS-CoV-2 RNA is generally detectable in upper respiratoy specimens during the acute phase of infection. The lowest concentration of SARS-CoV-2 viral copies this assay can detect is 131 copies/mL. A negative result does not preclude  SARS-Cov-2 infection and should not be used as the sole basis for treatment or other patient management decisions. A negative result may occur with  improper specimen collection/handling, submission of specimen other than nasopharyngeal swab, presence of viral mutation(s) within the areas targeted by this assay, and inadequate number of viral copies (<131 copies/mL). A negative result must be combined with clinical observations, patient history, and epidemiological information. The expected result is Negative. Fact Sheet for Patients:  PinkCheek.be Fact Sheet for Healthcare Providers:  GravelBags.it This test is not yet ap proved or cleared by the [place] FDA and  has been authorized for detection and/or diagnosis of SARS-CoV-2 by FDA under an Emergency Use Authorization (EUA). This EUA will remain  in effect (meaning this test can be used) for the duration of the COVID-19 declaration  under Section 564(b)(1) of the Act, 21 U.S.C. section 360bbb-3(b)(1), unless the authorization is terminated or revoked sooner.    Influenza A by PCR NEGATIVE NEGATIVE Final   Influenza B by PCR NEGATIVE NEGATIVE Final    Comment: (NOTE) The Xpert Xpress SARS-CoV-2/FLU/RSV assay is intended as an aid in  the diagnosis of influenza from Nasopharyngeal swab specimens and  should not be used as a sole basis for treatment. Nasal washings and  aspirates are unacceptable for Xpert Xpress SARS-CoV-2/FLU/RSV  testing. Fact Sheet for Patients: PinkCheek.be Fact Sheet for Healthcare Providers: GravelBags.it This test is not yet approved or cleared by the [place] FDA and  has been authorized for detection and/or diagnosis of SARS-CoV-2 by  FDA under an Emergency Use Authorization (EUA). This EUA will remain  in effect (meaning this test can be used) for the duration of the  Covid-19 declaration under Section 564(b)(1) of the Act, 21  U.S.C. section 360bbb-3(b)(1), unless the authorization is  terminated or revoked. Performed at [facility], [place] [address], [place], [place] [address]   Culture, blood (Routine X 2) w Reflex to ID Panel     Status: None (Preliminary result)   Collection Time: 08/02/19  1:40 AM   Specimen: BLOOD  Result Value Ref Range Status   Specimen Description   Final    BLOOD LEFT ANTECUBITAL Performed at [place] [address], [place], [place] [address]    Special Requests   Final    BOTTLES DRAWN AEROBIC AND ANAEROBIC Blood Culture adequate volume Performed at [place] [address] [place], [place] [address]    Culture   Final    NO GROWTH 1 DAY Performed at [facility], [place] [address], [place], [place] [address]    Report Status PENDING  Incomplete  Culture, blood (Routine X 2) w Reflex to ID Panel     Status: None  (Preliminary result)   Collection Time: 08/02/19  1:45 AM   Specimen: BLOOD  Result Value Ref Range Status   Specimen Description   Final    BLOOD LEFT ANTECUBITAL Performed at [place] [address], [place], [place] [address]    Special Requests   Final    BOTTLES DRAWN AEROBIC AND ANAEROBIC Blood Culture adequate volume Performed at [place] [address], [place], [place] [address]    Culture   Final    NO GROWTH 1 DAY Performed at [facility], [place] [address], [place], [place] [address]    Report Status PENDING  Incomplete     Radiology Studies: CT Head Wo Contrast  Result  Date: 08/01/2019 CLINICAL DATA:  Elevated blood pressure left-sided pain. EXAM: CT HEAD WITHOUT CONTRAST TECHNIQUE: Contiguous axial images were obtained from the base of the skull through the vertex without intravenous contrast. COMPARISON:  January 01, 2017 FINDINGS: Brain: There is moderate severity cerebral atrophy with widening of the extra-axial spaces and ventricular dilatation. There are areas of decreased attenuation within the white matter tracts of the supratentorial brain, consistent with microvascular disease changes. 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 Vascular: No hyperdense vessel or unexpected calcification. Skull: Normal. Negative for fracture or focal lesion. Sinuses/Orbits: No acute finding. Other: None. IMPRESSION: No acute intracranial pathology. Electronically Signed   By: Virgina Norfolk M.D.   On: 08/01/2019 19:42   CT Renal Stone Study  Result Date: 08/01/2019 CLINICAL DATA:  Flank pain. EXAM: CT ABDOMEN AND PELVIS WITHOUT CONTRAST TECHNIQUE: Multidetector CT imaging of the abdomen and pelvis was performed following the standard protocol without IV contrast. COMPARISON:  January 01, 2017 FINDINGS: Lower chest: No acute abnormality. Hepatobiliary: No focal liver abnormality is seen. No gallstones, gallbladder wall  thickening, or biliary dilatation. Pancreas: Unremarkable. No pancreatic ductal dilatation or surrounding inflammatory changes. Spleen: Normal in size without focal abnormality. Adrenals/Urinary Tract: Adrenal glands are unremarkable. Kidneys are normal in size. A 1.0 cm cyst is seen along the posterior aspect of the mid right kidney. Adjacent 3 mm nonobstructing renal stones are seen within the lower pole of the left kidney. Bladder is unremarkable. Stomach/Bowel: Stomach is within normal limits. Appendix appears normal. No evidence of bowel wall thickening, distention, or inflammatory changes. Vascular/Lymphatic: Marked severity aortic atherosclerosis. No enlarged abdominal or pelvic lymph nodes. Reproductive: Status post hysterectomy. No adnexal masses. Other: No abdominal wall hernia or abnormality. No abdominopelvic ascites. Musculoskeletal: Multilevel degenerative changes seen throughout the lumbar spine. IMPRESSION: 1. Adjacent 3 mm nonobstructing renal stones within the left kidney. 2. Small right renal cyst. Aortic Atherosclerosis (ICD10-I70.0). Electronically Signed   By: Virgina Norfolk M.D.   On: 08/01/2019 17:07   CT Angio Chest/Abd/Pel for Dissection W and/or Wo Contrast  Result Date: 08/01/2019 CLINICAL DATA:  Elevated blood pressure left-sided pain. EXAM: CT ANGIOGRAPHY CHEST, ABDOMEN AND PELVIS TECHNIQUE: Multidetector CT imaging through the chest, abdomen and pelvis was performed using the standard protocol during bolus administration of intravenous contrast. Multiplanar reconstructed images and MIPs were obtained and reviewed to evaluate the vascular anatomy. CONTRAST:  67mL OMNIPAQUE IOHEXOL 350 MG/ML SOLN COMPARISON:  Chest, abdomen and pelvis CT, dated January 01, 2017, is available for comparison. FINDINGS: CTA CHEST FINDINGS Cardiovascular: There is stable marked severity calcification and atherosclerosis seen throughout the thoracic aorta. Satisfactory opacification of the pulmonary  arteries to the segmental level. No evidence of pulmonary embolism. Normal heart size. No pericardial effusion. Mediastinum/Nodes: No enlarged mediastinal, hilar, or axillary lymph nodes. A 6 mm cyst is seen within the left lobe of the thyroid gland. Lungs/Pleura: There is mild biapical scarring and/or atelectasis. There is no evidence of a pleural effusion or pneumothorax. Musculoskeletal: Multilevel degenerative changes seen throughout the thoracic spine. Review of the MIP images confirms the above findings. CTA ABDOMEN AND PELVIS FINDINGS VASCULAR Aorta: Marked severity calcification and atherosclerosis without evidence of aneurysmal dilatation or dissection. Celiac: Mild calcification without evidence of aneurysmal dilatation or dissection. SMA: Patent without evidence of aneurysm, dissection, vasculitis or significant stenosis. Renals: Mild calcification without evidence of aneurysmal dilatation or dissection. IMA: Patent without evidence of aneurysm, dissection, vasculitis or significant stenosis. Inflow: Marked severity calcification without evidence of  aneurysmal dilatation or dissection Veins: No obvious venous abnormality within the limitations of this arterial phase study. Review of the MIP images confirms the above findings. NON-VASCULAR Hepatobiliary: No focal liver abnormality is seen. No gallstones, gallbladder wall thickening, or biliary dilatation. Pancreas: Unremarkable. No pancreatic ductal dilatation or surrounding inflammatory changes. Spleen: Normal in size without focal abnormality. Adrenals/Urinary Tract: Adrenal glands are unremarkable. Kidneys are normal in size without hydronephrosis. A stable 5 mm non obstructing renal stone is seen within the mid to lower left kidney. A stable 1.0 cm cyst is seen along the posteromedial aspect of the mid right kidney. Bladder is unremarkable. Stomach/Bowel: Stomach is within normal limits. Appendix appears normal. No evidence of bowel wall thickening,  distention, or inflammatory changes. Lymphatic: No enlarged abdominal or pelvic lymph nodes. Reproductive: Status post hysterectomy. No adnexal masses. Other: No abdominal wall hernia or abnormality. No abdominopelvic ascites. Musculoskeletal: Multilevel degenerative changes seen throughout the lumbar spine. This is most prominent at the levels of L4-L5 and L5-S1. Review of the MIP images confirms the above findings. IMPRESSION: 1. No evidence of pulmonary embolus, aortic dissection or other acute vascular pathology. 2. No acute or active cardiopulmonary disease. 3. Marked severity calcification and atherosclerosis involving the thoracic and abdominal aorta which is stable in appearance when compared to the prior study dated January 01, 2017. 4. 5 mm non obstructing renal stone within the mid to lower left kidney. 5. Multilevel degenerative changes of the thoracic and lumbar spine. Electronically Signed   By: Virgina Norfolk M.D.   On: 08/01/2019 19:40     Scheduled Meds: . amLODipine  5 mg Oral Daily  . clopidogrel  75 mg Oral Daily  . feeding supplement (ENSURE ENLIVE)  237 mL Oral BID BM  . fenofibrate  54 mg Oral Daily  . fluticasone furoate-vilanterol  1 puff Inhalation Daily  . insulin aspart  0-9 Units Subcutaneous TID WC  . losartan  12.5 mg Oral Daily  . metoprolol tartrate  50 mg Oral BID  . sodium chloride flush  3 mL Intravenous Q12H   Continuous Infusions: . cefTRIAXone (ROCEPHIN)  IV Stopped (08/03/19 1342)     LOS: 1 day    Time spent: 25 mins.    Shawna Clamp, MD [facility]   If 7PM-7AM, please contact night-coverage

## 2019-08-03 NOTE — Plan of Care (Signed)
  Problem: Education: Goal: Knowledge of General Education information will improve Description: Including pain rating scale, medication(s)/side effects and non-pharmacologic comfort measures Outcome: Progressing   Problem: Health Behavior/Discharge Planning: Goal: Ability to manage health-related needs will improve Outcome: Progressing   Problem: Clinical Measurements: Goal: Ability to maintain clinical measurements within normal limits will improve Outcome: Progressing Goal: Will remain free from infection Outcome: Progressing Goal: Diagnostic test results will improve Outcome: Progressing Goal: Respiratory complications will improve Outcome: Completed/Met Goal: Cardiovascular complication will be avoided Outcome: Completed/Met   Problem: Activity: Goal: Risk for activity intolerance will decrease Outcome: Progressing   Problem: Nutrition: Goal: Adequate nutrition will be maintained Outcome: Progressing   Problem: Coping: Goal: Level of anxiety will decrease Outcome: Progressing   Problem: Elimination: Goal: Will not experience complications related to bowel motility Outcome: Progressing Goal: Will not experience complications related to urinary retention Outcome: Completed/Met   Problem: Pain Managment: Goal: General experience of comfort will improve Outcome: Completed/Met   Problem: Safety: Goal: Ability to remain free from injury will improve Outcome: Progressing   Problem: Skin Integrity: Goal: Risk for impaired skin integrity will decrease Outcome: Progressing   Problem: Fluid Volume: Goal: Hemodynamic stability will improve Outcome: Progressing   Problem: Clinical Measurements: Goal: Diagnostic test results will improve Outcome: Progressing Goal: Signs and symptoms of infection will decrease Outcome: Progressing   Problem: Respiratory: Goal: Ability to maintain adequate ventilation will improve Outcome: Completed/Met   Problem: Urinary  Elimination: Goal: Signs and symptoms of infection will decrease Outcome: Progressing

## 2019-08-04 ENCOUNTER — Inpatient Hospital Stay (HOSPITAL_COMMUNITY): Payer: Medicare Other

## 2019-08-04 DIAGNOSIS — R55 Syncope and collapse: Secondary | ICD-10-CM

## 2019-08-04 DIAGNOSIS — I639 Cerebral infarction, unspecified: Secondary | ICD-10-CM

## 2019-08-04 DIAGNOSIS — I160 Hypertensive urgency: Secondary | ICD-10-CM

## 2019-08-04 LAB — CBC
HCT: 30.9 % — ABNORMAL LOW (ref 36.0–46.0)
Hemoglobin: 10.2 g/dL — ABNORMAL LOW (ref 12.0–15.0)
MCH: 32.3 pg (ref 26.0–34.0)
MCHC: 33 g/dL (ref 30.0–36.0)
MCV: 97.8 fL (ref 80.0–100.0)
Platelets: 329 10*3/uL (ref 150–400)
RBC: 3.16 MIL/uL — ABNORMAL LOW (ref 3.87–5.11)
RDW: 13.1 % (ref 11.5–15.5)
WBC: 5.7 10*3/uL (ref 4.0–10.5)
nRBC: 0 % (ref 0.0–0.2)

## 2019-08-04 LAB — MAGNESIUM: Magnesium: 2 mg/dL (ref 1.7–2.4)

## 2019-08-04 LAB — BASIC METABOLIC PANEL
Anion gap: 9 (ref 5–15)
BUN: 29 mg/dL — ABNORMAL HIGH (ref 8–23)
CO2: 23 mmol/L (ref 22–32)
Calcium: 8.9 mg/dL (ref 8.9–10.3)
Chloride: 105 mmol/L (ref 98–111)
Creatinine, Ser: 1.68 mg/dL — ABNORMAL HIGH (ref 0.44–1.00)
GFR calc Af Amer: 33 mL/min — ABNORMAL LOW (ref >60)
GFR calc non Af Amer: 29 mL/min — ABNORMAL LOW (ref >60)
Glucose, Bld: 109 mg/dL — ABNORMAL HIGH (ref 70–99)
Potassium: 3.9 mmol/L (ref 3.5–5.1)
Sodium: 137 mmol/L (ref 135–145)

## 2019-08-04 LAB — GLUCOSE, CAPILLARY
Glucose-Capillary: 111 mg/dL — ABNORMAL HIGH (ref 70–99)
Glucose-Capillary: 119 mg/dL — ABNORMAL HIGH (ref 70–99)
Glucose-Capillary: 105 mg/dL — ABNORMAL HIGH (ref 70–99)
Glucose-Capillary: 117 mg/dL — ABNORMAL HIGH (ref 70–99)

## 2019-08-04 LAB — PHOSPHORUS: Phosphorus: 4.4 mg/dL (ref 2.5–4.6)

## 2019-08-04 MED ORDER — ACETAMINOPHEN 325 MG PO TABS
650.0000 mg | ORAL_TABLET | ORAL | Status: DC | PRN
  Administered 2019-08-04 – 2019-08-09 (×2): 650 mg via ORAL
  Filled 2019-08-04 (×2): qty 2

## 2019-08-04 MED ORDER — ACETAMINOPHEN 160 MG/5ML PO SOLN: 650.0000 mg | ORAL | Status: DC | PRN

## 2019-08-04 MED ORDER — LORAZEPAM 2 MG/ML IJ SOLN
0.5000 mg | Freq: Once | INTRAMUSCULAR | Status: AC
  Administered 2019-08-04: 0.5 mg via INTRAVENOUS
  Filled 2019-08-04: qty 1

## 2019-08-04 MED ORDER — STROKE: EARLY STAGES OF RECOVERY BOOK: Freq: Once | Status: AC

## 2019-08-04 MED ORDER — ASPIRIN 300 MG RE SUPP
300.0000 mg | Freq: Every day | RECTAL | Status: DC
  Filled 2019-08-04 (×7): qty 1

## 2019-08-04 MED ORDER — ENOXAPARIN SODIUM 40 MG/0.4ML ~~LOC~~ SOLN: 40.0000 mg | SUBCUTANEOUS | Status: DC

## 2019-08-04 MED ORDER — ENOXAPARIN SODIUM 30 MG/0.3ML ~~LOC~~ SOLN
30.0000 mg | SUBCUTANEOUS | Status: DC
  Administered 2019-08-04 – 2019-08-08 (×5): 30 mg via SUBCUTANEOUS
  Filled 2019-08-04 (×5): qty 0.3

## 2019-08-04 MED ORDER — ASPIRIN 325 MG PO TABS
325.0000 mg | ORAL_TABLET | Freq: Every day | ORAL | Status: DC
  Administered 2019-08-04 – 2019-08-09 (×6): 325 mg via ORAL
  Filled 2019-08-04 (×6): qty 1

## 2019-08-04 MED ORDER — ACETAMINOPHEN 650 MG RE SUPP: 650.0000 mg | RECTAL | Status: DC | PRN

## 2019-08-04 NOTE — Evaluation (Signed)
Speech Language Pathology Evaluation Patient Details Name: Kaija Biondi MRN: [id number] DOB: 1941/01/29 Today's Date: 08/04/2019 Time: [id number] SLP Time Calculation (min) (ACUTE ONLY): 38 min  Problem List:  Patient Active Problem List   Diagnosis Date Noted  . Hypertensive crisis 08/02/2019  . Acute cystitis 08/02/2019  . Hypertensive urgency 08/02/2019  . Diabetes mellitus without complication ([place])   . CKD (chronic kidney disease) stage 3, GFR 30-59 ml/min   . COPD (chronic obstructive pulmonary disease) (HCC)    Past Medical History:  Past Medical History:  Diagnosis Date  . Back pain   . Carotid artery stenosis   . Cognitive decline   . Diabetes mellitus without complication ([place])   . Hypertension   . Renal disorder    Past Surgical History: History reviewed. No pertinent surgical history. HPI:  79 yo female adm to [facility] with AMS, HTN crisis.  Pt PMH + for COPD, dementia, HTN, CKD, tobacco use,   Pt with increased confusion compared to normal per son's statement to RN yesterday.  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  Speech/swallow eval ordered. Pt stays at [facility].   Assessment / Plan / Recommendation Clinical Impression  Pt presents with severe cognitive linguistic deficits that are likely exacerbated by current stroke and her hearing loss.  SLP placed hearing aids after able to turn them on.  Per RN *who worked with pt yesterday*, her cognition is worse than baseline.  Today pt with significantly impaired attention, problem solving and language skills.  Most verbal output is consistent with language of confusion however using context and gestural/non verbal cues *pt holding her head*, SLP able to understand she is having pain in her head.  Pt is able to read sentences out loud, but inconsistently able to follow directions.  She will require 24/7 supervision due to her level of cognitive deficits.  No  family present at this time.  Will follow up for famly education/mitigation.    SLP Assessment  SLP Recommendation/Assessment: Patient needs continued Speech Lanaguage Pathology Services SLP Visit Diagnosis: Attention and concentration deficit;Cognitive communication deficit (R41.841)    Follow Up Recommendations  Home health SLP    Frequency and Duration min 1 x/week  1 week      SLP Evaluation Cognition  Overall Cognitive Status: Impaired/Different from baseline Orientation Level: Oriented to person;Disoriented to time;Disoriented to place;Disoriented to situation Attention: Sustained Sustained Attention: Impaired Sustained Attention Impairment: Verbal basic;Functional basic Memory: Impaired Memory Impairment: Storage deficit;Retrieval deficit Awareness: Impaired Awareness Impairment: Intellectual impairment(pt not aware of her cognitive deficits, disorientation) Problem Solving: Impaired Problem Solving Impairment: Verbal basic;Functional basic Executive Function: (pt is not at this level of cognition) Safety/Judgment: Impaired       Comprehension  Auditory Comprehension Overall Auditory Comprehension: Impaired Yes/No Questions: Not tested Commands: Impaired One Step Basic Commands: 0-24% accurate Conversation: Simple Other Conversation Comments: pt with decreased understanding to basic language, she had her hearing aids in place for 1/2 of her testing - as SLP was finally able to get them turned on, pt able to read and identify her own name but not current location *home or hospital Interfering Components: Attention;Working [employer];Anxiety;Motor planning;Processing speed;Hearing EffectiveTechniques: Visual/Gestural cues;Extra processing time;Increased volume;Repetition Visual Recognition/Discrimination Discrimination: Not tested Reading Comprehension Reading Status: Impaired(pt able to read approx 90% of words aloud but did not consistently follow directions or  match objects to words (likely) due to cognitive deficits) Interfering Components: Attention;Processing time  Effective Techniques: Large print;Visual cueing;Tactile cueing;Verbal cueing    Expression Expression Primary Mode of Expression: Verbal Verbal Expression Overall Verbal Expression: Impaired Initiation: Impaired Automatic Speech: Counting(pt with difficulty counting 1-10 with total cues) Level of Generative/Spontaneous Verbalization: Sentence Repetition: Impaired Level of Impairment: Word level Naming: Impairment Responsive: 0-25% accurate Confrontation: Impaired Verbal Errors: Language of confusion;Not aware of errors;Aware of errors;Confabulation;Neologisms;Jargon(inconsistently aware of errors) Pragmatics: Impairment Impairments: Topic maintenance;Turn Taking;Eye contact Effective Techniques: Other (Comment);Written cues Non-Verbal Means of Communication: Not applicable Other Verbal Expression Comments: pt demonstrating language of confusion mostly, inconsistently she is able to articulate her message with some comprehensiblity with context cues - eg: head pain indicated gesturally and verbally, using context will be vital to determine pt's needs/wants; Written Expression Dominant Hand: Right Written Expression: Not tested   Oral / Motor  Oral Motor/Sensory Function Overall Oral Motor/Sensory Function: Mild impairment(pt did not follow directions, ? mild right facial asymmetry, bilateral palatal elevation) Facial ROM: Within Functional Limits Facial Symmetry: Abnormal symmetry right Facial Strength: Within Functional Limits Lingual ROM: Within Functional Limits Lingual Strength: Within Functional Limits Velum: Within Functional Limits Motor Speech Overall Motor Speech: Appears within functional limits for tasks assessed Respiration: Within functional limits Phonation: Normal Resonance: Within functional limits Articulation: Within functional limitis Intelligibility:  Intelligible Motor Speech Errors: Inconsistent;Groping for words Interfering Components: Premorbid status;Hearing loss   GO                    Macario Golds 08/04/2019, 2:20 PM   Kathleen Lime, MS [place] [facility] [phone]

## 2019-08-04 NOTE — TOC Progression Note (Signed)
Transition of Care [facility]) - Progression Note    Patient Details  Name: Snigdha Henshall MRN: [id number] Date of Birth: October 17, 1940  Transition of [facility]) CM/SW Contact  Ahlia Lemanski, Juliann Pulse, RN Phone Number: 08/04/2019, 12:03 PM  Clinical Narrative: Noted medical finding-stroke w/u.From [facility] level-HHPT. Continue to follow.     Expected Discharge Plan: Assisted Living Barriers to Discharge: Continued Medical Work up  Expected Discharge Plan and Services Expected Discharge Plan: Assisted Living   Discharge Planning Services: CM Consult   Living arrangements for the past 2 months: Assisted Living Facility                                       Social Determinants of Health (SDOH) Interventions    Readmission Risk Interventions No flowsheet data found.

## 2019-08-04 NOTE — Progress Notes (Signed)
Carotid artery duplex completed. Refer to "CV Proc" under chart review to view preliminary results.  2D Echocardiogram with bubbles completed.  08/04/2019 2:35 PM Kelby Aline., MHA, RVT, RDCS, RDMS

## 2019-08-04 NOTE — Progress Notes (Signed)
Punctate right frontal lobe cortically-based infarction seen on MRI.   Would obtain remainder of stroke work up: TTE, MRA head, carotid ultrasound and cardiac telemetry. If no structural abnormality predisposing to stroke is seen, would continue Plavix without addition of ASA as the acute punctate infarction is most likely secondary to vasospasm in the setting of hypertensive emergency.   Electronically signed: Dr. Kerney Elbe

## 2019-08-04 NOTE — Progress Notes (Signed)
PROGRESS NOTE    Chloe Fletcher DOB: 12-17-1940 DOA: 08/01/2019 PCP: Dulce Sellar, MD    Brief Narrative:  Chloe Fletcher is a 79 y.o. female with medical history significant for dementia, hypertension, type 2 diabetes mellitus, stage III chronic kidney disease, chronic tobacco abuse, COPD, who is admitted to [facility] on 08/02/2019 with hypertensive crisis after presenting from SNF to [facility] Emergency Department for evaluation of elevated blood pressure.  Blood pressure has finally settled down with resumption of her blood pressure medications.  She continues to remain confused, found to have a UTI,  started on antibiotics.  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  Neurology is following  Assessment & Plan:   Principal Problem:   Hypertensive crisis Active Problems:   Acute cystitis   Diabetes mellitus without complication (HCC)   CKD (chronic kidney disease) stage 3, GFR 30-59 ml/min   COPD (chronic obstructive pulmonary disease) (HCC)   Hypertensive urgency  Hypertensive urgency: ->>>>> improving Blood pressure has settled down with resumption of her home blood pressure medications. CTA of the chest, abdomen, and pelvis showed no evidence of aortic dissection or acute vascular pathology, while presenting noncontrast CT head showed no evidence of acute intracranial process.  Losartan increased to 50 mg daily.  Acute stroke: She presents with worsening confusion, poor cognition, no focal deficits. MRI head consistent with 3 mm acute infarct right mid frontal cortex. Neurology suggest full stroke work-up. Follow-up TTE, carotid duplex and, MRI brain Neurology will follow up.  UTI: Continue ceftriaxone, follow-up urine cultures.  Nonobstructing left renal stone:  She denies any pain.   Outpatient follow-up with urology  DM II:  Hold oral home medications, regular insulin sliding scale. Accu-Cheks before every meal and at bedtime  COPD: Not in exacerbation. Continue Breo elliptica and albuterol inhaler as needed.  CKD stage III: Her baseline creatinine remains around 1.5-1.7.  Confusion: She does have advanced dementia. As per son her cognition has significantly declined in the last 1 month. Neurology consulted,    DVT prophylaxis:  Code Status: Full code Family Communication: Discussed with son over the phone in detail. Disposition Plan: Anticipated discharge to assisted living facility pending medical clearance. Barriers: Continued medical work-up for confusion  Consultants:   Neurology  Procedures:     Antimicrobials:  Anti-infectives (From admission, onward)   Start     Dose/Rate Route Frequency Ordered Stop   08/02/19 0215  cefTRIAXone (ROCEPHIN) 2 g in sodium chloride 0.9 % 100 mL IVPB     2 g 200 mL/hr over 30 Minutes Intravenous Daily 08/02/19 0140       Subjective: Patient was seen and examined at bedside, she is still appears significantly confused.  Denies any headache, fever, dizziness.  Objective: Vitals:   08/03/19 1709 08/03/19 2119 08/04/19 0518 08/04/19 0804  BP: (!) 177/51 (!) 136/55 (!) 127/59   Pulse: 75 93 96   Resp: 16 20 20    Temp: 97.6 F (36.4 C) 98 F (36.7 C) 98.9 F (37.2 C)   TempSrc: Oral Oral Oral   SpO2: 99% 96% 100% 96%  Weight:      Height:        Intake/Output Summary (Last 24 hours) at 08/04/2019 1340 Last data filed at 08/04/2019 0600 Gross per 24 hour  Intake 720 ml  Output --  Net  720 ml   Filed Weights   08/01/19 1426 08/02/19 0500  Weight: 54.4 kg 55.8 kg    Examination:  General exam: Appears calm and comfortable  Respiratory system: Clear to auscultation. Respiratory effort normal. Cardiovascular system: S1 & S2 heard, RRR. No JVD, murmurs, rubs, gallops or clicks. No pedal  edema. Gastrointestinal system: Abdomen is nondistended, soft and nontender. No organomegaly or masses felt. Normal bowel sounds heard. Central nervous system: Alert and oriented. No focal neurological deficits. Extremities: Symmetric 5 x 5 power. Skin: No rashes, lesions or ulcers  Data Reviewed: I have personally reviewed following labs and imaging studies  CBC: Recent Labs  Lab 08/01/19 1508 08/02/19 0516 08/03/19 0507 08/04/19 0415  WBC 7.7 8.0 6.3 5.7  HGB 11.2* 10.9* 10.2* 10.2*  HCT 33.9* 32.5* 31.0* 30.9*  MCV 97.4 95.6 97.2 97.8  PLT 354 307 321 [id number]   Basic Metabolic Panel: Recent Labs  Lab 08/01/19 1508 08/02/19 0516 08/03/19 0507 08/04/19 0415  NA 135 133* 135 137  K 3.5 3.3* 3.9 3.9  CL 100 100 104 105  CO2 27 23 22 23   GLUCOSE 134* 134* 114* 109*  BUN 22 20 26* 29*  CREATININE 1.38* 1.47* 1.60* 1.68*  CALCIUM 9.2 9.1 9.1 8.9  MG  --  1.8 2.1 2.0  PHOS  --   --  4.6 4.4   GFR: Estimated Creatinine Clearance: 21.8 mL/min (A) (by C-G formula based on SCr of 1.68 mg/dL (H)). Liver Function Tests: Recent Labs  Lab 08/01/19 1508 08/02/19 0516 08/03/19 0507  AST 17 16 14*  ALT 13 13 13   ALKPHOS 49 47 42  BILITOT 0.9 0.5 0.5  PROT 6.5 5.9* 5.6*  ALBUMIN 3.4* 3.1* 2.7*   Recent Labs  Lab 08/01/19 1508  LIPASE 36   No results for input(s): AMMONIA in the last 168 hours. Coagulation Profile: No results for input(s): INR, PROTIME in the last 168 hours. Cardiac Enzymes: No results for input(s): CKTOTAL, CKMB, CKMBINDEX, TROPONINI in the last 168 hours. BNP (last 3 results) No results for input(s): PROBNP in the last 8760 hours. HbA1C: Recent Labs    08/02/19 0516  HGBA1C 5.7*   CBG: Recent Labs  Lab 08/03/19 1155 08/03/19 1706 08/03/19 2201 08/04/19 0742 08/04/19 1156  GLUCAP 113* 97 104* 111* 119*   Lipid Profile: No results for input(s): CHOL, HDL, LDLCALC, TRIG, CHOLHDL, LDLDIRECT in the last 72 hours. Thyroid Function  Tests: Recent Labs    08/02/19 0516  TSH 0.960   Anemia Panel: Recent Labs    08/03/19 1511  VITAMINB12 288   Sepsis Labs: No results for input(s): PROCALCITON, LATICACIDVEN in the last 168 hours.  Recent Results (from the past 240 hour(s))  Culture, Urine     Status: Abnormal   Collection Time: 08/01/19  8:07 PM   Specimen: Urine, Clean Catch  Result Value Ref Range Status   Specimen Description   Final    URINE, CLEAN CATCH Performed at [facility], [place] [address], [place], [place] [address]    Special Requests   Final    NONE Performed at [facility], [place] [address] [place], [place] [address]    Culture (A)  Final    <10,000 COLONIES/mL INSIGNIFICANT GROWTH Performed at [facility] [address], [place], [place] [address]    Report Status 08/03/2019 FINAL  Final  Respiratory Panel by RT PCR (Flu A&B, Covid) - Nasopharyngeal Swab     Status: None  Collection Time: 08/02/19 12:26 AM   Specimen: Nasopharyngeal Swab  Result Value Ref Range Status   SARS Coronavirus 2 by RT PCR NEGATIVE NEGATIVE Final    Comment: (NOTE) SARS-CoV-2 target nucleic acids are NOT DETECTED. The SARS-CoV-2 RNA is generally detectable in upper respiratoy specimens during the acute phase of infection. The lowest concentration of SARS-CoV-2 viral copies this assay can detect is 131 copies/mL. A negative result does not preclude SARS-Cov-2 infection and should not be used as the sole basis for treatment or other patient management decisions. A negative result may occur with  improper specimen collection/handling, submission of specimen other than nasopharyngeal swab, presence of viral mutation(s) within the areas targeted by this assay, and inadequate number of viral copies (<131 copies/mL). A negative result must be combined with clinical observations, patient history, and epidemiological information. The expected result is Negative. Fact  Sheet for Patients:  PinkCheek.be Fact Sheet for Healthcare Providers:  GravelBags.it This test is not yet ap proved or cleared by the [place] FDA and  has been authorized for detection and/or diagnosis of SARS-CoV-2 by FDA under an Emergency Use Authorization (EUA). This EUA will remain  in effect (meaning this test can be used) for the duration of the COVID-19 declaration under Section 564(b)(1) of the Act, 21 U.S.C. section 360bbb-3(b)(1), unless the authorization is terminated or revoked sooner.    Influenza A by PCR NEGATIVE NEGATIVE Final   Influenza B by PCR NEGATIVE NEGATIVE Final    Comment: (NOTE) The Xpert Xpress SARS-CoV-2/FLU/RSV assay is intended as an aid in  the diagnosis of influenza from Nasopharyngeal swab specimens and  should not be used as a sole basis for treatment. Nasal washings and  aspirates are unacceptable for Xpert Xpress SARS-CoV-2/FLU/RSV  testing. Fact Sheet for Patients: PinkCheek.be Fact Sheet for Healthcare Providers: GravelBags.it This test is not yet approved or cleared by the [place] FDA and  has been authorized for detection and/or diagnosis of SARS-CoV-2 by  FDA under an Emergency Use Authorization (EUA). This EUA will remain  in effect (meaning this test can be used) for the duration of the  Covid-19 declaration under Section 564(b)(1) of the Act, 21  U.S.C. section 360bbb-3(b)(1), unless the authorization is  terminated or revoked. Performed at [facility], [place] [address] [place], [place] [address]   Culture, blood (Routine X 2) w Reflex to ID Panel     Status: None (Preliminary result)   Collection Time: 08/02/19  1:40 AM   Specimen: BLOOD  Result Value Ref Range Status   Specimen Description   Final    BLOOD LEFT ANTECUBITAL Performed at [facility] [address] [place], [place] [address]    Special Requests   Final    BOTTLES DRAWN AEROBIC AND ANAEROBIC Blood Culture adequate volume Performed at [facility] [address], [place], [place] [address]    Culture   Final    NO GROWTH 1 DAY Performed at [facility], [place] [address] [place], [place] [address]    Report Status PENDING  Incomplete  Culture, blood (Routine X 2) w Reflex to ID Panel     Status: None (Preliminary result)   Collection Time: 08/02/19  1:45 AM   Specimen: BLOOD  Result Value Ref Range Status   Specimen Description   Final    BLOOD LEFT ANTECUBITAL Performed at [facility] [address] [place], [place] [address]    Special Requests  Final    BOTTLES DRAWN AEROBIC AND ANAEROBIC Blood Culture adequate volume Performed at [facility] [address], [place], [place] [address]    Culture   Final    NO GROWTH 1 DAY Performed at [facility], [place] [address], [place], [place] [address]    Report Status PENDING  Incomplete  MRSA PCR Screening     Status: None   Collection Time: 08/03/19  5:35 PM   Specimen: Nasal Mucosa; Nasopharyngeal  Result Value Ref Range Status   MRSA by PCR NEGATIVE NEGATIVE Final    Comment:        The GeneXpert MRSA Assay (FDA approved for NASAL specimens only), is one component of a comprehensive MRSA colonization surveillance program. It is not intended to diagnose MRSA infection nor to guide or monitor treatment for MRSA infections. Performed at [facility], [place] [address] [place], [place] [address]      Radiology Studies: MR BRAIN WO CONTRAST  Result Date: 08/03/2019 CLINICAL DATA:  Encephalopathy.  Altered mental status EXAM: MRI HEAD WITHOUT CONTRAST TECHNIQUE: Multiplanar, multiecho pulse sequences of the brain and surrounding structures were obtained without intravenous contrast. COMPARISON:  CT head 08/01/2019 FINDINGS: Brain:  Incomplete study degraded by motion. Patient not able to complete the examination. 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 Several small areas of intermediate signal on diffusion-weighted imaging in the parietal lobes and left temporoparietal lobe appear to be chronic ischemia. Generalized atrophy, moderate. Moderate changes throughout the white matter compatible chronic microvascular ischemia. No fluid collection or midline shift. Chronic ischemic changes also in the pons and cerebellum bilaterally. Chronic ischemia in the right caudate head. Vascular: Normal arterial flow voids Skull and upper cervical spine: No focal skeletal lesion. Sinuses/Orbits: Paranasal sinuses clear. Bilateral cataract extraction Other: None IMPRESSION: 3 mm acute infarct right mid frontal cortex. Atrophy and extensive chronic  ischemia. Electronically Signed   By: Franchot Gallo M.D.   On: 08/03/2019 17:17     Scheduled Meds: . amLODipine  5 mg Oral Daily  . aspirin  300 mg Rectal Daily   Or  . aspirin  325 mg Oral Daily  . clopidogrel  75 mg Oral Daily  . enoxaparin (LOVENOX) injection  30 mg Subcutaneous Q24H  . feeding supplement (ENSURE ENLIVE)  237 mL Oral BID BM  . fenofibrate  54 mg Oral Daily  . fluticasone furoate-vilanterol  1 puff Inhalation Daily  . insulin aspart  0-9 Units Subcutaneous TID WC  . LORazepam  0.5 mg Intravenous Once  . losartan  50 mg Oral Daily  . metoprolol tartrate  50 mg Oral BID  . sodium chloride flush  3 mL Intravenous Q12H   Continuous Infusions: . cefTRIAXone (ROCEPHIN)  IV 2 g (08/04/19 0603)     LOS: 2 days    Time spent: 25 mins.    Shawna Clamp, MD [facility]   If 7PM-7AM, please contact night-coverage

## 2019-08-04 NOTE — Evaluation (Signed)
Clinical/Bedside Swallow Evaluation Patient Details  Name: Makkah Vandegrift MRN: [id number] Date of Birth: 07-Jul-1940  Today's Date: 08/04/2019 Time: SLP Start Time (ACUTE ONLY): 1155 SLP Stop Time (ACUTE ONLY): 1207 SLP Time Calculation (min) (ACUTE ONLY): 12 min  Past Medical History:  Past Medical History:  Diagnosis Date  . Back pain   . Carotid artery stenosis   . Cognitive decline   . Diabetes mellitus without complication ([place])   . Hypertension   . Renal disorder    Past Surgical History: History reviewed. No pertinent surgical history. HPI:  79 yo female adm to [facility] with AMS, HTN crisis.  Pt PMH + for COPD, dementia, HTN, CKD, tobacco use,   Pt with increased confusion compared to normal per son's statement to RN yesterday.  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  Speech/swallow eval ordered. Pt stays at [facility].   Assessment / Plan / Recommendation Clinical Impression  Patient presents with cognitive based dysphagia most c/b indications of oral holding with delayed transiting most notably with liquids. Pt swallowing followed by throat clearing intermittently but no overt coughing or indication of aspiration. She was able to self feed although at times would drop items and confusion worsened her swallowing ability.   Also question if pt may have beginnings of oral candidiasis as she has few small areas of whitish patches bilaterally and has been started on an ABX.    Recommend softer/thin diet with intermittent supervision.   Will follow up to assure tolerance of po given her new right frontal CVA and cognitive deficits.  SLP Visit Diagnosis: Dysphagia, oropharyngeal phase (R13.12)    Aspiration Risk  Mild aspiration risk    Diet Recommendation Dysphagia 3 (Mech soft);Thin liquid   Liquid Administration via: Cup;Straw Medication Administration: (with puree if problematic) Supervision: Patient able to self  feed;Intermittent supervision to cue for compensatory strategies Compensations: Slow rate;Small sips/bites Postural Changes: Seated upright at 90 degrees;Remain upright for at least 30 minutes after po intake    Other  Recommendations Oral Care Recommendations: Oral care BID   Follow up Recommendations Other (comment)(tbd)      Frequency and Duration min 1 x/week  2 weeks       Prognosis Prognosis for Safe Diet Advancement: Fair Barriers to Reach Goals: Cognitive deficits      Swallow Study   General Date of Onset: 08/04/19 HPI: 79 yo female adm to [facility] with AMS, HTN crisis.  Pt PMH + for COPD, dementia, HTN, CKD, tobacco use,   Pt with increased confusion compared to normal per son's statement to RN yesterday.  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  Speech/swallow eval ordered. Pt stays at [facility]. Type of Study: Bedside Swallow Evaluation Diet Prior to this Study: NPO Temperature Spikes Noted: No Respiratory Status: Nasal cannula History of Recent Intubation: No Behavior/Cognition: Alert;Other (Comment);Doesn't follow directions;Requires cueing(decreased ability to follow directions) Oral Cavity Assessment: Within Functional Limits(? minimal white spots bilateral - sulci - pt without complaint of pain, ? if could be consistent with beginnings of oral candidiasis) Oral Care Completed by SLP: Other (Comment)(had pt brush her own teeth with assist) Oral Cavity - Dentition: Adequate natural dentition Vision: Functional for self-feeding Self-Feeding Abilities: Able to feed self(will drop items at times and thus Intermittent supervision advised) Patient Positioning: Upright in chair Baseline Vocal Quality: Normal Volitional  Cough: Cognitively unable to elicit Volitional Swallow: Unable to elicit    Oral/Motor/Sensory Function Overall Oral Motor/Sensory Function: Mild impairment(pt did not follow directions, ?  mild right facial asymmetry, bilateral palatal elevation) Facial ROM: Within Functional Limits Facial Symmetry: Abnormal symmetry right Facial Strength: Within Functional Limits Lingual ROM: Within Functional Limits Lingual Strength: Within Functional Limits Velum: Within Functional Limits   Ice Chips Ice chips: Not tested   Thin Liquid Thin Liquid: Impaired Oral Phase Impairments: Poor awareness of bolus Oral Phase Functional Implications: Oral holding Pharyngeal  Phase Impairments: Throat Clearing - Immediate;Throat Clearing - Delayed    Nectar Thick Nectar Thick Liquid: Not tested   Honey Thick Honey Thick Liquid: Not tested   Puree Puree: Within functional limits Presentation: Self Fed;Spoon   Solid     Solid: Impaired Presentation: Self Fed Oral Phase Impairments: Poor awareness of bolus Oral Phase Functional Implications: Oral holding Pharyngeal Phase Impairments: Throat Clearing - Immediate      Macario Golds 08/04/2019,1:55 PM   Kathleen Lime, MS [place] Office [phone]

## 2019-08-04 NOTE — Evaluation (Signed)
Occupational Therapy Evaluation Patient Details Name: Chloe Fletcher MRN: [id number] DOB: 08/24/1940 Today's Date: 08/04/2019    History of Present Illness 79 yo female admitted with hypertensive urgency. MRI + acute R mid frontal cortex. Hx of dementia, DM, CKD, COPD   Clinical Impression   Patient is a 79 year old female per chart resides at assisted living facility. Patient is unable to provide history due to cognitive impairments, therefore unsure of baseline. Currently patient is min A for functional mobility and transfers with hand held Ax1 due to decreased safety awareness and balance. Patient requires max cues to redirect to tasks as she is highly distractible and speaking in non-sensical statements. Currently patient will require 24/7 supervision and assist for safety. Will continue with acute OT services to maximize patient safety and independence with self care.    Follow Up Recommendations  Supervision/Assistance - 24 hour;Home health OT;Other (comment)(vs SNF if 24/7 supervision cannot be provided at ALF)    Equipment Recommendations  None recommended by OT       Precautions / Restrictions Precautions Precautions: Fall Restrictions Weight Bearing Restrictions: No      Mobility Bed Mobility Overal bed mobility: Needs Assistance Bed Mobility: Sit to Supine       Sit to supine: Supervision   General bed mobility comments: requires multiple cues to lay down  Transfers Overall transfer level: Needs assistance Equipment used: 1 person hand held assist Transfers: Sit to/from Stand Sit to Stand: Min assist         General transfer comment: cues required for direction and to stay on task    Balance Overall balance assessment: Needs assistance Sitting-balance support: No upper extremity supported Sitting balance-Leahy Scale: Good Sitting balance - Comments: supervision for safety   Standing balance support: Single extremity supported;During functional  activity Standing balance-Leahy Scale: Fair                             ADL either performed or assessed with clinical judgement   ADL Overall ADL's : Needs assistance/impaired Eating/Feeding: Set up Eating/Feeding Details (indicate cue type and reason): patient able to eat ice cream with set up of utensils Grooming: Wash/dry face;Wash/dry hands;Standing Grooming Details (indicate cue type and reason): verbal cues for thoroughness Upper Body Bathing: Supervision/ safety   Lower Body Bathing: Min guard;Minimal assistance   Upper Body Dressing : Supervision/safety   Lower Body Dressing: Min guard;Minimal assistance Lower Body Dressing Details (indicate cue type and reason): attempt to direct patient to doff sock, patient reaches down to adjust her socks but does not follow through with commands, min guard to min A in standing for balance Toilet Transfer: Minimal assistance;Cueing for safety;Cueing for sequencing;Ambulation;Regular Glass blower/designer Details (indicate cue type and reason): 1 hand held assist, decreased safety awareness Toileting- Clothing Manipulation and Hygiene: Moderate assistance;Sitting/lateral lean;Sit to/from stand Toileting - Clothing Manipulation Details (indicate cue type and reason): patient attempts to sit on toilet before lowering her underwear and walks away from toilet without pulling up underwear after voiding, able to perform peri care seated.     Functional mobility during ADLs: Cueing for safety;Cueing for sequencing;Minimal assistance General ADL Comments: unsure of patient's baseline however currently requires min A for functional transfers/ambulation and 24/7 supervision due to safety and decreased cognition.                  Pertinent Vitals/Pain Pain Assessment: Faces Pain Score: 0 Faces Pain Scale: No hurt  Hand Dominance Right   Extremity/Trunk Assessment Upper Extremity Assessment Upper Extremity Assessment:  Difficult to assess due to impaired cognition   Lower Extremity Assessment Lower Extremity Assessment: Defer to PT evaluation       Communication Communication Communication: HOH   Cognition Arousal/Alertness: Awake/alert Behavior During Therapy: (tangential) Overall Cognitive Status: Impaired/Different from baseline Area of Impairment: Orientation;Attention;Memory;Following commands;Safety/judgement;Awareness;Problem solving                 Orientation Level: Disoriented to;Place;Time;Situation Current Attention Level: Focused Memory: Decreased short-term memory Following Commands: Follows one step commands inconsistently;Follows one step commands with increased time Safety/Judgement: Decreased awareness of safety;Decreased awareness of deficits Awareness: Intellectual Problem Solving: Slow processing;Difficulty sequencing;Requires verbal cues;Requires tactile cues                Home Living Family/patient expects to be discharged to:: Unsure     Type of Home: Assisted living                                  Prior Functioning/Environment          Comments: Unsure of prior level of function, patient unable to provide history.        OT Problem List: Decreased strength;Decreased activity tolerance;Impaired balance (sitting and/or standing);Decreased cognition;Decreased safety awareness      OT Treatment/Interventions: Self-care/ADL training;Therapeutic exercise;Cognitive remediation/compensation;Therapeutic activities;Balance training;Patient/family education    OT Goals(Current goals can be found in the care plan section) Acute Rehab OT Goals Patient Stated Goal: unable OT Goal Formulation: Patient unable to participate in goal setting Time For Goal Achievement: 08/18/19 Potential to Achieve Goals: Good  OT Frequency: Min 2X/week    AM-PAC OT "6 Clicks" Daily Activity     Outcome Measure Help from another person eating meals?: A  Little Help from another person taking care of personal grooming?: A Little Help from another person toileting, which includes using toliet, bedpan, or urinal?: A Lot Help from another person bathing (including washing, rinsing, drying)?: A Little Help from another person to put on and taking off regular upper body clothing?: A Little Help from another person to put on and taking off regular lower body clothing?: A Little 6 Click Score: 17   End of Session Nurse Communication: Mobility status  Activity Tolerance: Patient tolerated treatment well Patient left: in bed;with call bell/phone within reach;with bed alarm set  OT Visit Diagnosis: Unsteadiness on feet (R26.81);Muscle weakness (generalized) (M62.81);Other symptoms and signs involving cognitive function                Time: [id number] OT Time Calculation (min): 23 min Charges:  OT General Charges $OT Visit: 1 Visit OT Evaluation $OT Eval Moderate Complexity: 1 Mod OT Treatments $Self Care/Home Management : 8-22 mins  Shon Millet OT OT office: Benjamin Perez 08/04/2019, 2:00 PM

## 2019-08-05 LAB — HEMOGLOBIN A1C
Hgb A1c MFr Bld: 5.7 % — ABNORMAL HIGH (ref 4.8–5.6)
Mean Plasma Glucose: 116.89 mg/dL

## 2019-08-05 LAB — GLUCOSE, CAPILLARY
Glucose-Capillary: 138 mg/dL — ABNORMAL HIGH (ref 70–99)
Glucose-Capillary: 216 mg/dL — ABNORMAL HIGH (ref 70–99)
Glucose-Capillary: 96 mg/dL (ref 70–99)
Glucose-Capillary: 102 mg/dL — ABNORMAL HIGH (ref 70–99)

## 2019-08-05 LAB — BASIC METABOLIC PANEL
Anion gap: 16 — ABNORMAL HIGH (ref 5–15)
BUN: 23 mg/dL (ref 8–23)
CO2: 23 mmol/L (ref 22–32)
Calcium: 9 mg/dL (ref 8.9–10.3)
Chloride: 97 mmol/L — ABNORMAL LOW (ref 98–111)
Creatinine, Ser: 1.34 mg/dL — ABNORMAL HIGH (ref 0.44–1.00)
GFR calc Af Amer: 44 mL/min — ABNORMAL LOW (ref >60)
GFR calc non Af Amer: 38 mL/min — ABNORMAL LOW (ref >60)
Glucose, Bld: 132 mg/dL — ABNORMAL HIGH (ref 70–99)
Potassium: 3.4 mmol/L — ABNORMAL LOW (ref 3.5–5.1)
Sodium: 136 mmol/L (ref 135–145)

## 2019-08-05 LAB — CBC
HCT: 34.1 % — ABNORMAL LOW (ref 36.0–46.0)
Hemoglobin: 11.3 g/dL — ABNORMAL LOW (ref 12.0–15.0)
MCH: 31.9 pg (ref 26.0–34.0)
MCHC: 33.1 g/dL (ref 30.0–36.0)
MCV: 96.3 fL (ref 80.0–100.0)
Platelets: 350 10*3/uL (ref 150–400)
RBC: 3.54 MIL/uL — ABNORMAL LOW (ref 3.87–5.11)
RDW: 12.8 % (ref 11.5–15.5)
WBC: 7.5 10*3/uL (ref 4.0–10.5)
nRBC: 0 % (ref 0.0–0.2)

## 2019-08-05 LAB — LIPID PANEL
Cholesterol: 241 mg/dL — ABNORMAL HIGH (ref 0–200)
HDL: 48 mg/dL (ref >40)
LDL Cholesterol: 156 mg/dL — ABNORMAL HIGH (ref 0–99)
Total CHOL/HDL Ratio: 5 RATIO
Triglycerides: 184 mg/dL — ABNORMAL HIGH (ref <150)
VLDL: 37 mg/dL (ref 0–40)

## 2019-08-05 LAB — MAGNESIUM: Magnesium: 2.1 mg/dL (ref 1.7–2.4)

## 2019-08-05 LAB — PHOSPHORUS: Phosphorus: 4.1 mg/dL (ref 2.5–4.6)

## 2019-08-05 MED ORDER — POTASSIUM CHLORIDE CRYS ER 20 MEQ PO TBCR
20.0000 meq | EXTENDED_RELEASE_TABLET | Freq: Once | ORAL | Status: AC
  Administered 2019-08-05: 20 meq via ORAL
  Filled 2019-08-05: qty 1

## 2019-08-05 NOTE — Plan of Care (Signed)
Patient continues to be very confused, difficult to reorient and redirect.  Continually walking around room and not following instructions.  Ativan given for anxiety with some improvement.

## 2019-08-05 NOTE — Progress Notes (Addendum)
PROGRESS NOTE  Chloe Fletcher [id number] DOB: [birth year] DOA: 08/01/2019 PCP: Dulce Sellar, MD  HPI/Recap of past 24 hours: Per HPI on admission:Chloe Fletcher a 79 y.o.femalewith medical history significant fordementia, hypertension, type 2 diabetes mellitus, stage III chronic kidney disease, chronic tobacco abuse, COPD,who is admitted to [facility] on 2/3/2021with hypertensive crisisafter [facility] Department[facility] of elevated blood pressure.  Blood pressure has finally settled down with resumption of her blood pressure medications.  She continues to remain confused, found to have a UTI,  started on antibiotics.  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  Neurology is following.  August 05, 2019. Subjective: Patient seen and examined at bedside continues to be confused nurse reported patient was pulling on her IV lines     Assessment/Plan: Principal Problem:   Hypertensive crisis Active Problems:   Acute cystitis   Diabetes mellitus without complication (HCC)   CKD (chronic kidney disease) stage 3, GFR 30-59 ml/min   COPD (chronic obstructive pulmonary disease) (HCC)   Hypertensive urgency  1.  Hypertensive urgency improved  2.  Acute stroke. Patient presented with worsening confusion with poor cognition and no focal deficit MRI of the head showed 3 mm acute infarct in the right mid frontal cortex neurology suggested full work-up.  TEE carotid Doppler and MRI brain ordered neurology is following  3.  Urinary tract infection continue ceftriaxone We will follow up with urine cultures  4.  Dementia with confusion.  Notes that son stated that her cognition has significantly declined in the past 1 month.  Neurology following  5.  Chronic kidney disease  stage III she is at baseline  6.  COPD patient is not in exacerbation continue home albuterol and elliptica  7.  Type 2 diabetes mellitus with holding her home oral medication continue sliding scale insulin  8.  Nonobstructing left renal stone nonsymptomatic.  She will follow up with urology as outpatient  9.  Mild hypokalemia of 3.4.  I will replace with 20 mEq x 1.  We will recheck in the morning    Severity of Illness: The appropriate patient status for this patient is INPATIENT. Inpatient status is judged to be reasonable and necessary in order to provide the required intensity of service to ensure the patient's safety. The patient's presenting symptoms, physical exam findings, and initial radiographic and laboratory data in the context of their chronic comorbidities is felt to place them at high risk for further clinical deterioration. Furthermore, it is not anticipated that the patient will be medically stable for discharge from the hospital within 2 midnights of admission. The following factors support the patient status of inpatient.   " The patient's presenting symptoms include hypertensive emergency. " The worrisome physical exam findings include elevated blood pressure confusion. " The initial radiographic and laboratory data are worrisome because of urinary tract infection. " The chronic co-morbidities include dementia.   * I certify that at the point of admission it is my clinical judgment that the patient will require inpatient hospital care spanning beyond 2 midnights from the point of admission due to high intensity of service, high risk for further deterioration and high frequency of surveillance required.*    DVT prophylaxis:  Code Status: Full code Family Communication: Discussed with son over the phone in detail. Disposition Plan: Anticipated discharge to  assisted living facility pending medical clearance. Barriers: Continued medical work-up for  confusion  Consultants:   Neurology  Procedures:  None Antimicrobials:  Ceftriaxone   Objective: Vitals:   08/04/19 2114 08/05/19 0002 08/05/19 0440 08/05/19 0500  BP: (!) 209/68 (!) 181/68 (!) 204/55   Pulse: 84 68 72   Resp: 20 20    Temp: 98.3 F (36.8 C) 98.1 F (36.7 C) 98.5 F (36.9 C)   TempSrc: Oral Oral Oral   SpO2: 99% 96% 98%   Weight:    53.8 kg  Height:        Intake/Output Summary (Last 24 hours) at 08/05/2019 0941 Last data filed at 08/05/2019 0600 Gross per 24 hour  Intake 873 ml  Output 800 ml  Net 73 ml   Filed Weights   08/01/19 1426 08/02/19 0500 08/05/19 0500  Weight: 54.4 kg 55.8 kg 53.8 kg   Body mass index is 21.69 kg/m.  Exam:  . General: 78 y.o. year-old female well developed well nourished in no acute distress.  Alert and oriented x3. . Cardiovascular: Regular rate and rhythm with no rubs or gallops.  No thyromegaly or JVD noted.   Marland Kitchen Respiratory: Clear to auscultation with no wheezes or rales. Good inspiratory effort. . Abdomen: Soft nontender nondistended with normal bowel sounds x4 quadrants. . Musculoskeletal: No lower extremity edema. 2/4 pulses in all 4 extremities. . Skin: No ulcerative lesions noted or rashes, . Psychiatry: Mood is appropriate for condition and setting    Data Reviewed: CBC: Recent Labs  Lab 08/01/19 1508 08/02/19 0516 08/03/19 0507 08/04/19 0415 08/05/19 0454  WBC 7.7 8.0 6.3 5.7 7.5  HGB 11.2* 10.9* 10.2* 10.2* 11.3*  HCT 33.9* 32.5* 31.0* 30.9* 34.1*  MCV 97.4 95.6 97.2 97.8 96.3  PLT 354 307 321 329 [id number]   Basic Metabolic Panel: Recent Labs  Lab 08/01/19 1508 08/02/19 0516 08/03/19 0507 08/04/19 0415 08/05/19 0454  NA 135 133* 135 137 136  K 3.5 3.3* 3.9 3.9 3.4*  CL 100 100 104 105 97*  CO2 27 23 22 23 23   GLUCOSE 134* 134* 114* 109* 132*  BUN 22 20 26* 29* 23  CREATININE 1.38* 1.47* 1.60* 1.68* 1.34*  CALCIUM 9.2 9.1 9.1 8.9 9.0  MG  --  1.8 2.1 2.0 2.1  PHOS  --   --  4.6 4.4  4.1   GFR: Estimated Creatinine Clearance: 27.4 mL/min (A) (by C-G formula based on SCr of 1.34 mg/dL (H)). Liver Function Tests: Recent Labs  Lab 08/01/19 1508 08/02/19 0516 08/03/19 0507  AST 17 16 14*  ALT 13 13 13   ALKPHOS 49 47 42  BILITOT 0.9 0.5 0.5  PROT 6.5 5.9* 5.6*  ALBUMIN 3.4* 3.1* 2.7*   Recent Labs  Lab 08/01/19 1508  LIPASE 36   No results for input(s): AMMONIA in the last 168 hours. Coagulation Profile: No results for input(s): INR, PROTIME in the last 168 hours. Cardiac Enzymes: No results for input(s): CKTOTAL, CKMB, CKMBINDEX, TROPONINI in the last 168 hours. BNP (last 3 results) No results for input(s): PROBNP in the last 8760 hours. HbA1C: Recent Labs    08/05/19 0454  HGBA1C 5.7*   CBG: Recent Labs  Lab 08/04/19 0742 08/04/19 1156 08/04/19 1633 08/04/19 2119 08/05/19 0745  GLUCAP 111* 119* 105* 117* 138*   Lipid Profile: Recent Labs    08/05/19 0454  CHOL 241*  HDL 48  LDLCALC 156*  TRIG 184*  CHOLHDL 5.0   Thyroid Function Tests: No  results for input(s): TSH, T4TOTAL, FREET4, T3FREE, THYROIDAB in the last 72 hours. Anemia Panel: Recent Labs    08/03/19 1511  VITAMINB12 288   Urine analysis:    Component Value Date/Time   COLORURINE YELLOW 08/01/2019 2007   APPEARANCEUR CLEAR 08/01/2019 2007   LABSPEC 1.030 08/01/2019 2007   PHURINE 6.0 08/01/2019 2007   GLUCOSEU NEGATIVE 08/01/2019 2007   HGBUR NEGATIVE 08/01/2019 2007   [place] NEGATIVE 08/01/2019 2007   [place] NEGATIVE 08/01/2019 2007   PROTEINUR >=300 (A) 08/01/2019 2007   NITRITE NEGATIVE 08/01/2019 2007   LEUKOCYTESUR MODERATE (A) 08/01/2019 2007   Sepsis Labs: @LABRCNTIP (procalcitonin:4,lacticidven:4)  ) Recent Results (from the past 240 hour(s))  Culture, Urine     Status: Abnormal   Collection Time: 08/01/19  8:07 PM   Specimen: Urine, Clean Catch  Result Value Ref Range Status   Specimen Description   Final    URINE, CLEAN CATCH Performed  at [facility], [place] [address], [place], [place] [address]    Special Requests   Final    NONE Performed at [facility], [place] [address] [place], [place] [address]    Culture (A)  Final    <10,000 COLONIES/mL INSIGNIFICANT GROWTH Performed at [place] [address], [place], [place] [address]    Report Status 08/03/2019 FINAL  Final  Respiratory Panel by RT PCR (Flu A&B, Covid) - Nasopharyngeal Swab     Status: None   Collection Time: 08/02/19 12:26 AM   Specimen: Nasopharyngeal Swab  Result Value Ref Range Status   SARS Coronavirus 2 by RT PCR NEGATIVE NEGATIVE Final    Comment: (NOTE) SARS-CoV-2 target nucleic acids are NOT DETECTED. The SARS-CoV-2 RNA is generally detectable in upper respiratoy specimens during the acute phase of infection. The lowest concentration of SARS-CoV-2 viral copies this assay can detect is 131 copies/mL. A negative result does not preclude SARS-Cov-2 infection and should not be used as the sole basis for treatment or other patient management decisions. A negative result may occur with  improper specimen collection/handling, submission of specimen other than nasopharyngeal swab, presence of viral mutation(s) within the areas targeted by this assay, and inadequate number of viral copies (<131 copies/mL). A negative result must be combined with clinical observations, patient history, and epidemiological information. The expected result is Negative. Fact Sheet for Patients:  PinkCheek.be Fact Sheet for Healthcare Providers:  GravelBags.it This test is not yet ap proved or cleared by the [place] FDA and  has been authorized for detection and/or diagnosis of SARS-CoV-2 by FDA under an Emergency Use Authorization (EUA). This EUA will remain  in effect (meaning this test can be used) for the duration of the COVID-19 declaration under Section  564(b)(1) of the Act, 21 U.S.C. section 360bbb-3(b)(1), unless the authorization is terminated or revoked sooner.    Influenza A by PCR NEGATIVE NEGATIVE Final   Influenza B by PCR NEGATIVE NEGATIVE Final    Comment: (NOTE) The Xpert Xpress SARS-CoV-2/FLU/RSV assay is intended as an aid in  the diagnosis of influenza from Nasopharyngeal swab specimens and  should not be used as a sole basis for treatment. Nasal washings and  aspirates are unacceptable for Xpert Xpress SARS-CoV-2/FLU/RSV  testing. Fact Sheet for Patients: PinkCheek.be Fact Sheet for Healthcare Providers: GravelBags.it This test is not yet approved or cleared by the [place] FDA and  has been authorized for detection and/or diagnosis of SARS-CoV-2 by  FDA under an Emergency Use Authorization (EUA).  This EUA will remain  in effect (meaning this test can be used) for the duration of the  Covid-19 declaration under Section 564(b)(1) of the Act, 21  U.S.C. section 360bbb-3(b)(1), unless the authorization is  terminated or revoked. Performed at [facility], [place] [address], [place], [place] [address]   Culture, blood (Routine X 2) w Reflex to ID Panel     Status: None (Preliminary result)   Collection Time: 08/02/19  1:40 AM   Specimen: BLOOD  Result Value Ref Range Status   Specimen Description   Final    BLOOD LEFT ANTECUBITAL Performed at [place] [address], [place], [place] [address]    Special Requests   Final    BOTTLES DRAWN AEROBIC AND ANAEROBIC Blood Culture adequate volume Performed at [place] [address], [place], [place] [address]    Culture   Final    NO GROWTH 3 DAYS Performed at [facility], [place] [address], [place], [place] [address]    Report Status PENDING  Incomplete  Culture, blood (Routine X 2) w Reflex to ID Panel     Status: None (Preliminary result)    Collection Time: 08/02/19  1:45 AM   Specimen: BLOOD  Result Value Ref Range Status   Specimen Description   Final    BLOOD LEFT ANTECUBITAL Performed at [place] [address], [place], [place] [address]    Special Requests   Final    BOTTLES DRAWN AEROBIC AND ANAEROBIC Blood Culture adequate volume Performed at [place] [address], [place], [place] [address]    Culture   Final    NO GROWTH 3 DAYS Performed at [facility], [place] [address] [place], [place] [address]    Report Status PENDING  Incomplete  MRSA PCR Screening     Status: None   Collection Time: 08/03/19  5:35 PM   Specimen: Nasal Mucosa; Nasopharyngeal  Result Value Ref Range Status   MRSA by PCR NEGATIVE NEGATIVE Final    Comment:        The GeneXpert MRSA Assay (FDA approved for NASAL specimens only), is one component of a comprehensive MRSA colonization surveillance program. It is not intended to diagnose MRSA infection nor to guide or monitor treatment for MRSA infections. Performed at [facility], [place] [address] [place], [place] [address]       Studies: ECHOCARDIOGRAM COMPLETE BUBBLE STUDY  Result Date: 08/04/2019   ECHOCARDIOGRAM REPORT   Patient Name:   TABRESHA RAINES Date of Exam: 08/04/2019 Medical Rec #:  [id number]   Height:       62.0 in Accession #:    [id number]  Weight:       123.0 lb Date of Birth:  05/13/1941   BSA:          1.55 m Patient Age:    72 years    BP:           186/72 mmHg Patient Gender: F           HR:           61 bpm. Exam Location:  Inpatient Procedure: 2D Echo, Color Doppler, Cardiac Doppler and Saline Contrast Bubble            Study Indications:    Stroke  History:        Patient has no prior history of Echocardiogram examinations.  Signs/Symptoms:Dementia; Risk Factors:Hypertension and Diabetes.  Sonographer:    Maudry Mayhew MHA, RDMS, RVT, RDCS Referring Phys: [id number] [facility]   Sonographer Comments: Technically difficult study due to poor echo windows. Image acquisition challenging due to patient body habitus and Image acquisition challenging due to uncooperative patient. IMPRESSIONS  1. Technically difficult study. Bubble study appears negative, with no evidence of interatrial shunt  2. Left ventricular ejection fraction, by visual estimation, is 60 to 65%. The left ventricle has normal function. There is moderately increased left ventricular hypertrophy.  3. Left ventricular diastolic parameters are consistent with Grade II diastolic dysfunction (pseudonormalization).  4. Elevated left atrial pressure.  5. Global right ventricle has normal systolic function.The right ventricular size is mildly enlarged.  6. The mitral valve is normal in structure. No evidence of mitral valve regurgitation.  7. The tricuspid valve is grossly normal. Tricuspid valve regurgitation is trivial.  8. The aortic valve was not well visualized. Aortic valve regurgitation is trivial. No evidence of aortic valve stenosis.  9. The pulmonic valve was not well visualized. Pulmonic valve regurgitation is not visualized. 10. Presence of pericardial fat pad. 11. The inferior vena cava is normal in size with <50% respiratory variability, suggesting right atrial pressure of 8 mmHg. 12. The tricuspid regurgitant velocity is 2.80 m/s, and with an assumed right atrial pressure of 8 mmHg, the estimated right ventricular systolic pressure is mildly elevated at 39.4 mmHg. FINDINGS  Left Ventricle: Left ventricular ejection fraction, by visual estimation, is 60 to 65%. The left ventricle has normal function. The left ventricle has no regional wall motion abnormalities. There is moderately increased left ventricular hypertrophy. Left ventricular diastolic parameters are consistent with Grade II diastolic dysfunction (pseudonormalization). Elevated left atrial pressure. Right Ventricle: The right ventricular size is mildly  enlarged. No increase in right ventricular wall thickness. Global RV systolic function is has normal systolic function. The tricuspid regurgitant velocity is 2.80 m/s, and with an assumed right atrial  pressure of 8 mmHg, the estimated right ventricular systolic pressure is mildly elevated at 39.4 mmHg. Left Atrium: Left atrial size was normal in size. Right Atrium: Right atrial size was not well visualized Pericardium: There is no evidence of pericardial effusion. Presence of pericardial fat pad. Mitral Valve: The mitral valve is normal in structure. No evidence of mitral valve regurgitation. Tricuspid Valve: The tricuspid valve is grossly normal. Tricuspid valve regurgitation is trivial. Aortic Valve: The aortic valve was not well visualized. Aortic valve regurgitation is trivial. The aortic valve is structurally normal, with no evidence of sclerosis or stenosis. Pulmonic Valve: The pulmonic valve was not well visualized. Pulmonic valve regurgitation is not visualized. Pulmonic regurgitation is not visualized. Aorta: The aortic root is normal in size and structure. Venous: The inferior vena cava is normal in size with less than 50% respiratory variability, suggesting right atrial pressure of 8 mmHg. IAS/Shunts: No atrial level shunt detected by color flow Doppler. Agitated saline contrast was given intravenously to evaluate for intracardiac shunting. Saline contrast bubble study was negative, with no evidence of any interatrial shunt.  TRICUSPID VALVE TR Peak grad:   31.4 mmHg TR Vmax:        280.00 cm/s  Oswaldo Milian MD Electronically signed by Oswaldo Milian MD Signature Date/Time: 08/04/2019/8:49:41 PM    Final    VAS US CAROTID (at [facility] and [place] only)  Result Date: 08/04/2019 Carotid Arterial Duplex Study Indications: CVA. Limitations  Today's exam was limited due to the patient's inability or  unwillingness to cooperate. Performing Technologist: Maudry Mayhew MHA, RDMS, RVT, RDCS   Examination Guidelines: A complete evaluation includes B-mode imaging, spectral Doppler, color Doppler, and power Doppler as needed of all accessible portions of each vessel. Bilateral testing is considered an integral part of a complete examination. Limited examinations for reoccurring indications may be performed as noted.  Right Carotid Findings: +----------+-------+-------+--------+---------------------------------+--------+           PSV    EDV    StenosisPlaque Description               Comments           cm/s   cm/s                                                     +----------+-------+-------+--------+---------------------------------+--------+ CCA Prox  82                                                              +----------+-------+-------+--------+---------------------------------+--------+ CCA Distal107    7              smooth and heterogenous                   +----------+-------+-------+--------+---------------------------------+--------+ ICA Prox  169    11             smooth, heterogenous and calcific         +----------+-------+-------+--------+---------------------------------+--------+ ICA Distal84     5                                                        +----------+-------+-------+--------+---------------------------------+--------+ ECA       123                   heterogenous, irregular and                                               calcific                                  +----------+-------+-------+--------+---------------------------------+--------+ +----------+--------+-------+--------+-------------------+           PSV cm/sEDV cmsDescribeArm Pressure (mmHG) +----------+--------+-------+--------+-------------------+ [id number]                                        +----------+--------+-------+--------+-------------------+ +---------+--------+--+--------+---------+ VertebralPSV cm/s20EDV cm/sAntegrade  +---------+--------+--+--------+---------+  Left Carotid Findings: +----------+--------+--------+--------+-------------------------+--------------+           PSV cm/sEDV cm/sStenosisPlaque Description       Comments       +----------+--------+--------+--------+-------------------------+--------------+ CCA Prox  92                                                              +----------+--------+--------+--------+-------------------------+--------------+  CCA Distal103                                                             +----------+--------+--------+--------+-------------------------+--------------+ ICA Prox  116                     heterogenous and                                                          irregular                               +----------+--------+--------+--------+-------------------------+--------------+ ICA Distal98      9                                                       +----------+--------+--------+--------+-------------------------+--------------+ ECA                                                        Not visualized +----------+--------+--------+--------+-------------------------+--------------+ +----------+--------+--------+---------+-------------------+           PSV cm/sEDV cm/sDescribe Arm Pressure (mmHG) +----------+--------+--------+---------+-------------------+ [id number]             Turbulent                    +----------+--------+--------+---------+-------------------+ +---------+--------+--+--------+--+---------+ VertebralPSV cm/s52EDV cm/s10Antegrade +---------+--------+--+--------+--+---------+   Summary: Right Carotid: Velocities in the right ICA are consistent with a 1-39% stenosis. Left Carotid: Velocities in the left ICA are consistent with a 1-39% stenosis. Vertebrals:  Bilateral vertebral arteries demonstrate antegrade flow. Subclavians: Left subclavian artery flow was disturbed. Normal  flow hemodynamics              were seen in the right subclavian artery. *See table(s) above for measurements and observations.     Preliminary     Scheduled Meds: . amLODipine  5 mg Oral Daily  . aspirin  300 mg Rectal Daily   Or  . aspirin  325 mg Oral Daily  . clopidogrel  75 mg Oral Daily  . enoxaparin (LOVENOX) injection  30 mg Subcutaneous Q24H  . feeding supplement (ENSURE ENLIVE)  237 mL Oral BID BM  . fenofibrate  54 mg Oral Daily  . fluticasone furoate-vilanterol  1 puff Inhalation Daily  . insulin aspart  0-9 Units Subcutaneous TID WC  . losartan  50 mg Oral Daily  . metoprolol tartrate  50 mg Oral BID  . sodium chloride flush  3 mL Intravenous Q12H    Continuous Infusions: . cefTRIAXone (ROCEPHIN)  IV 2 g (08/05/19 [id number])     LOS: 3 days     Cristal Deer, MD [facility]  To reach me or the doctor on call, go to: [email or web address] Password [facility]  08/05/2019, 9:41  AM

## 2019-08-05 NOTE — Progress Notes (Signed)
Physical Therapy Treatment Patient Details Name: Chloe Fletcher MRN: [id number] DOB: May 16, 1941 Today's Date: 08/05/2019    History of Present Illness 79 yo female admitted with hypertensive urgency. MRI + acute R mid frontal cortex. Hx of dementia, DM, CKD, COPD    PT Comments    Pt with improved overall ambulation distance this session, showing good tolerance for activity. Pt lacks safety awareness and requires max verbal cuing for redirection and pt safety. Pt continues to need 24/7 assist upon d/c, if unable to have 24/7 assist may need SNF level of care post-acutely. Pt is high fall risk given unsteadiness in standing and lack of safety awareness.    Follow Up Recommendations  Home health PT;Supervision/Assistance - 24 hour     Equipment Recommendations  None recommended by PT    Recommendations for Other Services       Precautions / Restrictions Precautions Precautions: Fall Restrictions Weight Bearing Restrictions: No    Mobility  Bed Mobility Overal bed mobility: Needs Assistance             General bed mobility comments: pt up in chair upon PT arrival to room, requesting stay in recliner upon PT exit.  Transfers Overall transfer level: Needs assistance Equipment used: 1 person hand held assist Transfers: Sit to/from Stand Sit to Stand: Min assist         General transfer comment: Min assist for steadying upon standing via HHA. Sit to stand x2 from recliner and from toilet.  Ambulation/Gait Ambulation/Gait assistance: Min assist;Min guard Gait Distance (Feet): 120 Feet(20 ft to and from bathroom + 100 ft hallway ambulation) Assistive device: 1 person hand held assist Gait Pattern/deviations: Step-through pattern;Decreased stride length;Shuffle Gait velocity: decr   General Gait Details: Min guard for safety with pt utilizing HHA to self-steady as needed, with occasional min assist for unsteadiness and LOB correction. Pt with posterior LOB x1 during hand  washing task, requiring postural correction from PT.   Stairs             Wheelchair Mobility    Modified Rankin (Stroke Patients Only)       Balance Overall balance assessment: Needs assistance Sitting-balance support: No upper extremity supported Sitting balance-Leahy Scale: Good Sitting balance - Comments: supervision for safety   Standing balance support: Single extremity supported;During functional activity Standing balance-Leahy Scale: Fair Standing balance comment: able to ambulate without AD, requires occasional HHA with posterior LOB noted when pt distracted                            Cognition Arousal/Alertness: Awake/alert Behavior During Therapy: Restless(tangential) Overall Cognitive Status: History of cognitive impairments - at baseline Area of Impairment: Orientation;Attention;Memory;Following commands;Safety/judgement;Awareness;Problem solving                 Orientation Level: Disoriented to;Place;Time;Situation Current Attention Level: Focused Memory: Decreased short-term memory Following Commands: Follows one step commands inconsistently;Follows one step commands with increased time Safety/Judgement: Decreased awareness of safety;Decreased awareness of deficits Awareness: Intellectual Problem Solving: Slow processing;Difficulty sequencing;Requires verbal cues;Requires tactile cues General Comments: Pt tangential, pt's speech has mild "word salad" presentation. Pt difficult to redirect, perseverating on use of cell phone and needing to get to her car. multimodal cuing required for pt safety.      Exercises      General Comments        Pertinent Vitals/Pain Pain Assessment: No/denies pain Faces Pain Scale: No hurt Pain Intervention(s): Limited activity within  patient's tolerance;Monitored during session    Home Living                      Prior Function            PT Goals (current goals can now be found in  the care plan section) Acute Rehab PT Goals Patient Stated Goal: unable PT Goal Formulation: With patient Time For Goal Achievement: 08/16/19 Progress towards PT goals: Progressing toward goals    Frequency    Min 3X/week      PT Plan Current plan remains appropriate    Co-evaluation              AM-PAC PT "6 Clicks" Mobility   Outcome Measure  Help needed turning from your back to your side while in a flat bed without using bedrails?: A Little Help needed moving from lying on your back to sitting on the side of a flat bed without using bedrails?: A Little Help needed moving to and from a bed to a chair (including a wheelchair)?: A Little Help needed standing up from a chair using your arms (e.g., wheelchair or bedside chair)?: A Little Help needed to walk in hospital room?: A Little Help needed climbing 3-5 steps with a railing? : A Little 6 Click Score: 18    End of Session Equipment Utilized During Treatment: Gait belt Activity Tolerance: Patient tolerated treatment well Patient left: with call bell/phone within reach;in chair;with chair alarm set Nurse Communication: Mobility status PT Visit Diagnosis: Unsteadiness on feet (R26.81)     Time: [id number] PT Time Calculation (min) (ACUTE ONLY): 15 min  Charges:  $Gait Training: 8-22 mins                     Chloe Fletcher E, PT Acute Rehabilitation Services Pager [phone]  Office [phone]    Chloe Fletcher D Chloe Fletcher 08/05/2019, 11:02 AM

## 2019-08-06 ENCOUNTER — Other Ambulatory Visit (HOSPITAL_COMMUNITY): Payer: Medicare Other

## 2019-08-06 LAB — COMPREHENSIVE METABOLIC PANEL
ALT: 14 U/L (ref 0–44)
AST: 13 U/L — ABNORMAL LOW (ref 15–41)
Albumin: 2.8 g/dL — ABNORMAL LOW (ref 3.5–5.0)
Alkaline Phosphatase: 44 U/L (ref 38–126)
Anion gap: 7 (ref 5–15)
BUN: 29 mg/dL — ABNORMAL HIGH (ref 8–23)
CO2: 27 mmol/L (ref 22–32)
Calcium: 9.3 mg/dL (ref 8.9–10.3)
Chloride: 101 mmol/L (ref 98–111)
Creatinine, Ser: 1.59 mg/dL — ABNORMAL HIGH (ref 0.44–1.00)
GFR calc Af Amer: 36 mL/min — ABNORMAL LOW (ref >60)
GFR calc non Af Amer: 31 mL/min — ABNORMAL LOW (ref >60)
Glucose, Bld: 120 mg/dL — ABNORMAL HIGH (ref 70–99)
Potassium: 4 mmol/L (ref 3.5–5.1)
Sodium: 135 mmol/L (ref 135–145)
Total Bilirubin: 0.6 mg/dL (ref 0.3–1.2)
Total Protein: 6 g/dL — ABNORMAL LOW (ref 6.5–8.1)

## 2019-08-06 LAB — GLUCOSE, CAPILLARY
Glucose-Capillary: 117 mg/dL — ABNORMAL HIGH (ref 70–99)
Glucose-Capillary: 123 mg/dL — ABNORMAL HIGH (ref 70–99)
Glucose-Capillary: 140 mg/dL — ABNORMAL HIGH (ref 70–99)
Glucose-Capillary: 91 mg/dL (ref 70–99)

## 2019-08-06 NOTE — Progress Notes (Signed)
PROGRESS NOTE  Casilda Dancey [id number] DOB: July 30, 1940 DOA: 08/01/2019 PCP: Dulce Sellar, MD  HPI/Recap of past 24 hours: Per HPI on admission:Minal Wrennis a 79 y.o.femalewith medical history significant fordementia, hypertension, type 2 diabetes mellitus, stage III chronic kidney disease, chronic tobacco abuse, COPD,who is admitted to [facility] on 2/3/2021with hypertensive crisisafter [facility] from [facility] Departmentfor [facility] of elevated blood pressure.  Blood pressure has finally settled down with resumption of her blood pressure medications.  She continues to remain confused, found to have a UTI,  started on antibiotics.  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  Neurology is following.  August 05, 2019. Subjective: Patient seen and examined at bedside continues to be confused nurse reported patient was pulling on her IV lines.  August 06, 2019 Subjective: Patient seen and examined at bedside she is lying down in bed not as confused as yesterday neurological work-up is still ongoing Doppler study done yesterday seem to be normal.     Assessment/Plan: Principal Problem:   Hypertensive crisis Active Problems:   Acute cystitis   Diabetes mellitus without complication (HCC)   CKD (chronic kidney disease) stage 3, GFR 30-59 ml/min   COPD (chronic obstructive pulmonary disease) (HCC)   Hypertensive urgency  1.  Hypertensive urgency improved  2.  Acute stroke. Patient presented with worsening confusion with poor cognition and no focal deficit MRI of the head showed 3 mm acute infarct in the right mid frontal cortex neurology suggested full work-up.  TEE  and MRI brain ordered Carotid Doppler done yesterday seem to be normal.  Neurology is following  3.  Urinary  tract infection continue ceftriaxone We will follow up with urine cultures  4.  Dementia with confusion.  Notes that son stated that her cognition has significantly declined in the past 1 month.  Neurology following  5.  Chronic kidney disease stage III she is at baseline  6.  COPD patient is not in exacerbation continue home albuterol and elliptica  7.  Type 2 diabetes mellitus with holding her home oral medication continue sliding scale insulin  8.  Nonobstructing left renal stone nonsymptomatic.  She will follow up with urology as outpatient  9.  Mild hypokalemia of 3.4.  I will replace with 20 mEq x 1.  We will recheck in the morning    Severity of Illness: The appropriate patient status for this patient is INPATIENT. Inpatient status is judged to be reasonable and necessary in order to provide the required intensity of service to ensure the patient's safety. The patient's presenting symptoms, physical exam findings, and initial radiographic and laboratory data in the context of their chronic comorbidities is felt to place them at high risk for further clinical deterioration. Furthermore, it is not anticipated that the patient will be medically stable for discharge from the hospital within 2 midnights of admission. The following factors support the patient status of inpatient.   " The patient's presenting symptoms include hypertensive emergency. " The worrisome physical exam findings include elevated blood pressure confusion. " The initial radiographic and laboratory data are worrisome because of urinary tract infection. " The chronic co-morbidities include dementia.   * I certify that at the point of admission it is my clinical judgment that the patient will require inpatient hospital care spanning beyond 2 midnights from the point of  admission due to high intensity of service, high risk for further deterioration and high frequency of surveillance required.*    DVT prophylaxis:    Code Status: Full code Family Communication: Discussed with son.brad over the phone in detail.he said that she had a 77% blockage before.  But the Doppler study that was done today shows only 1 to 39.0% blockage on right and the left carotid carotid Disposition Plan: Anticipated discharge to assisted living facility pending medical clearance.  Patient is from [place] home Barriers: Continued medical work-up for confusion  Consultants:   Neurology  Procedures:  None Antimicrobials:  Ceftriaxone   Objective: Vitals:   08/06/19 0812 08/06/19 1206 08/06/19 1458 08/06/19 1535  BP:  (!) 150/46 (!) 170/60 (!) 155/75  Pulse:   73 91  Resp:   18   Temp:   97.6 F (36.4 C)   TempSrc:   Oral   SpO2: 97%  99% 97%  Weight:      Height:        Intake/Output Summary (Last 24 hours) at 08/06/2019 1702 Last data filed at 08/06/2019 0600 Gross per 24 hour  Intake 240.56 ml  Output --  Net 240.56 ml   Filed Weights   08/01/19 1426 08/02/19 0500 08/05/19 0500  Weight: 54.4 kg 55.8 kg 53.8 kg   Body mass index is 21.69 kg/m.  Exam:  . General: 79 y.o. year-old female well developed well nourished in no acute distress.  Alert and oriented x3. . Cardiovascular: Regular rate and rhythm with no rubs or gallops.  No thyromegaly or JVD noted.   Marland Kitchen Respiratory: Clear to auscultation with no wheezes or rales. Good inspiratory effort. . Abdomen: Soft nontender nondistended with normal bowel sounds x4 quadrants. . Musculoskeletal: No lower extremity edema. 2/4 pulses in all 4 extremities. . Skin: No ulcerative lesions noted or rashes, . Psychiatry: Mood is appropriate for condition and setting    Data Reviewed: CBC: Recent Labs  Lab 08/01/19 1508 08/02/19 0516 08/03/19 0507 08/04/19 0415 08/05/19 0454  WBC 7.7 8.0 6.3 5.7 7.5  HGB 11.2* 10.9* 10.2* 10.2* 11.3*  HCT 33.9* 32.5* 31.0* 30.9* 34.1*  MCV 97.4 95.6 97.2 97.8 96.3  PLT 354 307 321 329 [id number]   Basic Metabolic  Panel: Recent Labs  Lab 08/02/19 0516 08/03/19 0507 08/04/19 0415 08/05/19 0454 08/06/19 0527  NA 133* 135 137 136 135  K 3.3* 3.9 3.9 3.4* 4.0  CL 100 104 105 97* 101  CO2 23 22 23 23 27   GLUCOSE 134* 114* 109* 132* 120*  BUN 20 26* 29* 23 29*  CREATININE 1.47* 1.60* 1.68* 1.34* 1.59*  CALCIUM 9.1 9.1 8.9 9.0 9.3  MG 1.8 2.1 2.0 2.1  --   PHOS  --  4.6 4.4 4.1  --    GFR: Estimated Creatinine Clearance: 23.1 mL/min (A) (by C-G formula based on SCr of 1.59 mg/dL (H)). Liver Function Tests: Recent Labs  Lab 08/01/19 1508 08/02/19 0516 08/03/19 0507 08/06/19 0527  AST 17 16 14* 13*  ALT 13 13 13 14   ALKPHOS 49 47 42 44  BILITOT 0.9 0.5 0.5 0.6  PROT 6.5 5.9* 5.6* 6.0*  ALBUMIN 3.4* 3.1* 2.7* 2.8*   Recent Labs  Lab 08/01/19 1508  LIPASE 36   No results for input(s): AMMONIA in the last 168 hours. Coagulation Profile: No results for input(s): INR, PROTIME in the last 168 hours. Cardiac Enzymes: No results for input(s): CKTOTAL, CKMB, CKMBINDEX, TROPONINI in the last 168 hours.  BNP (last 3 results) No results for input(s): PROBNP in the last 8760 hours. HbA1C: Recent Labs    08/05/19 0454  HGBA1C 5.7*   CBG: Recent Labs  Lab 08/05/19 1612 08/05/19 2113 08/06/19 0757 08/06/19 1202 08/06/19 1642  GLUCAP 96 102* 117* 123* 140*   Lipid Profile: Recent Labs    08/05/19 0454  CHOL 241*  HDL 48  LDLCALC 156*  TRIG 184*  CHOLHDL 5.0   Thyroid Function Tests: No results for input(s): TSH, T4TOTAL, FREET4, T3FREE, THYROIDAB in the last 72 hours. Anemia Panel: No results for input(s): VITAMINB12, FOLATE, FERRITIN, TIBC, IRON, RETICCTPCT in the last 72 hours. Urine analysis:    Component Value Date/Time   COLORURINE YELLOW 08/01/2019 2007   APPEARANCEUR CLEAR 08/01/2019 2007   LABSPEC 1.030 08/01/2019 2007   PHURINE 6.0 08/01/2019 2007   GLUCOSEU NEGATIVE 08/01/2019 2007   HGBUR NEGATIVE 08/01/2019 2007   BILIRUBINUR NEGATIVE 08/01/2019 2007    [place] NEGATIVE 08/01/2019 2007   PROTEINUR >=300 (A) 08/01/2019 2007   NITRITE NEGATIVE 08/01/2019 2007   LEUKOCYTESUR MODERATE (A) 08/01/2019 2007   Sepsis Labs: @LABRCNTIP (procalcitonin:4,lacticidven:4)  ) Recent Results (from the past 240 hour(s))  Culture, Urine     Status: Abnormal   Collection Time: 08/01/19  8:07 PM   Specimen: Urine, Clean Catch  Result Value Ref Range Status   Specimen Description   Final    URINE, CLEAN CATCH Performed at [facility], [place] [address], [place], [place] [address]    Special Requests   Final    NONE Performed at [facility], [place] [address], [place], [place] [address]    Culture (A)  Final    <10,000 COLONIES/mL INSIGNIFICANT GROWTH Performed at [place] [address], [place], [place] [address]    Report Status 08/03/2019 FINAL  Final  Respiratory Panel by RT PCR (Flu A&B, Covid) - Nasopharyngeal Swab     Status: None   Collection Time: 08/02/19 12:26 AM   Specimen: Nasopharyngeal Swab  Result Value Ref Range Status   SARS Coronavirus 2 by RT PCR NEGATIVE NEGATIVE Final    Comment: (NOTE) SARS-CoV-2 target nucleic acids are NOT DETECTED. The SARS-CoV-2 RNA is generally detectable in upper respiratoy specimens during the acute phase of infection. The lowest concentration of SARS-CoV-2 viral copies this assay can detect is 131 copies/mL. A negative result does not preclude SARS-Cov-2 infection and should not be used as the sole basis for treatment or other patient management decisions. A negative result may occur with  improper specimen collection/handling, submission of specimen other than nasopharyngeal swab, presence of viral mutation(s) within the areas targeted by this assay, and inadequate number of viral copies (<131 copies/mL). A negative result must be combined with clinical observations, patient history, and epidemiological information. The expected result is  Negative. Fact Sheet for Patients:  PinkCheek.be Fact Sheet for Healthcare Providers:  GravelBags.it This test is not yet ap proved or cleared by the [place] FDA and  has been authorized for detection and/or diagnosis of SARS-CoV-2 by FDA under an Emergency Use Authorization (EUA). This EUA will remain  in effect (meaning this test can be used) for the duration of the COVID-19 declaration under Section 564(b)(1) of the Act, 21 U.S.C. section 360bbb-3(b)(1), unless the authorization is terminated or revoked sooner.    Influenza A by PCR NEGATIVE NEGATIVE Final   Influenza B by PCR NEGATIVE NEGATIVE Final    Comment: (NOTE) The Xpert Xpress SARS-CoV-2/FLU/RSV assay  is intended as an aid in  the diagnosis of influenza from Nasopharyngeal swab specimens and  should not be used as a sole basis for treatment. Nasal washings and  aspirates are unacceptable for Xpert Xpress SARS-CoV-2/FLU/RSV  testing. Fact Sheet for Patients: PinkCheek.be Fact Sheet for Healthcare Providers: GravelBags.it This test is not yet approved or cleared by the [place] FDA and  has been authorized for detection and/or diagnosis of SARS-CoV-2 by  FDA under an Emergency Use Authorization (EUA). This EUA will remain  in effect (meaning this test can be used) for the duration of the  Covid-19 declaration under Section 564(b)(1) of the Act, 21  U.S.C. section 360bbb-3(b)(1), unless the authorization is  terminated or revoked. Performed at [facility], [place] [address], [place], [place] [address]   Culture, blood (Routine X 2) w Reflex to ID Panel     Status: None (Preliminary result)   Collection Time: 08/02/19  1:40 AM   Specimen: BLOOD  Result Value Ref Range Status   Specimen Description   Final    BLOOD LEFT ANTECUBITAL Performed at [place] [address], [place], [place] [address]    Special Requests   Final    BOTTLES DRAWN AEROBIC AND ANAEROBIC Blood Culture adequate volume Performed at [place] [address] [place], [place] [address]    Culture   Final    NO GROWTH 4 DAYS Performed at [facility], [place] [address] [place], [place] [address]    Report Status PENDING  Incomplete  Culture, blood (Routine X 2) w Reflex to ID Panel     Status: None (Preliminary result)   Collection Time: 08/02/19  1:45 AM   Specimen: BLOOD  Result Value Ref Range Status   Specimen Description   Final    BLOOD LEFT ANTECUBITAL Performed at [place] [address] [place], [place] [address]    Special Requests   Final    BOTTLES DRAWN AEROBIC AND ANAEROBIC Blood Culture adequate volume Performed at [place] [address] [place], [place] [address]    Culture   Final    NO GROWTH 4 DAYS Performed at [facility], [place] [address] [place],  [address]    Report Status PENDING  Incomplete  MRSA PCR Screening     Status: None   Collection Time: 08/03/19  5:35 PM   Specimen: Nasal Mucosa; Nasopharyngeal  Result Value Ref Range Status   MRSA by PCR NEGATIVE NEGATIVE Final    Comment:        The GeneXpert MRSA Assay (FDA approved for NASAL specimens only), is one component of a comprehensive MRSA colonization surveillance program. It is not intended to diagnose MRSA infection nor to guide or monitor treatment for MRSA infections. Performed at [facility], [place] [address] [place],  [address]       Studies: No results found.  Scheduled Meds: . amLODipine  5 mg Oral Daily  . aspirin  300 mg Rectal Daily   Or  . aspirin  325 mg Oral Daily  . clopidogrel  75 mg Oral Daily  . enoxaparin (LOVENOX) injection  30 mg Subcutaneous Q24H  . feeding supplement (ENSURE ENLIVE)  237 mL Oral BID BM  . fenofibrate   54 mg Oral Daily  . fluticasone furoate-vilanterol  1 puff Inhalation Daily  . insulin aspart  0-9 Units Subcutaneous TID WC  . losartan  50 mg Oral Daily  . metoprolol tartrate  50 mg Oral BID  . sodium chloride flush  3 mL Intravenous Q12H    Continuous Infusions: . cefTRIAXone (ROCEPHIN)  IV 2 g (08/06/19 0535)     LOS: 4 days     Cristal Deer, MD [facility]  To reach me or the doctor on call, go to: [email or web address] Password [email or web address]  08/06/2019, 5:02 PM

## 2019-08-07 ENCOUNTER — Inpatient Hospital Stay (HOSPITAL_COMMUNITY): Payer: Medicare Other

## 2019-08-07 LAB — BASIC METABOLIC PANEL
Anion gap: 8 (ref 5–15)
BUN: 26 mg/dL — ABNORMAL HIGH (ref 8–23)
CO2: 25 mmol/L (ref 22–32)
Calcium: 9 mg/dL (ref 8.9–10.3)
Chloride: 101 mmol/L (ref 98–111)
Creatinine, Ser: 1.38 mg/dL — ABNORMAL HIGH (ref 0.44–1.00)
GFR calc Af Amer: 42 mL/min — ABNORMAL LOW (ref >60)
GFR calc non Af Amer: 37 mL/min — ABNORMAL LOW (ref >60)
Glucose, Bld: 128 mg/dL — ABNORMAL HIGH (ref 70–99)
Potassium: 4 mmol/L (ref 3.5–5.1)
Sodium: 134 mmol/L — ABNORMAL LOW (ref 135–145)

## 2019-08-07 LAB — CBC
HCT: 30.3 % — ABNORMAL LOW (ref 36.0–46.0)
Hemoglobin: 9.9 g/dL — ABNORMAL LOW (ref 12.0–15.0)
MCH: 31.7 pg (ref 26.0–34.0)
MCHC: 32.7 g/dL (ref 30.0–36.0)
MCV: 97.1 fL (ref 80.0–100.0)
Platelets: 304 10*3/uL (ref 150–400)
RBC: 3.12 MIL/uL — ABNORMAL LOW (ref 3.87–5.11)
RDW: 12.9 % (ref 11.5–15.5)
WBC: 7.6 10*3/uL (ref 4.0–10.5)
nRBC: 0 % (ref 0.0–0.2)

## 2019-08-07 LAB — GLUCOSE, CAPILLARY
Glucose-Capillary: 103 mg/dL — ABNORMAL HIGH (ref 70–99)
Glucose-Capillary: 140 mg/dL — ABNORMAL HIGH (ref 70–99)
Glucose-Capillary: 96 mg/dL (ref 70–99)
Glucose-Capillary: 100 mg/dL — ABNORMAL HIGH (ref 70–99)

## 2019-08-07 IMAGING — MR MR MRA HEAD W/O CM
1 series · 19 of 48 positions shown · non-contrast
Comparison: Brain MRI [DATE], head CT [DATE]

CLINICAL DATA: Stroke/TIA, assess extracranial arteries.

EXAM:
MRA HEAD WITHOUT CONTRAST
TECHNIQUE: Angiographic images of the Circle of Willis were obtained using MRA
technique without intravenous contrast.

[Series 3: ***fast (id) mt · axial · 1.4mm · 0.39mm/px · z∈[-31,+75]mm · 19 of 159 slices shown]
[im 1/159]
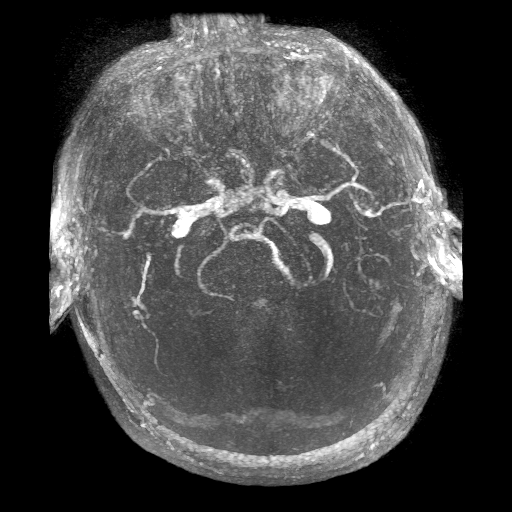
[im 4/159]
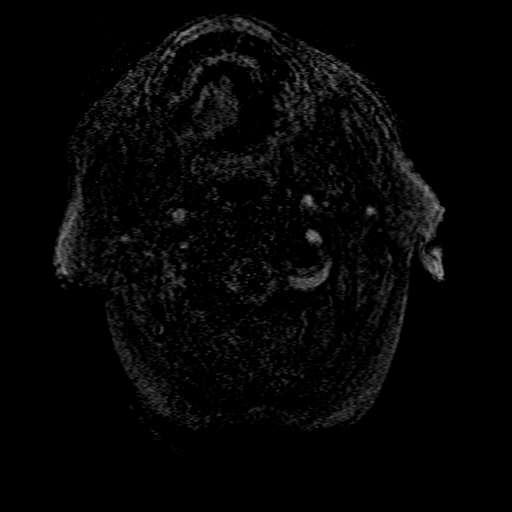
[im 7/159]
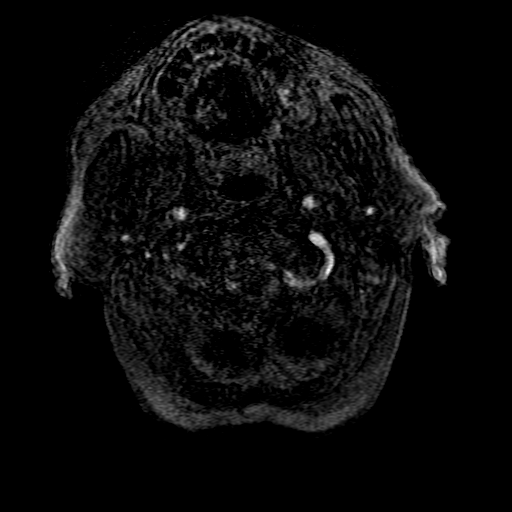
[im 11/159]
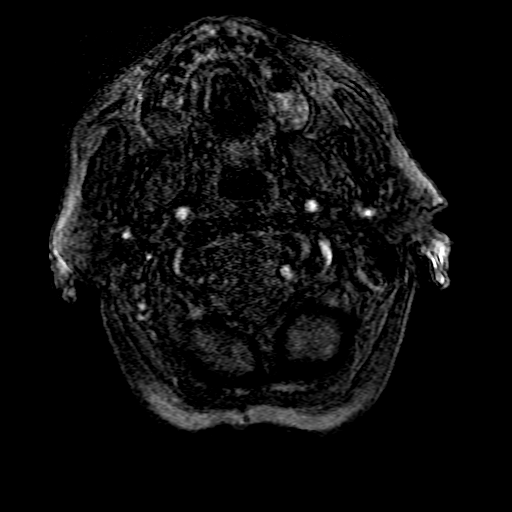
[im 14/159]
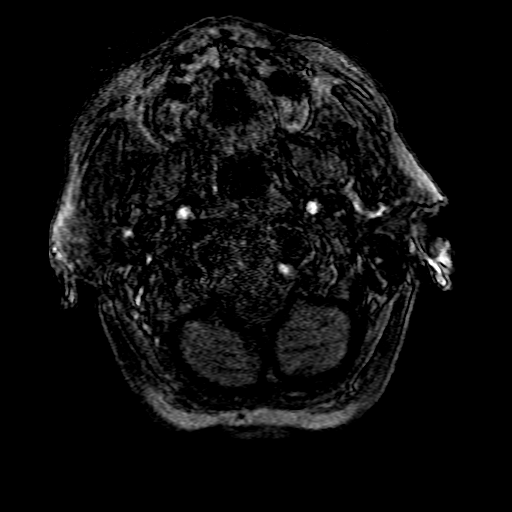
[im 17/159]
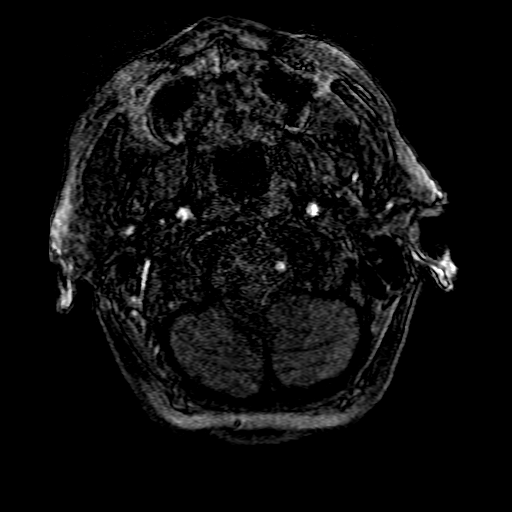
[im 21/159]
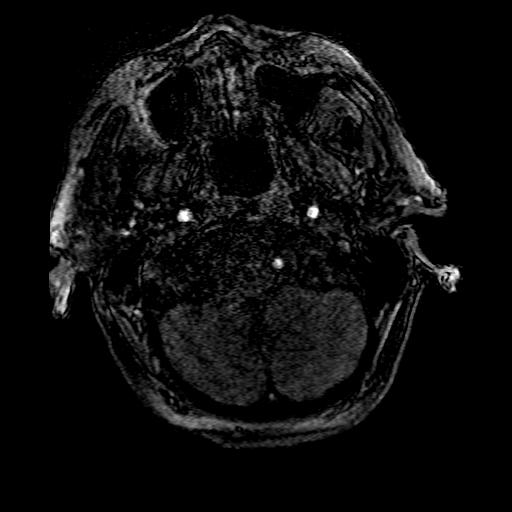
[im 24/159]
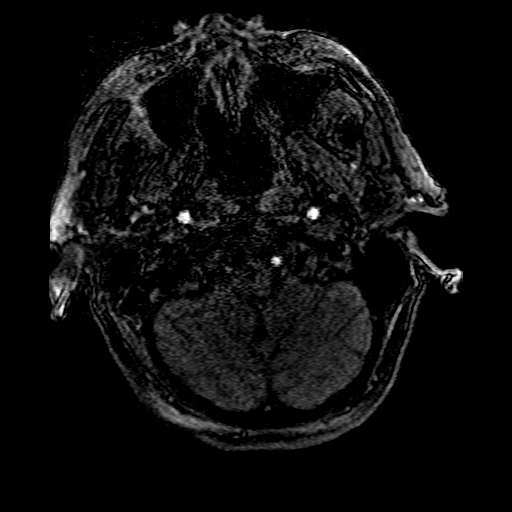
[im 27/159]
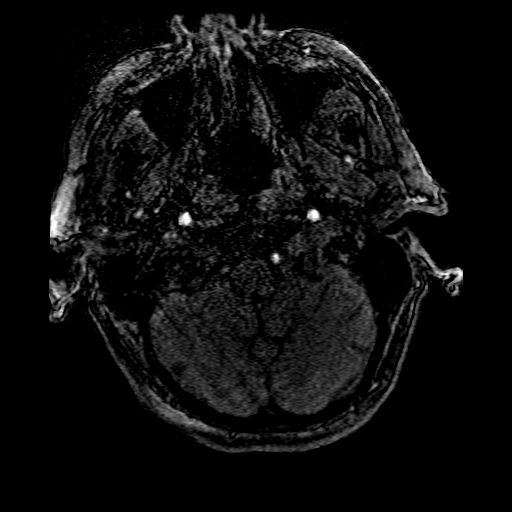
[im 31/159]
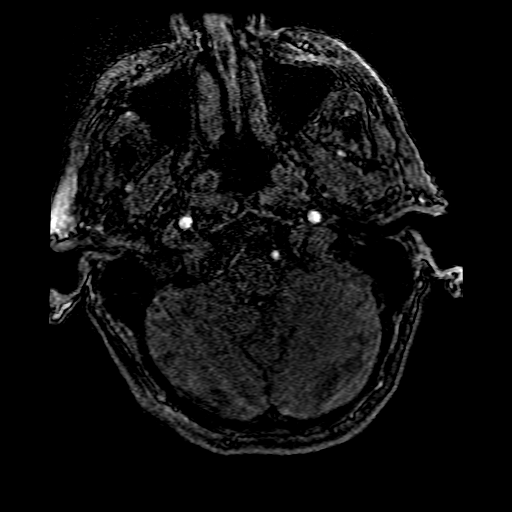
[im 34/159]
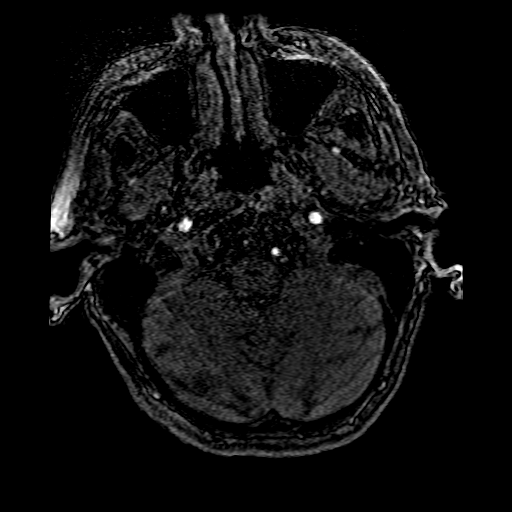
[im 51/159]
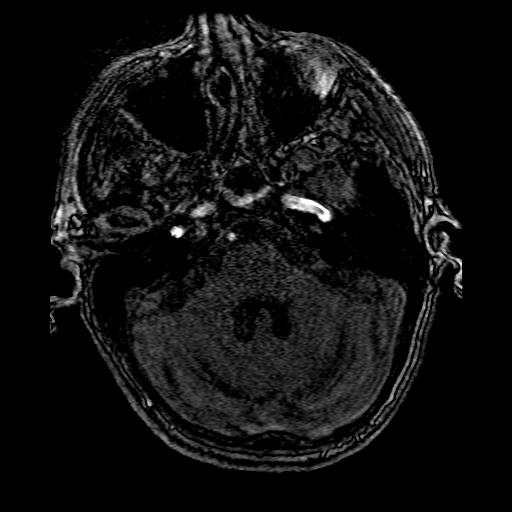
[im 71/159]
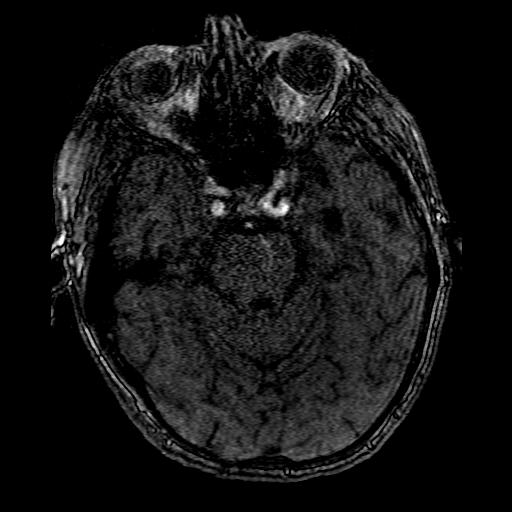
[im 81/159]
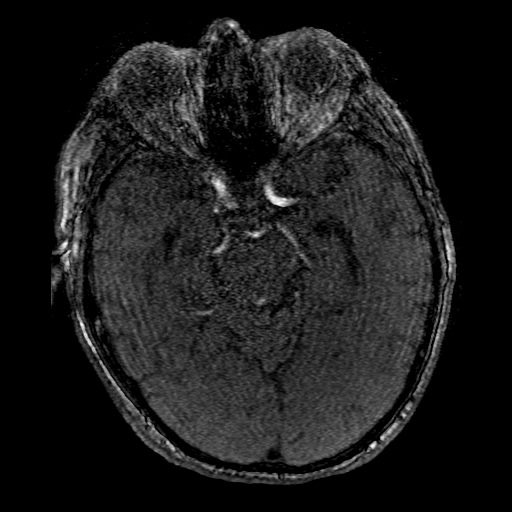
[im 91/159]
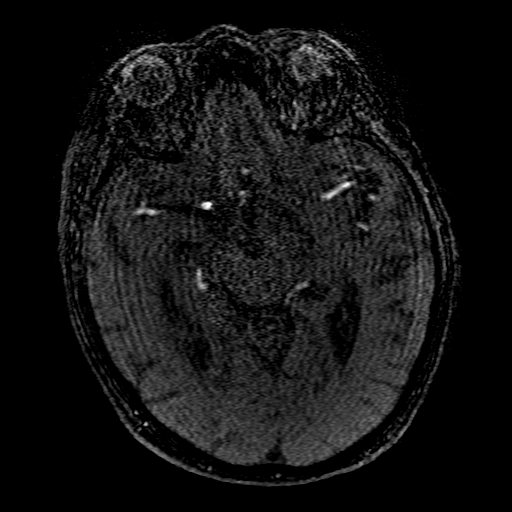
[im 111/159]
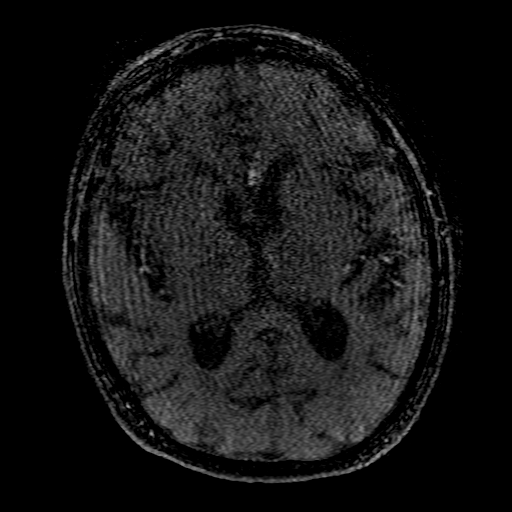
[im 132/159]
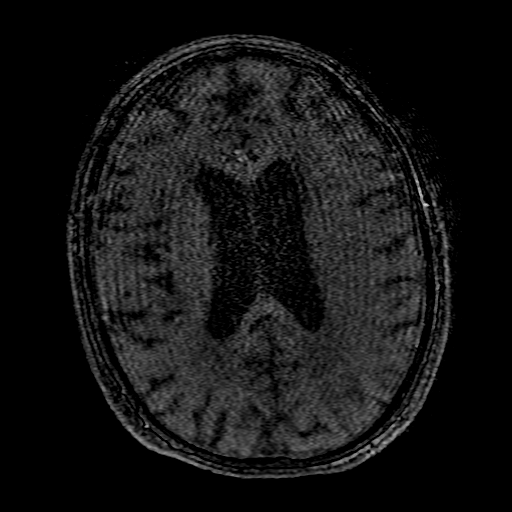
[im 135/159]
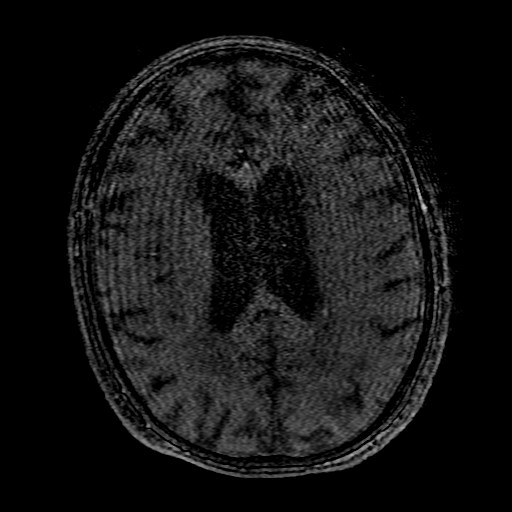
[im 152/159]
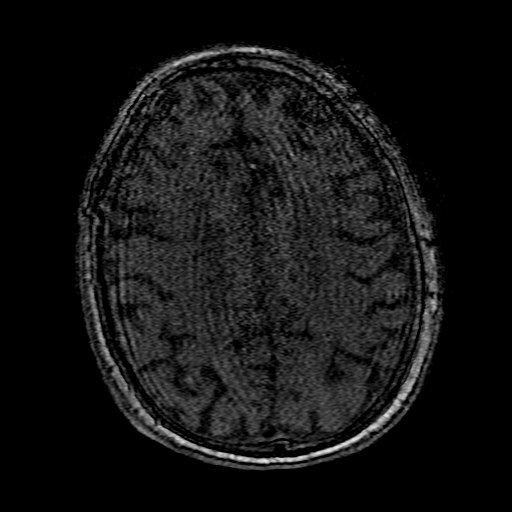

[19 of 48 positions shown; findings below may reference images not displayed]

FINDINGS: The examination is moderately motion degraded, limiting evaluation.
The intracranial internal carotid arteries are patent without
high-grade stenosis. There is aneurysmal dilatation of the cavernous
right internal carotid artery which is somewhat poorly assessed due
to the degree of motion degradation. This aneurysm measures up to 8
mm in diameter and is fusiform with suspected saccular components
(Series 3, image 97).

The M1 middle cerebral arteries are patent without significant
stenosis. The M2 and more distal MCA vessels are poorly assessed on
the current study.

The anterior cerebral arteries are patent without appreciable
high-grade proximal stenosis.

Portions of the intracranial right vertebral artery are poorly
delineated. This may be due to developmentally small vessel size and
motion degradation. High-grade stenosis cannot be excluded. The
intracranial left vertebral artery is patent without significant
stenosis. The basilar artery is patent without high-grade stenosis.

Predominantly fetal origin of the right posterior cerebral artery.
The posterior cerebral arteries are patent proximally, but otherwise
poorly assessed due to the degree of motion degradation.
IMPRESSION: Significantly motion degraded and limited examination as described.

Aneurysmal dilatation of the cavernous right internal carotid artery
which is fusiform with suspected saccular components. Given the
motion degradation on the current exam, consider CTA for further
characterization when clinically appropriate.

Portions of the intracranial right vertebral artery are poorly
delineated. This may be due to small vessel size and motion
degradation. High-grade stenosis cannot be excluded.

Within described limitations, no definite proximal branch occlusion
or high-grade proximal arterial stenosis identified elsewhere.

## 2019-08-07 MED ORDER — CYANOCOBALAMIN 1000 MCG/ML IJ SOLN
1000.0000 ug | Freq: Every day | INTRAMUSCULAR | Status: AC
  Administered 2019-08-07 – 2019-08-09 (×3): 1000 ug via INTRAMUSCULAR
  Filled 2019-08-07 (×3): qty 1

## 2019-08-07 MED ORDER — ATORVASTATIN CALCIUM 40 MG PO TABS
40.0000 mg | ORAL_TABLET | Freq: Every day | ORAL | Status: DC
  Administered 2019-08-08 – 2019-08-09 (×2): 40 mg via ORAL
  Filled 2019-08-07 (×2): qty 1

## 2019-08-07 MED ORDER — LACTATED RINGERS IV SOLN: INTRAVENOUS | Status: DC

## 2019-08-07 MED ORDER — AMLODIPINE BESYLATE 10 MG PO TABS
10.0000 mg | ORAL_TABLET | Freq: Every day | ORAL | Status: DC
  Administered 2019-08-08 – 2019-08-09 (×2): 10 mg via ORAL
  Filled 2019-08-07 (×2): qty 1

## 2019-08-07 MED ORDER — LORAZEPAM 2 MG/ML IJ SOLN
0.5000 mg | Freq: Once | INTRAMUSCULAR | Status: AC
  Administered 2019-08-07: 0.5 mg via INTRAVENOUS
  Filled 2019-08-07: qty 1

## 2019-08-07 NOTE — Progress Notes (Addendum)
PROGRESS NOTE    Chloe Fletcher  [id number] DOB: Jan 19, 1941 DOA: 08/01/2019 PCP: Dulce Sellar, MD   Brief Narrative: 79 year old with past medical history significant for dementia, hypertension, type 2 diabetes, stage III chronic kidney disease, chronic tobacco abuse, COPD who was admitted to [facility] long hospital on 05/31/2020 with hypertensive crisis after presenting from skilled nursing facility for elevation of high blood pressure.  Patient blood pressure improved with resumption of medication.  Patient continued to remain confused, she was treated for urinary tract infection with 5 days of ceftriaxone.  CT head was unremarkable.  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  Stroke work-up in process.    Assessment & Plan:   Principal Problem:   Hypertensive crisis Active Problems:   Acute cystitis   Diabetes mellitus without complication (HCC)   CKD (chronic kidney disease) stage 3, GFR 30-59 ml/min   COPD (chronic obstructive pulmonary disease) (HCC)   Hypertensive urgency  1-Hypertensive urgency: Continue with Norvasc, Cozaar and metoprolol. will  increase Norvasc to 10 mg.  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 Neurology recommended a full work-up for stroke. Plan to continue with Plavix if not a structural abnormality predisposing for stroke is seen. Currently on aspirin. LDL elevated at weeks.  Will Lipitor 40 mg daily. A1c 5.7. Awaiting MRA.  Will give 0.5 IV of Ativan to proceed with the test. Echo; normal ejection fraction, diastolic dysfunction grade 2, no source for embolism Carotid Doppler: left and  right ICA less than 39% plaque  3-UTI; Received 5 days of IV ceftriaxone. Stopped 2-08 Urine culture; insignificant growth.   4-Dementia; with confusion;  B 12 low normal start IM B12 supplementation.   5-CKD stage III  b; Cr stable at 1.3 Monitor   6-COPD; continue with Elliptica  Diabetes type 2: Hold oral medication while inpatient.  Continue with sliding scale insulin.  None obstructing left renal stone: Needs to follow-up with urology as an outpatient Hypokalemia: Resolved   Estimated body mass index is 21.69 kg/m as calculated from the following:   Height as of this encounter: 5\' 2"  (1.575 m).   Weight as of this encounter: 53.8 kg.   DVT prophylaxis:  Code Status: Full code Family Communication: No family at bedside Disposition Plan:  Patient is from: [place] facility Anticipated d/c date: 1 or 2 days Barriers to d/c or necessity for inpatient status: Awaiting MRA and further control of blood pressure  Consultants:   Neurology  Procedures:  ECHO Carotid doppler  Antimicrobials:  Received ceftriaxone for 5 days  Subjective: She is alert, she wants to continue to sleep.  She is following commands.  Objective: Vitals:   08/06/19 1458 08/06/19 1535 08/06/19 2019 08/07/19 0542  BP: (!) 170/60 (!) 155/75 (!) 184/63 (!) 167/62  Pulse: 73 91 89 100  Resp: 18  17 18   Temp: 97.6 F (36.4 C)  98.6 F (37 C) 98.6 F (37 C)  TempSrc: Oral  Oral Oral  SpO2: 99% 97% 96% 98%  Weight:      Height:        Intake/Output Summary (Last 24 hours) at 08/07/2019 0823 Last data filed at 08/06/2019 2100 Gross per 24 hour  Intake --  Output 200 ml  Net -200 ml   Filed Weights   08/01/19 1426 08/02/19 0500 08/05/19 0500  Weight: 54.4 kg 55.8 kg 53.8  kg    Examination:  General exam: Appears calm and comfortable  Respiratory system: Clear to auscultation. Respiratory effort normal. Cardiovascular system: S1 & S2 heard, RRR. No JVD, murmurs, rubs, gallops or clicks. No pedal edema. Gastrointestinal system: Abdomen is nondistended, soft and nontender. No organomegaly or masses felt. Normal bowel sounds heard. Central nervous system: Alert follows commands Extremities: Symmetric 5 x 5  power. Skin: No rashes, lesions or ulcers    Data Reviewed: I have personally reviewed following labs and imaging studies  CBC: Recent Labs  Lab 08/01/19 1508 08/02/19 0516 08/03/19 0507 08/04/19 0415 08/05/19 0454  WBC 7.7 8.0 6.3 5.7 7.5  HGB 11.2* 10.9* 10.2* 10.2* 11.3*  HCT 33.9* 32.5* 31.0* 30.9* 34.1*  MCV 97.4 95.6 97.2 97.8 96.3  PLT 354 307 321 329 [id number]   Basic Metabolic Panel: Recent Labs  Lab 08/02/19 0516 08/03/19 0507 08/04/19 0415 08/05/19 0454 08/06/19 0527  NA 133* 135 137 136 135  K 3.3* 3.9 3.9 3.4* 4.0  CL 100 104 105 97* 101  CO2 23 22 23 23 27   GLUCOSE 134* 114* 109* 132* 120*  BUN 20 26* 29* 23 29*  CREATININE 1.47* 1.60* 1.68* 1.34* 1.59*  CALCIUM 9.1 9.1 8.9 9.0 9.3  MG 1.8 2.1 2.0 2.1  --   PHOS  --  4.6 4.4 4.1  --    GFR: Estimated Creatinine Clearance: 23.1 mL/min (A) (by C-G formula based on SCr of 1.59 mg/dL (H)). Liver Function Tests: Recent Labs  Lab 08/01/19 1508 08/02/19 0516 08/03/19 0507 08/06/19 0527  AST 17 16 14* 13*  ALT 13 13 13 14   ALKPHOS 49 47 42 44  BILITOT 0.9 0.5 0.5 0.6  PROT 6.5 5.9* 5.6* 6.0*  ALBUMIN 3.4* 3.1* 2.7* 2.8*   Recent Labs  Lab 08/01/19 1508  LIPASE 36   No results for input(s): AMMONIA in the last 168 hours. Coagulation Profile: No results for input(s): INR, PROTIME in the last 168 hours. Cardiac Enzymes: No results for input(s): CKTOTAL, CKMB, CKMBINDEX, TROPONINI in the last 168 hours. BNP (last 3 results) No results for input(s): PROBNP in the last 8760 hours. HbA1C: Recent Labs    08/05/19 0454  HGBA1C 5.7*   CBG: Recent Labs  Lab 08/06/19 0757 08/06/19 1202 08/06/19 1642 08/06/19 2018 08/07/19 0742  GLUCAP 117* 123* 140* 91 103*   Lipid Profile: Recent Labs    08/05/19 0454  CHOL 241*  HDL 48  LDLCALC 156*  TRIG 184*  CHOLHDL 5.0   Thyroid Function Tests: No results for input(s): TSH, T4TOTAL, FREET4, T3FREE, THYROIDAB in the last 72 hours. Anemia  Panel: No results for input(s): VITAMINB12, FOLATE, FERRITIN, TIBC, IRON, RETICCTPCT in the last 72 hours. Sepsis Labs: No results for input(s): PROCALCITON, LATICACIDVEN in the last 168 hours.  Recent Results (from the past 240 hour(s))  Culture, Urine     Status: Abnormal   Collection Time: 08/01/19  8:07 PM   Specimen: Urine, Clean Catch  Result Value Ref Range Status   Specimen Description   Final    URINE, CLEAN CATCH Performed at [facility], [place] [address], [place], [place] [address]    Special Requests   Final    NONE Performed at [facility], [place] [address] [place], [place] [address]    Culture (A)  Final    <10,000 COLONIES/mL INSIGNIFICANT GROWTH Performed at [place] [address], [place], [place] [address]    Report Status  08/03/2019 FINAL  Final  Respiratory Panel by RT PCR (Flu A&B, Covid) - Nasopharyngeal Swab     Status: None   Collection Time: 08/02/19 12:26 AM   Specimen: Nasopharyngeal Swab  Result Value Ref Range Status   SARS Coronavirus 2 by RT PCR NEGATIVE NEGATIVE Final    Comment: (NOTE) SARS-CoV-2 target nucleic acids are NOT DETECTED. The SARS-CoV-2 RNA is generally detectable in upper respiratoy specimens during the acute phase of infection. The lowest concentration of SARS-CoV-2 viral copies this assay can detect is 131 copies/mL. A negative result does not preclude SARS-Cov-2 infection and should not be used as the sole basis for treatment or other patient management decisions. A negative result may occur with  improper specimen collection/handling, submission of specimen other than nasopharyngeal swab, presence of viral mutation(s) within the areas targeted by this assay, and inadequate number of viral copies (<131 copies/mL). A negative result must be combined with clinical observations, patient history, and epidemiological information. The expected result is Negative. Fact Sheet for  Patients:  PinkCheek.be Fact Sheet for Healthcare Providers:  GravelBags.it This test is not yet ap proved or cleared by the [place] FDA and  has been authorized for detection and/or diagnosis of SARS-CoV-2 by FDA under an Emergency Use Authorization (EUA). This EUA will remain  in effect (meaning this test can be used) for the duration of the COVID-19 declaration under Section 564(b)(1) of the Act, 21 U.S.C. section 360bbb-3(b)(1), unless the authorization is terminated or revoked sooner.    Influenza A by PCR NEGATIVE NEGATIVE Final   Influenza B by PCR NEGATIVE NEGATIVE Final    Comment: (NOTE) The Xpert Xpress SARS-CoV-2/FLU/RSV assay is intended as an aid in  the diagnosis of influenza from Nasopharyngeal swab specimens and  should not be used as a sole basis for treatment. Nasal washings and  aspirates are unacceptable for Xpert Xpress SARS-CoV-2/FLU/RSV  testing. Fact Sheet for Patients: PinkCheek.be Fact Sheet for Healthcare Providers: GravelBags.it This test is not yet approved or cleared by the [place] FDA and  has been authorized for detection and/or diagnosis of SARS-CoV-2 by  FDA under an Emergency Use Authorization (EUA). This EUA will remain  in effect (meaning this test can be used) for the duration of the  Covid-19 declaration under Section 564(b)(1) of the Act, 21  U.S.C. section 360bbb-3(b)(1), unless the authorization is  terminated or revoked. Performed at [facility], [place] [address], [place], [place] [address]   Culture, blood (Routine X 2) w Reflex to ID Panel     Status: None (Preliminary result)   Collection Time: 08/02/19  1:40 AM   Specimen: BLOOD  Result Value Ref Range Status   Specimen Description   Final    BLOOD LEFT ANTECUBITAL Performed at [place] [address]  [place], [place] [address]    Special Requests   Final    BOTTLES DRAWN AEROBIC AND ANAEROBIC Blood Culture adequate volume Performed at [place] [address] [place], [place] [address]    Culture   Final    NO GROWTH 4 DAYS Performed at [facility], [place] [address], [place], [place] [address]    Report Status PENDING  Incomplete  Culture, blood (Routine X 2) w Reflex to ID Panel     Status: None (Preliminary result)   Collection Time: 08/02/19  1:45 AM   Specimen: BLOOD  Result Value Ref Range Status   Specimen Description   Final  BLOOD LEFT ANTECUBITAL Performed at [place] [address] [place], [place] [address]    Special Requests   Final    BOTTLES DRAWN AEROBIC AND ANAEROBIC Blood Culture adequate volume Performed at [facility] [address] [place], [place] [address]    Culture   Final    NO GROWTH 4 DAYS Performed at [facility], [place] [address] [place], [place] [address]    Report Status PENDING  Incomplete  MRSA PCR Screening     Status: None   Collection Time: 08/03/19  5:35 PM   Specimen: Nasal Mucosa; Nasopharyngeal  Result Value Ref Range Status   MRSA by PCR NEGATIVE NEGATIVE Final    Comment:        The GeneXpert MRSA Assay (FDA approved for NASAL specimens only), is one component of a comprehensive MRSA colonization surveillance program. It is not intended to diagnose MRSA infection nor to guide or monitor treatment for MRSA infections. Performed at [facility], [place] [address] [place], [place] [address]          Radiology Studies: No results found.      Scheduled Meds: . amLODipine  5 mg Oral Daily  . aspirin  300 mg Rectal Daily   Or  . aspirin  325 mg Oral Daily  . atorvastatin  40 mg Oral q1800  . clopidogrel  75 mg Oral Daily  . cyanocobalamin  1,000 mcg Intramuscular Daily  . enoxaparin (LOVENOX) injection  30 mg  Subcutaneous Q24H  . feeding supplement (ENSURE ENLIVE)  237 mL Oral BID BM  . fenofibrate  54 mg Oral Daily  . fluticasone furoate-vilanterol  1 puff Inhalation Daily  . insulin aspart  0-9 Units Subcutaneous TID WC  . losartan  50 mg Oral Daily  . metoprolol tartrate  50 mg Oral BID  . sodium chloride flush  3 mL Intravenous Q12H   Continuous Infusions: . lactated ringers       LOS: 5 days    Time spent: 35 minutes.     Elmarie Shiley, MD [facility]   If 7PM-7AM, please contact night-coverage [email or web address]  08/07/2019, 8:23 AM

## 2019-08-07 NOTE — TOC Progression Note (Signed)
Transition of Care [facility]) - Progression Note    Patient Details  Name: Hibah Giacona MRN: [id number] Date of Birth: May 08, 1941  Transition of Care [facility]) CM/SW Contact  Tripton Ned, Juliann Pulse, RN Phone Number: 08/07/2019, 3:35 PM  Clinical Narrative: PT recc HHPT w/24hr supv-TC Nanine Means [facility]-unable to provide care-left vm w/dtr Mickel Baas w/call back tel # to discuss higher level of care needed,if can fax out to SNF-await call back.      Expected Discharge Plan: Assisted Living Barriers to Discharge: Continued Medical Work up  Expected Discharge Plan and Services Expected Discharge Plan: Assisted Living   Discharge Planning Services: CM Consult   Living arrangements for the past 2 months: Assisted Living Facility                                       Social Determinants of Health (SDOH) Interventions    Readmission Risk Interventions No flowsheet data found.

## 2019-08-07 NOTE — Progress Notes (Signed)
SLP Cancellation Note  Patient Details Name: Chloe Fletcher MRN: [id number] DOB: Nov 17, 1940   Cancelled treatment:       Reason Eval/Treat Not Completed: Other (comment)(pt off the floor at MRI at this time, will continue efforts)  Kathleen Lime, MS [facility] SLP Acute Rehab Services Office [phone]  Macario Golds 08/07/2019, 3:28 PM

## 2019-08-07 NOTE — Progress Notes (Signed)
Interim Note  - Consulted for AMS, felt this was likely to be 2/2 hypertensive urgency and delirium on dementia. MRI brain showed punctate right mid frontal infarction, likely incidental and most likely in the setting of extreme HTN. 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

## 2019-08-07 NOTE — Progress Notes (Signed)
Physical Therapy Treatment Patient Details Name: Chloe Fletcher MRN: [id number] DOB: 07/29/1940 Today's Date: 08/07/2019    History of Present Illness 79 yo female admitted with hypertensive urgency. MRI + acute CVA. Hx of dementia, DM, CKD, COPD    PT Comments    Pt found in bed, incontinent of bowel, with stool on hands. Assisted pt into bathroom-total assist for hygiene and donning gown. Pt walked ~100 feet on today with at least 1 point of UE support. She remains at risk for falls. She demonstrates poor safety awareness. Will continue with gait training and continue to assess need for RW use-may be difficulty due to dementia. Highly recommend 24 hour supervision/assist at ALF. If this is not available, may need SNF placement.     Follow Up Recommendations  Home health PT;Supervision/Assistance - 24 hour(at ALF as long as facility can provide increased supervision/assistance. If not, may need SNF.)     Equipment Recommendations  None recommended by PT    Recommendations for Other Services       Precautions / Restrictions Precautions Precautions: Fall Restrictions Weight Bearing Restrictions: No    Mobility  Bed Mobility Overal bed mobility: Needs Assistance Bed Mobility: Supine to Sit     Supine to sit: Supervision     General bed mobility comments: cues required.  Transfers Overall transfer level: Needs assistance Equipment used: 1 person hand held assist Transfers: Sit to/from Stand Sit to Stand: Min assist         General transfer comment: Unsteady. Cues for safety  Ambulation/Gait Ambulation/Gait assistance: Min assist Gait Distance (Feet): 100 Feet Assistive device: 1 person hand held assist Gait Pattern/deviations: Decreased step length - right;Decreased step length - left;Shuffle     General Gait Details: Assist to stabilize. Pt either used IV pole vs hallway handrail. When holding on to both, she was able to take longer steps/balance better. She is  unsteady and at risk for falls.   Stairs             Wheelchair Mobility    Modified Rankin (Stroke Patients Only)       Balance Overall balance assessment: Needs assistance         Standing balance support: Single extremity supported;During functional activity Standing balance-Leahy Scale: Fair                              Cognition Arousal/Alertness: Awake/alert Behavior During Therapy: Restless(tangential) Overall Cognitive Status: History of cognitive impairments - at baseline Area of Impairment: Orientation;Attention;Memory;Following commands;Safety/judgement;Problem solving                 Orientation Level: Disoriented to;Place;Time;Situation Current Attention Level: Focused Memory: Decreased short-term memory;Decreased recall of precautions Following Commands: Follows one step commands with increased time Safety/Judgement: Decreased awareness of safety;Decreased awareness of deficits   Problem Solving: Slow processing;Decreased initiation;Requires verbal cues General Comments: Pt is tangential. Very confused. Able to be redirected with time.      Exercises      General Comments        Pertinent Vitals/Pain Pain Assessment: No/denies pain    Home Living                      Prior Function            PT Goals (current goals can now be found in the care plan section) Progress towards PT goals: Progressing toward goals    Frequency  Min 3X/week      PT Plan Current plan remains appropriate    Co-evaluation              AM-PAC PT "6 Clicks" Mobility   Outcome Measure  Help needed turning from your back to your side while in a flat bed without using bedrails?: A Little Help needed moving from lying on your back to sitting on the side of a flat bed without using bedrails?: A Little Help needed moving to and from a bed to a chair (including a wheelchair)?: A Little Help needed standing up from a chair  using your arms (e.g., wheelchair or bedside chair)?: A Little Help needed to walk in hospital room?: A Little Help needed climbing 3-5 steps with a railing? : A Little 6 Click Score: 18    End of Session Equipment Utilized During Treatment: Gait belt Activity Tolerance: Patient tolerated treatment well Patient left: in chair;with call bell/phone within reach;with chair alarm set   PT Visit Diagnosis: Unsteadiness on feet (R26.81);Difficulty in walking, not elsewhere classified (R26.2);Other symptoms and signs involving the nervous system (R29.898)     Time: 1019-1050 PT Time Calculation (min) (ACUTE ONLY): 31 min  Charges:  $Gait Training: 8-22 mins $Therapeutic Activity: 8-22 mins                        Doreatha Massed, PT Acute Rehabilitation

## 2019-08-07 NOTE — NC FL2 (Addendum)
[place] LEVEL OF CARE SCREENING TOOL     IDENTIFICATION  Patient Name: Chloe Fletcher Birthdate: 05-Mar-1941 Sex: female Admission Date (Current Location): 08/01/2019  [facility] and [place] Number:  Herbalist and Address:  [facility],  [place], [place]      Provider Number: [id number]  Attending Physician Name and Address:  Elmarie Shiley, MD  Relative Name and Phone Number:  Daisy Lazar [phone]    Current Level of Care: Hospital Recommended Level of Care: [place] Prior Approval Number:    Date Approved/Denied:   PASRR Number:  [id number]  Discharge Plan: SNF   Current Diagnoses: Patient Active Problem List   Diagnosis Date Noted  . Hypertensive crisis 08/02/2019  . Acute cystitis 08/02/2019  . Hypertensive urgency 08/02/2019  . Diabetes mellitus without complication ([place])   . CKD (chronic kidney disease) stage 3, GFR 30-59 ml/min   . COPD (chronic obstructive pulmonary disease) (HCC)     Orientation RESPIRATION BLADDER Height & Weight     Self, Time, Situation, Place  Normal Continent Weight: 53.8 kg Height:  5\' 2"  (157.5 cm)  BEHAVIORAL SYMPTOMS/MOOD NEUROLOGICAL BOWEL NUTRITION STATUS      Continent Diet(CHO MOD)  AMBULATORY STATUS COMMUNICATION OF NEEDS Skin   Limited Assist Verbally Normal                       Personal Care Assistance Level of Assistance  Bathing, Feeding, Dressing Bathing Assistance: Limited assistance Feeding assistance: Limited assistance Dressing Assistance: Limited assistance     Functional Limitations Info  Sight, Hearing, Speech Sight Info: Adequate Hearing Info: Impaired(Bilateral Hearing aids) Speech Info: Adequate    SPECIAL CARE FACTORS FREQUENCY  PT (By licensed PT), OT (By licensed OT)     PT Frequency: 5x week OT Frequency: 5x week            Contractures Contractures Info: Not present    Additional Factors Info  Code  Status, Allergies, Insulin Sliding Scale Code Status Info: Full code Allergies Info: NKA   Insulin Sliding Scale Info: SSI       Current Medications (08/07/2019):  This is the current hospital active medication list Current Facility-Administered Medications  Medication Dose Route Frequency Provider Last Rate Last Admin  . acetaminophen (TYLENOL) tablet 650 mg  650 mg Oral Q4H PRN Shawna Clamp, MD   650 mg at 08/04/19 1709   Or  . acetaminophen (TYLENOL) 160 MG/5ML solution 650 mg  650 mg Per Tube Q4H PRN Shawna Clamp, MD       Or  . acetaminophen (TYLENOL) suppository 650 mg  650 mg Rectal Q4H PRN Shawna Clamp, MD      . albuterol (PROVENTIL) (2.5 MG/3ML) 0.083% nebulizer solution 3 mL  3 mL Inhalation Q4H PRN Howerter, Justin B, DO      . amLODipine (NORVASC) tablet 5 mg  5 mg Oral Daily Howerter, Justin B, DO   5 mg at 08/07/19 1100  . aspirin suppository 300 mg  300 mg Rectal Daily Shawna Clamp, MD       Or  . aspirin tablet 325 mg  325 mg Oral Daily Shawna Clamp, MD   325 mg at 08/07/19 1100  . atorvastatin (LIPITOR) tablet 40 mg  40 mg Oral q1800 Regalado, Belkys A, MD      . clopidogrel (PLAVIX) tablet 75 mg  75 mg Oral Daily Howerter, Justin B, DO   75  mg at 08/07/19 1100  . cyanocobalamin ((VITAMIN B-12)) injection 1,000 mcg  1,000 mcg Intramuscular Daily Regalado, Belkys A, MD   1,000 mcg at 08/07/19 1300  . enoxaparin (LOVENOX) injection 30 mg  30 mg Subcutaneous Q24H Shawna Clamp, MD   30 mg at 08/07/19 1300  . feeding supplement (ENSURE ENLIVE) (ENSURE ENLIVE) liquid 237 mL  237 mL Oral BID BM Howerter, Justin B, DO   237 mL at 08/07/19 1100  . fenofibrate tablet 54 mg  54 mg Oral Daily Howerter, Justin B, DO   54 mg at 08/07/19 1100  . fluticasone furoate-vilanterol (BREO ELLIPTA) 100-25 MCG/INH 1 puff  1 puff Inhalation Daily Howerter, Justin B, DO   1 puff at 08/07/19 0735  . insulin aspart (novoLOG) injection 0-9 Units  0-9 Units Subcutaneous TID WC Howerter,  Justin B, DO   1 Units at 08/07/19 1300  . labetalol (NORMODYNE) injection 10 mg  10 mg Intravenous Q2H PRN Howerter, Justin B, DO   10 mg at 08/04/19 1706  . LORazepam (ATIVAN) tablet 0.5 mg  0.5 mg Oral Q6H PRN Shawna Clamp, MD   0.5 mg at 08/06/19 1722  . losartan (COZAAR) tablet 50 mg  50 mg Oral Daily Shawna Clamp, MD   50 mg at 08/07/19 1100  . metoprolol tartrate (LOPRESSOR) tablet 50 mg  50 mg Oral BID Howerter, Justin B, DO   50 mg at 08/07/19 1100  . nicotine (NICODERM CQ - dosed in mg/24 hours) patch 14 mg  14 mg Transdermal Daily PRN Howerter, Justin B, DO   14 mg at 08/06/19 0813  . sodium chloride flush (NS) 0.9 % injection 3 mL  3 mL Intravenous Q12H Howerter, Justin B, DO   3 mL at 08/06/19 2100     Discharge Medications: Please see discharge summary for a list of discharge medications.  Relevant Imaging Results:  Relevant Lab Results:   Additional Information SS#[phone], Juliann Pulse, RN

## 2019-08-07 NOTE — TOC Progression Note (Signed)
Transition of Care [facility]) - Progression Note    Patient Details  Name: Lenamarie Burghart MRN: [id number] Date of Birth: Mar 13, 1941  Transition of Care [facility]) CM/SW Contact  Sayid Moll, Juliann Pulse, RN Phone Number: 08/07/2019, 4:57 PM  Clinical Narrative: dtr in agreement to fax out to SNF-need pasrr-30day note in shadow chart for MD to sign.      Expected Discharge Plan: [place] Barriers to Discharge: Continued Medical Work up  Expected Discharge Plan and Services Expected Discharge Plan: [place]   Discharge Planning Services: CM Consult   Living arrangements for the past 2 months: Assisted Living Facility                                       Social Determinants of Health (SDOH) Interventions    Readmission Risk Interventions No flowsheet data found.

## 2019-08-08 LAB — GLUCOSE, CAPILLARY
Glucose-Capillary: 113 mg/dL — ABNORMAL HIGH (ref 70–99)
Glucose-Capillary: 160 mg/dL — ABNORMAL HIGH (ref 70–99)
Glucose-Capillary: 104 mg/dL — ABNORMAL HIGH (ref 70–99)
Glucose-Capillary: 106 mg/dL — ABNORMAL HIGH (ref 70–99)

## 2019-08-08 MED ORDER — LOSARTAN POTASSIUM 50 MG PO TABS
50.0000 mg | ORAL_TABLET | Freq: Every day | ORAL | Status: DC
  Administered 2019-08-09: 50 mg via ORAL
  Filled 2019-08-08: qty 1

## 2019-08-08 MED ORDER — LOSARTAN POTASSIUM 50 MG PO TABS: 100.0000 mg | ORAL_TABLET | Freq: Every day | ORAL | Status: DC

## 2019-08-08 NOTE — TOC Progression Note (Signed)
Transition of Care [facility]) - Progression Note    Patient Details  Name: Chloe Fletcher MRN: [id number] Date of Birth: Aug 30, 1940  Transition of Care [facility]) CM/SW Contact  Kamyla Olejnik, Juliann Pulse, RN Phone Number: 08/08/2019, 4:01 PM  Clinical Narrative: awaiting pasrr for SNF.      Expected Discharge Plan: [place] Barriers to Discharge: Continued Medical Work up  Expected Discharge Plan and Services Expected Discharge Plan: [place]   Discharge Planning Services: CM Consult   Living arrangements for the past 2 months: Assisted Living Facility                                       Social Determinants of Health (SDOH) Interventions    Readmission Risk Interventions No flowsheet data found.

## 2019-08-08 NOTE — TOC Progression Note (Signed)
Transition of Care [facility]) - Progression Note    Patient Details  Name: Chloe Fletcher MRN: [id number] Date of Birth: 18-Mar-1941  Transition of Care [facility]) CM/SW Contact  Jacqeline Broers, Juliann Pulse, RN Phone Number: 08/08/2019, 10:15 AM  Clinical Narrative: Faxed additional info to [facility] for pasrr-awaiting outcome of review.      Expected Discharge Plan: [place] Barriers to Discharge: Continued Medical Work up  Expected Discharge Plan and Services Expected Discharge Plan: [place]   Discharge Planning Services: CM Consult   Living arrangements for the past 2 months: Assisted Living Facility                                       Social Determinants of Health (SDOH) Interventions    Readmission Risk Interventions No flowsheet data found.

## 2019-08-08 NOTE — Care Management Important Message (Signed)
Important Message  Patient Details IM Letter given to Dessa Phi RN Case Manager to present to the Patient Name: Malery Pellerito MRN: [id number] Date of Birth: 11/13/1940   Medicare Important Message Given:  Yes     Alicai, Rentie 08/08/2019, 12:35 PM

## 2019-08-08 NOTE — Progress Notes (Signed)
Occupational Therapy Treatment Patient Details Name: Chloe Fletcher MRN: [id number] DOB: 1940/09/18 Today's Date: 08/08/2019    History of present illness 79 yo female admitted with hypertensive urgency. MRI + acute CVA. Hx of dementia, DM, CKD, COPD   OT comments  Pt with good participation with OT.   Pt with hx of dementia. Pt followed commands - but will need 24/7 A for safety upon DC  Follow Up Recommendations  Supervision/Assistance - 24 hour;Home health OT;Other (comment)(vs SNF if 24/7 supervision cannot be provided at ALF)    Equipment Recommendations  None recommended by OT    Recommendations for Other Services      Precautions / Restrictions Precautions Precautions: Fall       Mobility Bed Mobility Overal bed mobility: Needs Assistance Bed Mobility: Supine to Sit     Supine to sit: Supervision     General bed mobility comments: cues required.  Transfers Overall transfer level: Needs assistance Equipment used: 1 person hand held assist Transfers: Sit to/from [facility] Sit to Stand: Min assist Stand pivot transfers: Min assist       General transfer comment: Unsteady. Cues for safety    Balance Overall balance assessment: Needs assistance         Standing balance support: Single extremity supported;During functional activity Standing balance-Leahy Scale: Fair                             ADL either performed or assessed with clinical judgement   ADL Overall ADL's : Needs assistance/impaired Eating/Feeding: Minimal assistance;Sitting   Grooming: Wash/dry face;Standing                   Toilet Transfer: Minimal assistance;Cueing for safety;Cueing for sequencing;Ambulation;Regular Toilet   Toileting- Water quality scientist and Hygiene: Sitting/lateral lean;Sit to/from stand;Minimal assistance                         Cognition Arousal/Alertness: Awake/alert Behavior During Therapy: WFL for tasks  assessed/performed(tangential) Overall Cognitive Status: History of cognitive impairments - at baseline Area of Impairment: Orientation;Attention;Memory;Following commands;Safety/judgement;Problem solving                 Orientation Level: Disoriented to;Place;Time;Situation Current Attention Level: Focused Memory: Decreased short-term memory;Decreased recall of precautions Following Commands: Follows one step commands with increased time Safety/Judgement: Decreased awareness of safety;Decreased awareness of deficits   Problem Solving: Slow processing;Decreased initiation;Requires verbal cues                     Pertinent Vitals/ Pain       Pain Assessment: No/denies pain         Frequency  Min 2X/week        Progress Toward Goals  OT Goals(current goals can now be found in the care plan section)  Progress towards OT goals: Progressing toward goals   progressing  Plan   back to ALF       AM-PAC OT "6 Clicks" Daily Activity     Outcome Measure   Help from another person eating meals?: A Little Help from another person taking care of personal grooming?: A Little Help from another person toileting, which includes using toliet, bedpan, or urinal?: A Lot Help from another person bathing (including washing, rinsing, drying)?: A Little Help from another person to put on and taking off regular upper body clothing?: A Little Help from another person to put on  and taking off regular lower body clothing?: A Little 6 Click Score: 17    End of Session    OT Visit Diagnosis: Unsteadiness on feet (R26.81);Muscle weakness (generalized) (M62.81);Other symptoms and signs involving cognitive function   Activity Tolerance Patient tolerated treatment well   Patient Left with call bell/phone within reach;in chair;with chair alarm set   Nurse Communication Mobility status        Time: [id number] OT Time Calculation (min): 16 min  Charges: OT General Charges $OT  Visit: 1 Visit OT Treatments $Self Care/Home Management : 8-22 mins  Kari Baars, [place] [phone] Office- [phone]      Clovia Reine, Edwena Felty D 08/08/2019, 2:41 PM

## 2019-08-08 NOTE — Progress Notes (Signed)
PROGRESS NOTE    Chloe Fletcher  [id number] DOB: [birth year] DOA: 08/01/2019 PCP: Dulce Sellar, MD   Brief Narrative: 8 YoF with Hx significant for dementia, hypertension, type 2 diabetes, stage III chronic kidney disease, chronic tobacco abuse, COPD who was admitted to [facility] long hospital on 08/02/2019 with hypertensive crisis after presenting from skilled nursing facility for elevation of high blood pressure. Patient blood pressure fairly improved with resumption of medication.  Patient continued to remain confused w/ baseline dementia. She was treated for urinary tract infection with 5 days of ceftriaxone.  CT head was unremarkable.  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  LDL 156, A1c 5.7, echo with EF 60 to 65% normal LA size no thrombus, carotid Dopplers left and right ICA less than 39% plaque. Neurology advised to continue aspirin 325 mg daily, Lipitor 40 g daily and target blood pressure goal of less than 140 PT OT B12 injection and outpatient neurology follow-up for dementia.  Subjective: And examined resting company on the bedside chair.  Alert awake oriented to self, answer some questions but pleasantly confused  Assessment & Plan:   Hypertensive urgency: Blood pressure borderline controlled on Norvasc Cozaar and metoprolol Norvasc was increased to 10 mg 2/9.  If still high will increase Cozaar to 100 mg.  Continue to monitor blood pressure.  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  LDL 156, A1c 5.7, echo with EF 60 to 65% normal LA size no thrombus, carotid Dopplers left and right ICA less than 39% plaque. Neurology advised to continue aspirin 325 mg daily, will stop plavix, contLipitor 40 g daily and target blood pressure goal of less than 140 PT OT B12 injection and outpatient neurology follow-up for dementia.  Dementia with confusion pleasantly confused continue B12 supplementation.  Has been in this state for few weeks now.  Son notes a rapid decline from initial forgetfulness in past month since November. Neurology advised possibly starting on Namenda/Aricept to help with dementia but outpatient follow-up advised  UTI completed 5 days of ceftriaxone.  Urine culture insignificant growth.  CKD stage 3b: Creatinine stable at 1.3.  Monitor.  Optimize blood pressure  Diabetes mellitus without complication: Blood sugar is controlled.  Hold oral meds, continue sliding scale scale insulin.  Hemoglobin A1c stable at 5.7  COPD: Stable continue inhaler, she is on room air.  Low B12 continue supplementation  Body mass index is 21.69 kg/m.    DVT prophylaxis:Lovenox Code Status: FULL Family Communication: plan of care discussed with patient's son over the phone Disposition Plan:Patient is from: Assisted living facility.  Recently transferred from son's house to assisted living facility. Anticipated d/c to: Skilled nursing facility Barriers to d/c or conditions that needs to be met prior to d/c:  Pending  PSAAR no and insurance approval for  SNF.  Consultants:Neuro Procedures:see note Microbiology:see note Antimicrobials: Anti-infectives (From admission, onward)   Start     Dose/Rate Route Frequency Ordered Stop   08/02/19 0215  cefTRIAXone (ROCEPHIN) 2 g in sodium chloride 0.9 % 100 mL IVPB  Status:  Discontinued     2 g 200 mL/hr over 30 Minutes Intravenous Daily 08/02/19 0140 08/07/19 0814      Medications: Scheduled Meds: . amLODipine  10 mg Oral Daily  . aspirin  300 mg Rectal Daily   Or  . aspirin  325 mg Oral Daily  . atorvastatin  40 mg Oral q1800  . cyanocobalamin  1,000 mcg Intramuscular Daily  . enoxaparin (LOVENOX) injection  30 mg Subcutaneous Q24H  . feeding supplement (ENSURE ENLIVE)  237 mL Oral BID BM  . fenofibrate  54 mg Oral Daily  . fluticasone furoate-vilanterol  1 puff Inhalation Daily  . insulin aspart  0-9 Units Subcutaneous TID WC  . [START ON 08/09/2019] losartan  50 mg Oral Daily  . metoprolol tartrate  50 mg Oral BID  . sodium chloride flush  3 mL Intravenous Q12H   Continuous Infusions:  Objective: Vitals:   08/08/19 0213 08/08/19 0526 08/08/19 1007 08/08/19 1008  BP: (!) 180/43 (!) 158/52 (!) 170/51   Pulse: 72 87 87 90  Resp: '18 18 16   ' Temp: 98.3 F (36.8 C) 97.8 F (36.6 C)    TempSrc:      SpO2: 96% 98%  98%  Weight:      Height:        Intake/Output Summary (Last 24 hours) at 08/08/2019 1226 Last data filed at 08/07/2019 2200 Gross per 24 hour  Intake 412.68 ml  Output --  Net 412.68 ml   Filed Weights   08/01/19 1426 08/02/19 0500 08/05/19 0500  Weight: 54.4 kg 55.8 kg 53.8 kg   Weight change:   Body mass index is 21.69 kg/m.  Intake/Output from previous day: 02/08 0701 - 02/09 0700 In: 412.7 [P.O.:40; I.V.:372.7] Out: -  Intake/Output this shift: No intake/output data recorded.  Examination:  General exam: AAO to self,NAD, Weak appearing. HEENT:Oral mucosa moist, Ear/Nose WNL grossly, dentition normal. Respiratory system: Clear bilaterally,no  wheezing or crackles,no use of accessory muscle Cardiovascular system: S1 & S2 +, No JVD,. Gastrointestinal system: Abdomen soft, NT,ND, BS+ Nervous System:Alert, awake, moving extremities and grossly nonfocal Extremities: No edema, distal peripheral pulses palpable.  Skin: No rashes,no icterus. MSK: Normal muscle bulk,tone, power  Data Reviewed: I have personally reviewed following labs and imaging studies  CBC: Recent Labs  Lab 08/02/19 0516 08/03/19 0507 08/04/19 0415 08/05/19 0454 08/07/19 0817  WBC 8.0 6.3 5.7 7.5 7.6  HGB 10.9* 10.2* 10.2* 11.3* 9.9*  HCT 32.5* 31.0* 30.9* 34.1* 30.3*  MCV 95.6 97.2 97.8 96.3 97.1  PLT 307 321 329 350 841   Basic Metabolic Panel: Recent Labs  Lab 08/02/19 0516 08/02/19 0516 08/03/19 0507 08/04/19 0415 08/05/19 0454 08/06/19 0527 08/07/19 0817  NA 133*   < > 135 137 136 135 134*  K 3.3*   < > 3.9 3.9 3.4* 4.0 4.0  CL 100   < > 104 105 97* 101 101  CO2 23   < > '22 23 23 27 25  ' GLUCOSE 134*   < > 114* 109* 132* 120* 128*  BUN 20   < > 26* 29* 23 29* 26*  CREATININE 1.47*   < > 1.60* 1.68* 1.34* 1.59*  1.38*  CALCIUM 9.1   < > 9.1 8.9 9.0 9.3 9.0  MG 1.8  --  2.1 2.0 2.1  --   --   PHOS  --   --  4.6 4.4 4.1  --   --    < > = values in this interval not displayed.   GFR: Estimated Creatinine Clearance: 26.6 mL/min (A) (by C-G formula based on SCr of 1.38 mg/dL (H)). Liver Function Tests: Recent Labs  Lab 08/01/19 1508 08/02/19 0516 08/03/19 0507 08/06/19 0527  AST 17 16 14* 13*  ALT '13 13 13 14  ' ALKPHOS 49 47 42 44  BILITOT 0.9 0.5 0.5 0.6  PROT 6.5 5.9* 5.6* 6.0*  ALBUMIN 3.4* 3.1* 2.7* 2.8*   Recent Labs  Lab 08/01/19 1508  LIPASE 36   No results for input(s): AMMONIA in the last 168 hours. Coagulation Profile: No results for input(s): INR, PROTIME in the last 168 hours. Cardiac Enzymes: No results for input(s): CKTOTAL, CKMB, CKMBINDEX, TROPONINI in the last 168 hours. BNP (last 3 results) No results for  input(s): PROBNP in the last 8760 hours. HbA1C: No results for input(s): HGBA1C in the last 72 hours. CBG: Recent Labs  Lab 08/07/19 1135 08/07/19 1626 08/07/19 2112 08/08/19 0734 08/08/19 1146  GLUCAP 140* 96 100* 113* 160*   Lipid Profile: No results for input(s): CHOL, HDL, LDLCALC, TRIG, CHOLHDL, LDLDIRECT in the last 72 hours. Thyroid Function Tests: No results for input(s): TSH, T4TOTAL, FREET4, T3FREE, THYROIDAB in the last 72 hours. Anemia Panel: No results for input(s): VITAMINB12, FOLATE, FERRITIN, TIBC, IRON, RETICCTPCT in the last 72 hours. Sepsis Labs: No results for input(s): PROCALCITON, LATICACIDVEN in the last 168 hours.  Recent Results (from the past 240 hour(s))  Culture, Urine     Status: Abnormal   Collection Time: 08/01/19  8:07 PM   Specimen: Urine, Clean Catch  Result Value Ref Range Status   Specimen Description   Final    URINE, CLEAN CATCH Performed at [facility], [place] [address], [place], [place] [address]    Special Requests   Final    NONE Performed at [facility], [place] [address] [place], [place] [address]    Culture (A)  Final    <10,000 COLONIES/mL INSIGNIFICANT GROWTH Performed at [place] [address] [place], [place] [address]    Report Status 08/03/2019 FINAL  Final  Respiratory Panel by RT PCR (Flu A&B, Covid) - Nasopharyngeal Swab     Status: None   Collection Time: 08/02/19 12:26 AM   Specimen: Nasopharyngeal Swab  Result Value Ref Range Status   SARS Coronavirus 2 by RT PCR NEGATIVE NEGATIVE Final    Comment: (NOTE) SARS-CoV-2 target nucleic acids are NOT DETECTED. The SARS-CoV-2 RNA is generally detectable in upper respiratoy specimens during the acute phase of infection. The lowest concentration of SARS-CoV-2 viral copies this assay can detect is 131 copies/mL. A negative result does not preclude SARS-Cov-2 infection and should not be used as the sole basis for treatment  or other patient management decisions. A negative result may occur with  improper specimen collection/handling, submission of specimen other than nasopharyngeal swab, presence of viral mutation(s) within the areas targeted by this assay, and inadequate number of viral copies (<131 copies/mL). A negative result must be combined with clinical observations, patient history, and epidemiological information. The expected result is Negative. Fact Sheet for Patients:  PinkCheek.be Fact Sheet for Healthcare Providers:  GravelBags.it This test is  not yet ap proved or cleared by the [place] and  has been authorized for detection and/or diagnosis of SARS-CoV-2 by FDA under an Emergency Use Authorization (EUA). This EUA will remain  in effect (meaning this test can be used) for the duration of the COVID-19 declaration under Section 564(b)(1) of the Act, 21 U.S.C. section 360bbb-3(b)(1), unless the authorization is terminated or revoked sooner.    Influenza A by PCR NEGATIVE NEGATIVE Final   Influenza B by PCR NEGATIVE NEGATIVE Final    Comment: (NOTE) The Xpert Xpress SARS-CoV-2/FLU/RSV assay is intended as an aid in  the diagnosis of influenza from Nasopharyngeal swab specimens and  should not be used as a sole basis for treatment. Nasal washings and  aspirates are unacceptable for Xpert Xpress SARS-CoV-2/FLU/RSV  testing. Fact Sheet for Patients: PinkCheek.be Fact Sheet for Healthcare Providers: GravelBags.it This test is not yet approved or cleared by the [place] FDA and  has been authorized for detection and/or diagnosis of SARS-CoV-2 by  FDA under an Emergency Use Authorization (EUA). This EUA will remain  in effect (meaning this test can be used) for the duration of the  Covid-19 declaration under Section 564(b)(1) of the Act, 21  U.S.C. section  360bbb-3(b)(1), unless the authorization is  terminated or revoked. Performed at [facility], [place] [address] [place], [place] [address]   Culture, blood (Routine X 2) w Reflex to ID Panel     Status: None (Preliminary result)   Collection Time: 08/02/19  1:40 AM   Specimen: BLOOD  Result Value Ref Range Status   Specimen Description   Final    BLOOD LEFT ANTECUBITAL Performed at [place] [address] [place], [place] [address]    Special Requests   Final    BOTTLES DRAWN AEROBIC AND ANAEROBIC Blood Culture adequate volume Performed at [place] [address] [place], [place] [address]    Culture   Final    NO GROWTH 4 DAYS Performed at [facility], [place] [address] [place], [place] [address]    Report Status PENDING  Incomplete  Culture, blood (Routine X 2) w Reflex to ID Panel     Status: None (Preliminary result)   Collection Time: 08/02/19  1:45 AM   Specimen: BLOOD  Result Value Ref Range Status   Specimen Description   Final    BLOOD LEFT ANTECUBITAL Performed at [place] [address] [place], [place] [address]    Special Requests   Final    BOTTLES DRAWN AEROBIC AND ANAEROBIC Blood Culture adequate volume Performed at [place] [address], [place], [place] [address]    Culture   Final    NO GROWTH 4 DAYS Performed at [facility], [place] [address] [place], [place] [address]    Report Status PENDING  Incomplete  MRSA PCR Screening     Status: None   Collection Time: 08/03/19  5:35 PM   Specimen: Nasal Mucosa; Nasopharyngeal  Result Value Ref Range Status   MRSA by PCR NEGATIVE NEGATIVE Final    Comment:        The GeneXpert MRSA Assay (FDA approved for NASAL specimens only), is one component of a comprehensive MRSA colonization surveillance program. It is not intended to diagnose MRSA infection nor to guide or monitor  treatment for MRSA infections. Performed at [facility], [place] [address] [place], [place] [address]  Radiology Studies: MR ANGIO HEAD WO CONTRAST  Result Date: 08/07/2019 CLINICAL DATA:  Stroke/TIA, assess extracranial arteries. EXAM: MRA HEAD WITHOUT CONTRAST TECHNIQUE: Angiographic images of the Circle of Willis were obtained using MRA technique without intravenous contrast. COMPARISON:  Brain MRI 08/03/2019, head CT 08/01/2019 FINDINGS: The examination is moderately motion degraded, limiting evaluation. The intracranial internal carotid arteries are patent without high-grade stenosis. 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 This aneurysm measures up to 8 mm in diameter and is fusiform with suspected saccular components (Series 3, image 97). The M1 middle cerebral arteries are patent without significant stenosis. The M2 and more distal MCA vessels are poorly assessed on the current study. The anterior cerebral arteries are patent without appreciable high-grade proximal stenosis. Portions of the intracranial right vertebral artery are poorly delineated. This may be due to developmentally small vessel size and motion degradation. High-grade stenosis cannot be excluded. The intracranial left vertebral artery is patent without significant stenosis. The basilar artery is patent without high-grade stenosis. Predominantly fetal origin of the right posterior cerebral artery. The posterior cerebral arteries are patent proximally, but otherwise poorly assessed due to the degree of motion degradation. IMPRESSION: Significantly motion degraded and limited examination as described. 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 Given the motion degradation on the current exam, consider CTA for further  characterization when clinically appropriate. Portions of the intracranial right vertebral artery are poorly delineated. This may be due to small vessel size and motion degradation. High-grade stenosis cannot be excluded. Within described limitations, no definite proximal branch occlusion or high-grade proximal arterial stenosis identified elsewhere. Electronically Signed   By: Kellie Simmering DO   On: 08/07/2019 15:57     LOS: 6 days   Time spent: More than 50% of that time was spent in counseling and/or coordination of care.  Antonieta Pert, MD [facility]  08/08/2019, 12:26 PM

## 2019-08-08 NOTE — Progress Notes (Signed)
  Speech Language Pathology Treatment: Dysphagia  Patient Details Name: Chloe Fletcher MRN: [id number] DOB: 1940-12-25 Today's Date: 08/08/2019 Time: 0051-1021 SLP Time Calculation (min) (ACUTE ONLY): 16 min  Assessment / Plan / Recommendation Clinical Impression  Pt with partially consumed lunch tray at bedside. Poor intake noted and pt's meat was dry - She demonstrated excessive mastication with oral retention without awareness.  Cued liquid and applesauce swallows helpful to clear oral retention.  Pt likely will tolerate casserole style foods better due to improved cohesion and moist.  Pt will benefit from intermittent supervision to reinforce intake and assure oral clearance.  Provided her with Ensure after she declined to consume more of her meal *with RNs permission.  With use of orientation strategies written on white board ([place] [facility], [place] and stroke) but was oriented with max cues.  She would benefit from repetition for orientation when she transfers to SNF.    Recommend continue diet and no SLP follow up indicated.  Pt informed to findings/recommendations but her cognitive deficit decreases her generalization.    HPI HPI: 79 yo female adm to [facility] with AMS, HTN crisis.  Pt PMH + for COPD, dementia, HTN, CKD, tobacco use,   Pt with increased confusion compared to normal per son's statement to RN yesterday.  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  Speech/swallow eval ordered. Pt stays at [facility].  SLP follow up to assess po tolerance and cognitive status.      SLP Plan  All goals met       Recommendations  Diet recommendations: Dysphagia 3 (mechanical soft);Thin liquid Liquids provided via: Cup;Straw Medication Administration: (with puree if problematic) Supervision: Staff to assist with self feeding Compensations: Slow rate;Small sips/bites Postural Changes and/or Swallow Maneuvers: Seated upright 90 degrees;Upright 30-60  min after meal                Oral Care Recommendations: Oral care BID Follow up Recommendations: Skilled Nursing facility SLP Visit Diagnosis: Dysphagia, oral phase (R13.11) Plan: All goals met       [place], Pantelis Elgersma Ann 08/08/2019, 1:15 PM

## 2019-08-09 LAB — GLUCOSE, CAPILLARY
Glucose-Capillary: 121 mg/dL — ABNORMAL HIGH (ref 70–99)
Glucose-Capillary: 158 mg/dL — ABNORMAL HIGH (ref 70–99)
Glucose-Capillary: 128 mg/dL — ABNORMAL HIGH (ref 70–99)
Glucose-Capillary: 137 mg/dL — ABNORMAL HIGH (ref 70–99)
Glucose-Capillary: 114 mg/dL — ABNORMAL HIGH (ref 70–99)

## 2019-08-09 LAB — SARS CORONAVIRUS 2 (TAT 6-24 HRS): SARS Coronavirus 2: NEGATIVE

## 2019-08-09 MED ORDER — ADULT MULTIVITAMIN W/MINERALS CH
1.0000 | ORAL_TABLET | Freq: Every day | ORAL | Status: DC
  Administered 2019-08-09: 1 via ORAL
  Filled 2019-08-09: qty 1

## 2019-08-09 MED ORDER — VITAMIN B-12 1000 MCG PO TABS: 1000.0000 ug | ORAL_TABLET | Freq: Every day | ORAL | Status: DC

## 2019-08-09 MED ORDER — ENSURE ENLIVE PO LIQD: 237.0000 mL | Freq: Two times a day (BID) | ORAL | Status: DC

## 2019-08-09 MED ORDER — BOOST / RESOURCE BREEZE PO LIQD CUSTOM: 1.0000 | ORAL | Status: DC

## 2019-08-09 NOTE — Progress Notes (Signed)
Pt BP 187/148(159). PRN Labetalol given as ordered for SBP >180.   0602: When attempting to give PRN, IV went bad. Tylenol given at this time as well.   [id number]: New IV placed and PRN med given.  TRH made aware, instructed to pass along to day time.  Pt asleep at this time. This RN to recheck BP prior to end of shift. On-coming RN made aware of events.

## 2019-08-09 NOTE — Progress Notes (Signed)
Physical Therapy Treatment Patient Details Name: Chloe Fletcher MRN: [id number] DOB: 07/29/1940 Today's Date: 08/09/2019    History of Present Illness 79 yo female admitted with hypertensive urgency. MRI + acute CVA. Hx of dementia, DM, CKD, COPD    PT Comments    Progressing with mobility.    Follow Up Recommendations  SNF     Equipment Recommendations  None recommended by PT    Recommendations for Other Services       Precautions / Restrictions Precautions Precautions: Fall Restrictions Weight Bearing Restrictions: No    Mobility  Bed Mobility               General bed mobility comments: oob in recliner  Transfers Overall transfer level: Needs assistance   Transfers: Sit to/from Stand Sit to Stand: Min guard         General transfer comment: close guard for safety.  Ambulation/Gait Ambulation/Gait assistance: Min assist Gait Distance (Feet): 225 Feet Assistive device: None Gait Pattern/deviations: Step-through pattern     General Gait Details: Intermittent unsteadiness. Distracted by environment.   Stairs             Wheelchair Mobility    Modified Rankin (Stroke Patients Only)       Balance Overall balance assessment: Needs assistance         Standing balance support: During functional activity Standing balance-Leahy Scale: Fair                              Cognition Arousal/Alertness: Awake/alert Behavior During Therapy: WFL for tasks assessed/performed(tangential; a bit impulsive at times) Overall Cognitive Status: History of cognitive impairments - at baseline Area of Impairment: Orientation;Attention;Memory;Following commands;Safety/judgement;Problem solving                 Orientation Level: Disoriented to;Place;Time;Situation   Memory: Decreased short-term memory;Decreased recall of precautions Following Commands: Follows one step commands with increased time Safety/Judgement: Decreased awareness  of safety;Decreased awareness of deficits     General Comments: Pt is tangential. Easily distracted. Able to be redirected.      Exercises      General Comments        Pertinent Vitals/Pain Pain Assessment: No/denies pain    Home Living                      Prior Function            PT Goals (current goals can now be found in the care plan section) Progress towards PT goals: Progressing toward goals    Frequency    Min 2X/week      PT Plan Current plan remains appropriate    Co-evaluation              AM-PAC PT "6 Clicks" Mobility   Outcome Measure  Help needed turning from your back to your side while in a flat bed without using bedrails?: A Little Help needed moving from lying on your back to sitting on the side of a flat bed without using bedrails?: A Little Help needed moving to and from a bed to a chair (including a wheelchair)?: A Little Help needed standing up from a chair using your arms (e.g., wheelchair or bedside chair)?: A Little Help needed to walk in hospital room?: A Little Help needed climbing 3-5 steps with a railing? : A Little 6 Click Score: 18    End of Session Equipment Utilized During  Treatment: Gait belt Activity Tolerance: Patient tolerated treatment well Patient left: in chair;with call bell/phone within reach;with chair alarm set   PT Visit Diagnosis: Unsteadiness on feet (R26.81);Other symptoms and signs involving the nervous system (R29.898)     Time: [id number] PT Time Calculation (min) (ACUTE ONLY): 16 min  Charges:  $Gait Training: 8-22 mins                         Doreatha Massed, PT Acute Rehabilitation

## 2019-08-09 NOTE — TOC Progression Note (Signed)
Transition of Care [facility]) - Progression Note    Patient Details  Name: Chloe Fletcher MRN: [id number] Date of Birth: 1941-04-18  [facility]) CM/SW Contact  Steel Kerney, Juliann Pulse, RN Phone Number: 08/09/2019, 3:13 PM  Clinical Narrative: dtr Mickel Baas chose [facility], awaiting covid results.      Expected Discharge Plan: Skilled Nursing Facility Barriers to Discharge: Other (comment)(covid results)  Expected Discharge Plan and Services Expected Discharge Plan: [place]   Discharge Planning Services: CM Consult   Living arrangements for the past 2 months: Assisted Living Facility                                       Social Determinants of Health (SDOH) Interventions    Readmission Risk Interventions No flowsheet data found.

## 2019-08-09 NOTE — Progress Notes (Signed)
Initial Nutrition Assessment  DOCUMENTATION CODES:   Not applicable  INTERVENTION:  - continue Ensure Enlive BID, each supplement provides 350 kcal and 20 grams of protein. - will order Boost Breeze once/day, each supplement provides 250 kcal and 9 grams of protein. - will order Magic Cup BID with meals, each supplement provides 290 kcal and 9 grams of protein. - will order daily multivitamin with minerals.    NUTRITION DIAGNOSIS:   Inadequate oral intake related to acute illness, lethargy/confusion as evidenced by meal completion < 50%.  GOAL:   Patient will meet greater than or equal to 90% of their needs  MONITOR:   PO intake, Supplement acceptance, Labs, Weight trends  REASON FOR ASSESSMENT:   Malnutrition Screening Tool  ASSESSMENT:   79 year-old with medical history of dementia, HTN, type 2 DM, stage 3 CKD, chronic tobacco abuse, and COPD. She was admitted from SNF on 2/3 with hypertensive crisis and UTI. CT head unremarkable. 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  Patient is noted to be a/o to self only and unable to provide information. Per flow sheet documentation, she was mainly eating 0-25% of meals from 2/3-2/6. Today she consumed 50% of breakfast and 20% of lunch (total of 419 kcal, 16 grams protein). Per review of order, she has been accepting Ensure Enlive 50-75% of the time offered.   Per chart review, weight on admission (2/2) was 120 lb and weight today is 113 lb. Will continue to monitor closely. Patient at high risk for malnutrition.   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 Medications reviewed; 1000 mcg intramuscular vitamin B12 x1 dose/day 2/8-2/10, sliding scale novolog, 1000 mcg oral cyanocobalamin/day  starting 2/10.    NUTRITION - FOCUSED PHYSICAL EXAM:  completed; no muscle or fat wasting noted.   Diet Order:   Diet Order            Diet Carb Modified Fluid consistency: Thin; Room service appropriate? No  Diet effective now              EDUCATION NEEDS:   No education needs have been identified at this time  Skin:  Skin Assessment: Reviewed RN Assessment  Last BM:  2/9  Height:   Ht Readings from Last 1 Encounters:  08/01/19 5\' 2"  (1.575 m)    Weight:   Wt Readings from Last 1 Encounters:  08/09/19 51.2 kg    Ideal Body Weight:  50 kg  BMI:  Body mass index is 20.65 kg/m.  Estimated Nutritional Needs:   Kcal:  1540-1790 kcal  Protein:  70-80 grams  Fluid:  >/= 1.6 L/day     Jarome Matin, MS, RD, LDN, CNSC Inpatient Clinical Dietitian RD pager # available in [facility]  After hours/weekend pager # available in [facility]

## 2019-08-09 NOTE — TOC Progression Note (Signed)
Transition of Care [facility]) - Progression Note    Patient Details  Name: Chloe Fletcher MRN: [id number] Date of Birth: 1940/11/16  [facility]) CM/SW Contact  Briannah Lona, Juliann Pulse, RN Phone Number: 08/09/2019, 11:20 AM  Clinical Narrative:  Received pasrr#, dr to order covid, left vm w/dtr Mickel Baas [phone]-have pasrr#, just waiting on SNF bed choice.     Expected Discharge Plan: Skilled Nursing Facility Barriers to Discharge: SNF Pending bed offer(Awaiting SNF bed choice from dtr-left message.)  Expected Discharge Plan and Services Expected Discharge Plan: [place]   Discharge Planning Services: CM Consult   Living arrangements for the past 2 months: Assisted Living Facility                                       Social Determinants of Health (SDOH) Interventions    Readmission Risk Interventions No flowsheet data found.

## 2019-08-09 NOTE — TOC Progression Note (Signed)
Transition of Care [facility]) - Progression Note    Patient Details  Name: Chloe Fletcher MRN: [id number] Date of Birth: June 05, 1941  Transition of Care [facility]) CM/SW Contact  Ayansh Feutz, Juliann Pulse, RN Phone Number: 08/09/2019, 2:25 PM  Clinical Narrative:TC again to dtr Mickel Baas [phone] for SNF choice.       Expected Discharge Plan: Skilled Nursing Facility Barriers to Discharge: SNF Pending bed offer(Awaiting SNF bed choice from dtr-left message.)  Expected Discharge Plan and Services Expected Discharge Plan: [place]   Discharge Planning Services: CM Consult   Living arrangements for the past 2 months: Assisted Living Facility                                       Social Determinants of Health (SDOH) Interventions    Readmission Risk Interventions No flowsheet data found.

## 2019-08-09 NOTE — Progress Notes (Signed)
PROGRESS NOTE    Chloe Fletcher  CHE:[id number] DOB: 1941-05-26 DOA: 08/01/2019 PCP: Dulce Sellar, MD   Brief Narrative: 69 YoF with Hx significant for dementia, hypertension, type 2 diabetes, stage III chronic kidney disease, chronic tobacco abuse, COPD who was admitted to [facility] long hospital on 08/02/2019 with hypertensive crisis after presenting from skilled nursing facility for elevation of high blood pressure. Patient blood pressure fairly improved with resumption of medication.  Patient continued to remain confused w/ baseline dementia. She was treated for urinary tract infection with 5 days of ceftriaxone.  CT head was unremarkable.  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  LDL 156, A1c 5.7, echo with EF 60 to 65% normal LA size no thrombus, carotid Dopplers left and right ICA less than 39% plaque. Neurology advised to continue aspirin 325 mg daily, Lipitor 40 g daily and target blood pressure goal of less than 140 PT OT B12 injection and outpatient neurology follow-up for dementia.  Subjective: Seen/examined On bedside chair having her meal, alert,awaek oriented to self.?plac. Follows commands Pleasantly confused BP intermittently high needing labetalol.  Assessment & Plan:   Hypertensive urgency: Blood pressure borderline controlled on Norvasc Cozaar and metoprolol Norvasc was increased to 10 mg 2/9. Monitor-if still high will increase Cozaar to 100 mg today- repeat bp this afternoon in 130s.   Continue to monitor blood pressure and optimize meds.  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  LDL 156, A1c 5.7, echo with EF 60 to 65% normal LA size no thrombus, carotid Dopplers left and right ICA less than 39% plaque. Neurology advised to continue aspirin 325 mg daily, will stop plavix, contLipitor 40 g daily and target blood pressure goal of less than 140 PT OT B12 injection and outpatient neurology follow-up for dementia.  Dementia with confusion pleasantly confused continue B12 supplementation.Has been in this mental state for few weeks now.Son notes a rapid decline from initial forgetfulness in past month since November. Neurology advised possibly starting on Namenda/Aricept to help with dementia but outpatient follow-up advised.   UTI completed 5 days of ceftriaxone.  Urine culture insignificant growth.  CKD stage 3b: Creatinine stable at 1.3.  Monitor.  Optimize blood pressure  Diabetes mellitus without complication: Blood sugar is controlled.  Hold oral meds, continue sliding scale scale insulin.  Hemoglobin A1c stable at 5.7  COPD: Stable continue inhaler, she is on room air.  Low B12 continue supplementation po. Finished 3 doses if inj.  Body mass index is 20.65 kg/m.    DVT prophylaxis:Lovenox Code Status: FULL Family Communication: plan of care discussed with patient's son over the phone Disposition Plan:Patient is from: Assisted living facility.  Recently transferred from son's house to assisted living facility. Anticipated d/c to: Skilled nursing  facility Barriers to d/c or conditions that needs to be met prior to d/c: Pending  PSAAR no and insurance approval for SNF.  Consultants:Neuro Procedures:see note Microbiology:see note Antimicrobials: Anti-infectives (From admission, onward)   Start     Dose/Rate Route Frequency Ordered Stop   08/02/19 0215  cefTRIAXone (ROCEPHIN) 2 g in sodium chloride 0.9 % 100 mL IVPB  Status:  Discontinued     2 g 200 mL/hr over 30 Minutes Intravenous Daily 08/02/19 0140 08/07/19 0814      Medications: Scheduled Meds: . amLODipine  10 mg Oral Daily  . aspirin  300 mg Rectal Daily   Or  . aspirin  325 mg Oral Daily  . atorvastatin  40 mg Oral q1800  . enoxaparin (LOVENOX) injection  30 mg Subcutaneous Q24H  . feeding supplement (ENSURE ENLIVE)  237 mL Oral BID BM  . fenofibrate  54 mg Oral Daily  . fluticasone furoate-vilanterol  1 puff Inhalation Daily  . insulin aspart  0-9 Units Subcutaneous TID WC  . losartan  50 mg Oral Daily  . metoprolol tartrate  50 mg Oral BID  . sodium chloride flush  3 mL Intravenous Q12H   Continuous Infusions:  Objective: Vitals:   08/09/19 0558 08/09/19 0654 08/09/19 0713 08/09/19 1013  BP: (!) 187/148 (!) 191/53 (!) 176/47 (!) 159/56  Pulse: 72 77 68 79  Resp:    16  Temp:      TempSrc:      SpO2:    96%  Weight:      Height:        Intake/Output Summary (Last 24 hours) at 08/09/2019 1033 Last data filed at 08/09/2019 0540 Gross per 24 hour  Intake 120 ml  Output 200 ml  Net -80 ml   Filed Weights   08/02/19 0500 08/05/19 0500 08/09/19 0520  Weight: 55.8 kg 53.8 kg 51.2 kg   Weight change:   Body mass index is 20.65 kg/m.  Intake/Output from previous day: 02/09 0701 - 02/10 0700 In: 270 [P.O.:270] Out: 200 [Urine:200] Intake/Output this shift: No intake/output data recorded.  Examination:  General exam: Alert awake oriented to self or date of birth,  Says she is in hospital.follows commands appropriately  HEENT:Oral mucosa moist,  Ear/Nose WNL grossly, dentition normal. Respiratory system: Clear bilaterally, no wheezing or crackles,no use of accessory muscle Cardiovascular system: S1 & S2 +, No JVD,. Gastrointestinal system: Abdomen soft, NT,ND, BS+ Nervous System:Alert, awake, moving extremities and grossly nonfocal Extremities: No edema, distal peripheral pulses palpable.  Skin: No rashes,no icterus. MSK: Normal muscle bulk,tone, power  Data Reviewed: I have personally reviewed following labs and imaging studies  CBC: Recent Labs  Lab 08/03/19 0507 08/04/19 0415 08/05/19 0454 08/07/19 0817  WBC 6.3 5.7 7.5 7.6  HGB 10.2* 10.2* 11.3* 9.9*  HCT 31.0* 30.9* 34.1* 30.3*  MCV 97.2 97.8 96.3 97.1  PLT 321 329 350 993   Basic Metabolic Panel: Recent Labs  Lab 08/03/19 0507 08/04/19 0415 08/05/19 0454 08/06/19 0527 08/07/19 0817  NA 135 137 136 135 134*  K 3.9 3.9 3.4* 4.0 4.0  CL 104 105 97* 101 101  CO2 '22 23 23 27 25  ' GLUCOSE 114* 109* 132* 120* 128*  BUN 26* 29* 23 29* 26*  CREATININE 1.60* 1.68* 1.34* 1.59* 1.38*  CALCIUM 9.1 8.9 9.0 9.3 9.0  MG 2.1 2.0 2.1  --   --   PHOS 4.6 4.4 4.1  --   --    GFR: Estimated Creatinine  Clearance: 26.6 mL/min (A) (by C-G formula based on SCr of 1.38 mg/dL (H)). Liver Function Tests: Recent Labs  Lab 08/03/19 0507 08/06/19 0527  AST 14* 13*  ALT 13 14  ALKPHOS 42 44  BILITOT 0.5 0.6  PROT 5.6* 6.0*  ALBUMIN 2.7* 2.8*   No results for input(s): LIPASE, AMYLASE in the last 168 hours. No results for input(s): AMMONIA in the last 168 hours. Coagulation Profile: No results for input(s): INR, PROTIME in the last 168 hours. Cardiac Enzymes: No results for input(s): CKTOTAL, CKMB, CKMBINDEX, TROPONINI in the last 168 hours. BNP (last 3 results) No results for input(s): PROBNP in the last 8760 hours. HbA1C: No results for input(s): HGBA1C in the last 72 hours. CBG: Recent Labs  Lab 08/08/19 0734 08/08/19 1146 08/08/19 1817 08/08/19 2102  08/09/19 0719  GLUCAP 113* 160* 104* 106* 121*   Lipid Profile: No results for input(s): CHOL, HDL, LDLCALC, TRIG, CHOLHDL, LDLDIRECT in the last 72 hours. Thyroid Function Tests: No results for input(s): TSH, T4TOTAL, FREET4, T3FREE, THYROIDAB in the last 72 hours. Anemia Panel: No results for input(s): VITAMINB12, FOLATE, FERRITIN, TIBC, IRON, RETICCTPCT in the last 72 hours. Sepsis Labs: No results for input(s): PROCALCITON, LATICACIDVEN in the last 168 hours.  Recent Results (from the past 240 hour(s))  Culture, Urine     Status: Abnormal   Collection Time: 08/01/19  8:07 PM   Specimen: Urine, Clean Catch  Result Value Ref Range Status   Specimen Description   Final    URINE, CLEAN CATCH Performed at [facility], [place] [address] [place], [place] [address]    Special Requests   Final    NONE Performed at [facility], [place] [address], [place], [place] [address]    Culture (A)  Final    <10,000 COLONIES/mL INSIGNIFICANT GROWTH Performed at [place] [address], [place], [place] [address]    Report Status 08/03/2019 FINAL  Final  Respiratory Panel by RT PCR (Flu A&B, Covid) - Nasopharyngeal Swab     Status: None   Collection Time: 08/02/19 12:26 AM   Specimen: Nasopharyngeal Swab  Result Value Ref Range Status   SARS Coronavirus 2 by RT PCR NEGATIVE NEGATIVE Final    Comment: (NOTE) SARS-CoV-2 target nucleic acids are NOT DETECTED. The SARS-CoV-2 RNA is generally detectable in upper respiratoy specimens during the acute phase of infection. The lowest concentration of SARS-CoV-2 viral copies this assay can detect is 131 copies/mL. A negative result does not preclude SARS-Cov-2 infection and should not be used as the sole basis for treatment or other patient management decisions. A negative result may occur with  improper specimen collection/handling, submission of specimen other than nasopharyngeal swab, presence of  viral mutation(s) within the areas targeted by this assay, and inadequate number of viral copies (<131 copies/mL). A negative result must be combined with clinical observations, patient history, and epidemiological information. The expected result is Negative. Fact Sheet for Patients:  PinkCheek.be Fact Sheet for Healthcare Providers:  GravelBags.it This test is not yet ap proved or cleared by the [place] FDA and  has been authorized for detection and/or diagnosis of SARS-CoV-2 by FDA under an Emergency Use Authorization (EUA). This EUA will remain  in effect (meaning this test can be used) for the duration of the COVID-19 declaration under Section 564(b)(1) of the Act, 21 U.S.C. section 360bbb-3(b)(1), unless the authorization is terminated or revoked sooner.    Influenza A by PCR NEGATIVE NEGATIVE  Final   Influenza B by PCR NEGATIVE NEGATIVE Final    Comment: (NOTE) The Xpert Xpress SARS-CoV-2/FLU/RSV assay is intended as an aid in  the diagnosis of influenza from Nasopharyngeal swab specimens and  should not be used as a sole basis for treatment. Nasal washings and  aspirates are unacceptable for Xpert Xpress SARS-CoV-2/FLU/RSV  testing. Fact Sheet for Patients: PinkCheek.be Fact Sheet for Healthcare Providers: GravelBags.it This test is not yet approved or cleared by the [place] FDA and  has been authorized for detection and/or diagnosis of SARS-CoV-2 by  FDA under an Emergency Use Authorization (EUA). This EUA will remain  in effect (meaning this test can be used) for the duration of the  Covid-19 declaration under Section 564(b)(1) of the Act, 21  U.S.C. section 360bbb-3(b)(1), unless the authorization is  terminated or revoked. Performed at [facility], [place] [address], [place], [place] [address]   Culture, blood (Routine X 2) w  Reflex to ID Panel     Status: None (Preliminary result)   Collection Time: 08/02/19  1:40 AM   Specimen: BLOOD  Result Value Ref Range Status   Specimen Description   Final    BLOOD LEFT ANTECUBITAL Performed at [place] [address] [place], [place] [address]    Special Requests   Final    BOTTLES DRAWN AEROBIC AND ANAEROBIC Blood Culture adequate volume Performed at [place] [address] [place], [place] [address]    Culture   Final    NO GROWTH 4 DAYS Performed at [facility], [place] [address], [place], [place] [address]    Report Status PENDING  Incomplete  Culture, blood (Routine X 2) w Reflex to ID Panel     Status: None (Preliminary result)   Collection Time: 08/02/19  1:45 AM   Specimen: BLOOD  Result Value Ref Range Status   Specimen Description   Final    BLOOD LEFT ANTECUBITAL Performed at [place] [address], [place], [place] [address]    Special Requests   Final    BOTTLES DRAWN AEROBIC AND ANAEROBIC Blood Culture adequate volume Performed at [facility] [address] [place], [place] [address]    Culture   Final    NO GROWTH 4 DAYS Performed at [facility], [place] [address], [place], [place] [address]    Report Status PENDING  Incomplete  MRSA PCR Screening     Status: None   Collection Time: 08/03/19  5:35 PM   Specimen: Nasal Mucosa; Nasopharyngeal  Result Value Ref Range Status   MRSA by PCR NEGATIVE NEGATIVE Final    Comment:        The GeneXpert MRSA Assay (FDA approved for NASAL specimens only), is one component of a comprehensive MRSA colonization surveillance program. It is not intended to diagnose MRSA infection nor to guide or monitor treatment for MRSA infections. Performed at [facility], [place] [address] [place], [place] [address]     Radiology Studies: MR ANGIO HEAD WO CONTRAST  Result Date:  08/07/2019 CLINICAL DATA:  Stroke/TIA, assess extracranial arteries. EXAM: MRA HEAD WITHOUT CONTRAST TECHNIQUE: Angiographic images of the Circle of Willis were obtained using MRA technique without intravenous contrast. COMPARISON:  Brain MRI 08/03/2019, head CT 08/01/2019 FINDINGS: The examination is moderately motion degraded, limiting evaluation. The intracranial internal carotid arteries are patent without high-grade stenosis. There is aneurysmal dilatation of the cavernous right internal  carotid artery which is somewhat poorly assessed due to the degree of motion degradation. This aneurysm measures up to 8 mm in diameter and is fusiform with suspected saccular components (Series 3, image 97). The M1 middle cerebral arteries are patent without significant stenosis. The M2 and more distal MCA vessels are poorly assessed on the current study. The anterior cerebral arteries are patent without appreciable high-grade proximal stenosis. Portions of the intracranial right vertebral artery are poorly delineated. This may be due to developmentally small vessel size and motion degradation. High-grade stenosis cannot be excluded. The intracranial left vertebral artery is patent without significant stenosis. The basilar artery is patent without high-grade stenosis. Predominantly fetal origin of the right posterior cerebral artery. The posterior cerebral arteries are patent proximally, but otherwise poorly assessed due to the degree of motion degradation. IMPRESSION: Significantly motion degraded and limited examination as described. 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 Given the motion degradation on the current exam, consider CTA for further characterization when clinically appropriate. Portions of the intracranial right vertebral artery are poorly delineated. This may be due to small vessel size and motion degradation. High-grade stenosis  cannot be excluded. Within described limitations, no definite proximal branch occlusion or high-grade proximal arterial stenosis identified elsewhere. Electronically Signed   By: Kellie Simmering DO   On: 08/07/2019 15:57     LOS: 7 days   Time spent: More than 50% of that time was spent in counseling and/or coordination of care.  Antonieta Pert, MD [facility]  08/09/2019, 10:33 AM

## 2019-08-10 LAB — GLUCOSE, CAPILLARY: Glucose-Capillary: 122 mg/dL — ABNORMAL HIGH (ref 70–99)

## 2019-08-10 MED ORDER — AMLODIPINE BESYLATE 10 MG PO TABS: 10.0000 mg | ORAL_TABLET | Freq: Every day | ORAL | Status: AC

## 2019-08-10 MED ORDER — CYANOCOBALAMIN 1000 MCG PO TABS: 1000.0000 ug | ORAL_TABLET | Freq: Every day | ORAL | Status: DC

## 2019-08-10 MED ORDER — ATORVASTATIN CALCIUM 40 MG PO TABS: 40.0000 mg | ORAL_TABLET | Freq: Every day | ORAL | Status: AC

## 2019-08-10 MED ORDER — ADULT MULTIVITAMIN W/MINERALS CH: 1.0000 | ORAL_TABLET | Freq: Every day | ORAL | Status: AC

## 2019-08-10 MED ORDER — LOSARTAN POTASSIUM 50 MG PO TABS: 50.0000 mg | ORAL_TABLET | Freq: Every day | ORAL | Status: DC

## 2019-08-10 NOTE — Progress Notes (Signed)
Patient discharged via EMS to [facility] in stable condition. Legal guardian notified of discharge and report called to nurse.

## 2019-08-10 NOTE — Discharge Summary (Signed)
Physician Discharge Summary  Deardra Hinkley CLE:[id number] DOB: Sep 03, 1940 DOA: 08/01/2019  PCP: Dulce Sellar, MD  Admit date: 08/01/2019 Discharge date: 08/10/2019  Admitted From: ALF Disposition:  SNF  Recommendations for Outpatient Follow-up:  1. Follow up with PCP in 1-2 weeks 2. Please obtain BMP/CBC in one week 3. Please follow up on the following pending results:  Home Health:NO  Equipment/Devices: NONE  Discharge Condition: Stable Code Status: FULL Diet recommendation: Heart Healthy  Brief/Interim Summary:  79 YoF with Hx significant for dementia, hypertension, type 2 diabetes, stage III chronic kidney disease, chronic tobacco abuse, COPD who was admitted to [facility] long hospital on 08/02/2019 with hypertensive crisis after presenting from skilled nursing facility for elevation of high blood pressure. Patient blood pressure fairly improved with resumption of medication. Patient continued to remain confused w/ baseline dementia. She was treated for urinary tract infection with 5 days of ceftriaxone. CT head was unremarkable. 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  LDL 156, A1c 5.7, echo with EF 60 to 65% normal LA size no thrombus, carotid Dopplers left and right ICA less than 39% plaque. Neurology advised blood pressure optimization and outpatient neurology follow-up.   At this time patient is medically stable for discharge to skilled nursing facility.  She will need follow-up with neurology  as outpatient.  Discharge Diagnoses:   Hypertensive urgency: Blood pressure borderline controlled on increased dose of Norvasc Cozaar and home dose of metoprolol . Norvasc was increased to 10 mg 2/9.  Blood pressures monitor at this time remains fairly stabilized.  Continue monitor blood pressure at SNF and if remains up can go up on Cozaar.    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  LDL 156, A1c 5.7, echo with EF 60 to 65% normal LA size no thrombus, carotid Dopplers left and right ICA less than 39% plaque. Neurology initially advised to continue aspirin 325 mg daily-again discussed with Dr. Lorraine Lax today, since her presents and will most likely hypertension related and he advised to resume home Plavix did not change her antiplatelets and contLipitor 40 g daily and target blood pressure goal of less than 140, cont  PT OT B12 injection and outpatient neurology follow-up for dementia.  Dementia with confusion pleasantly confused continue B12 supplementation.Has been in this mental state for few weeks now.Son notes a rapid decline from initial forgetfulness in past month since November. Neurology advised possibly starting on Namenda/Aricept to help with dementia but outpatient follow-up advised.   Will provide neurology office no for follow-up upon discharge   UTI completed 5 days of ceftriaxone.  Urine culture insignificant growth.  CKD stage 3b: Creatinine stable.Monitor.  Optimize blood pressure  Diabetes mellitus without complication: Blood sugar is controlled.  Hold oral meds,  continue  sliding scale scale insulin.  Hemoglobin A1c stable at 5.7  COPD: Stable continue inhaler, she is on room air.  Low B12 continue supplementation po. Finished 3 doses if inj. Cont po supplementation  Body mass index is 20.65 kg/m.    DVT prophylaxis:Lovenox Code Status: FULL Family Communication: plan of care discussed with patient's son over the phone Disposition Plan:Patient is from: Assisted living facility.  Recently transferred from son's house to assisted living facility. Anticipated d/c to: Skilled nursing facility Barriers to d/c or conditions that needs to be met prior to d/c: COVID-19 and PASAAR obtained ,at this stable for discharge to skilled nursing facility.    Consultants:Neuro Procedures:see note  Subjective: Resting comfortably in the bed this morning.  No nausea vomiting.  Alert awake and pleasantly confused.  Discharge Exam: Vitals:   08/10/19 0645 08/10/19 0838  BP: (!) 158/46   Pulse: 85   Resp: 16   Temp: 98.7 F (37.1 C)   SpO2: 96% 96%   General: Pt is alert, awake, not in acute distress Cardiovascular: RRR, S1/S2 +, no rubs, no gallops Respiratory: CTA bilaterally, no wheezing, no rhonchi Abdominal: Soft, NT, ND, bowel sounds + Extremities: no edema, no cyanosis  Discharge Instructions  Discharge Instructions    Diet - low sodium heart healthy   Complete by: As directed    Discharge instructions   Complete by: As directed    Please call call MD or return to ER for similar or worsening recurring problem that brought you to hospital or if any fever,nausea/vomiting,abdominal pain, uncontrolled pain, chest pain,  shortness of breath or any other alarming symptoms.  Please follow-up your doctor as instructed in a week time and call the office for appointment.  YOU will need to follow-up with outpatient neurology.  Please avoid alcohol, smoking, or any other illicit substance and maintain healthy habits including taking your regular  medications as prescribed.  You were cared for by a hospitalist during your hospital stay. If you have any questions about your discharge medications or the care you received while you were in the hospital after you are discharged, you can call the unit and ask to speak with the hospitalist on call if the hospitalist that took care of you is not available.  Once you are discharged, your primary care physician will handle any further medical issues. Please note that NO REFILLS for any discharge medications will be authorized once you are discharged, as it is imperative that you return to your primary care physician (or establish a relationship with a primary care physician if you do not have one) for your aftercare needs so that they can reassess your need for medications and monitor your lab values   Increase activity slowly   Complete by: As directed      Allergies as of 08/10/2019   No Known Allergies     Medication List    TAKE these medications   acetaminophen 500 MG tablet Commonly known as: TYLENOL Take 500 mg by mouth every 8 (eight) hours as needed for mild pain or headache.   amLODipine 10 MG tablet Commonly known as: NORVASC Take 1 tablet (10 mg total) by mouth daily.   atorvastatin 40 MG tablet Commonly known as: LIPITOR Take 1 tablet (40 mg total) by mouth daily at 6 PM.   Breo Ellipta 100-25 MCG/INH Aepb Generic drug: fluticasone furoate-vilanterol Inhale 1 puff into the lungs daily.   cetirizine 10 MG tablet Commonly known as: ZYRTEC Take 10 mg by  mouth daily as needed for allergies.   clopidogrel 75 MG tablet Commonly known as: PLAVIX Take 75 mg by mouth daily.   cyanocobalamin 1000 MCG tablet Take 1 tablet (1,000 mcg total) by mouth daily.   fenofibrate 48 MG tablet Commonly known as: TRICOR Take 48 mg by mouth daily.   Fish Oil 1000 MG Caps Take 1,000 mg by mouth daily.   GLUCOSAMINE 1500 COMPLEX PO Take 1 tablet by mouth 2 (two) times daily.    losartan 50 MG tablet Commonly known as: COZAAR Take 1 tablet (50 mg total) by mouth daily. What changed:   medication strength  how much to take   metFORMIN 500 MG tablet Commonly known as: GLUCOPHAGE Take 500 mg by mouth daily with breakfast.   metoprolol tartrate 50 MG tablet Commonly known as: LOPRESSOR Take 50 mg by mouth 2 (two) times daily.   multivitamin with minerals Tabs tablet Take 1 tablet by mouth daily.   Vitamin D3 25 MCG (1000 UT) Caps Take 2,000 Units by mouth daily.       Contact information for follow-up providers    [facility].   Contact information: [address]     [address] [place] [address] [place].   Why: call one of these for neurologic evaluation. Contact information: [place], [place] [phone]       Dulce Sellar, MD. Schedule an appointment as soon as possible for a visit in 2 days.   Specialty: General Practice Contact information: [place] [address] [phone]            Contact information for after-discharge care    Destination    [facility] Preferred SNF .   Service: Skilled Nursing Contact information: [address] [place] [phone]                 No Known Allergies  The results of significant diagnostics from this hospitalization (including imaging, microbiology, ancillary and laboratory) are listed below for reference.    Microbiology: Recent Results (from the past 240 hour(s))  Culture, Urine     Status: Abnormal   Collection Time: 08/01/19  8:07 PM   Specimen: Urine, Clean Catch  Result Value Ref Range Status   Specimen Description   Final    URINE, CLEAN CATCH Performed at [facility], [place] [address] [place], [place] [address]    Special Requests   Final    NONE Performed  at [facility], [place] [address], [place], [place] [address]    Culture (A)  Final    <10,000 COLONIES/mL INSIGNIFICANT GROWTH Performed at [facility] [address] [place],  [address]    Report Status 08/03/2019 FINAL  Final  Respiratory Panel by RT PCR (Flu A&B, Covid) - Nasopharyngeal Swab     Status: None   Collection Time: 08/02/19 12:26 AM   Specimen: Nasopharyngeal Swab  Result Value Ref Range Status   SARS Coronavirus 2 by RT PCR NEGATIVE NEGATIVE Final    Comment: (NOTE) SARS-CoV-2 target nucleic acids are NOT DETECTED. The SARS-CoV-2 RNA is generally detectable in upper respiratoy specimens during the acute phase of infection. The lowest concentration of SARS-CoV-2 viral copies this assay can detect is 131 copies/mL. A negative result does not preclude SARS-Cov-2 infection and should not be used as the sole basis for treatment  or other patient management decisions. A negative result may occur with  improper specimen collection/handling, submission of specimen other than nasopharyngeal swab, presence of viral mutation(s) within the areas targeted by this assay, and inadequate number of viral copies (<131 copies/mL). A negative result must be combined with clinical observations, patient history, and epidemiological information. The expected result is Negative. Fact Sheet for Patients:  PinkCheek.be Fact Sheet for Healthcare Providers:  GravelBags.it This test is not yet ap proved or cleared by the [place] FDA and  has been authorized for detection and/or diagnosis of SARS-CoV-2 by FDA under an Emergency Use Authorization (EUA). This EUA will remain  in effect (meaning this test can be used) for the duration of the COVID-19 declaration under Section 564(b)(1) of the Act, 21 U.S.C. section 360bbb-3(b)(1), unless the authorization is terminated or revoked sooner.    Influenza  A by PCR NEGATIVE NEGATIVE Final   Influenza B by PCR NEGATIVE NEGATIVE Final    Comment: (NOTE) The Xpert Xpress SARS-CoV-2/FLU/RSV assay is intended as an aid in  the diagnosis of influenza from Nasopharyngeal swab specimens and  should not be used as a sole basis for treatment. Nasal washings and  aspirates are unacceptable for Xpert Xpress SARS-CoV-2/FLU/RSV  testing. Fact Sheet for Patients: PinkCheek.be Fact Sheet for Healthcare Providers: GravelBags.it This test is not yet approved or cleared by the [place] FDA and  has been authorized for detection and/or diagnosis of SARS-CoV-2 by  FDA under an Emergency Use Authorization (EUA). This EUA will remain  in effect (meaning this test can be used) for the duration of the  Covid-19 declaration under Section 564(b)(1) of the Act, 21  U.S.C. section 360bbb-3(b)(1), unless the authorization is  terminated or revoked. Performed at [facility], [place] [address] [place], [place] [address]   Culture, blood (Routine X 2) w Reflex to ID Panel     Status: None (Preliminary result)   Collection Time: 08/02/19  1:40 AM   Specimen: BLOOD  Result Value Ref Range Status   Specimen Description   Final    BLOOD LEFT ANTECUBITAL Performed at [facility] [address], [place], [place] [address]    Special Requests   Final    BOTTLES DRAWN AEROBIC AND ANAEROBIC Blood Culture adequate volume Performed at [facility] [address] [place], [place] [address]    Culture   Final    NO GROWTH 4 DAYS Performed at [facility], [place] [address] [place], [place] [address]    Report Status PENDING  Incomplete  Culture, blood (Routine X 2) w Reflex to ID Panel     Status: None (Preliminary result)   Collection Time: 08/02/19  1:45 AM   Specimen: BLOOD  Result Value Ref Range Status   Specimen Description   Final    BLOOD  LEFT ANTECUBITAL Performed at [facility] [address] [place], [place] [address]    Special Requests   Final    BOTTLES DRAWN AEROBIC AND ANAEROBIC Blood Culture adequate volume Performed at [facility] [address], [place], [place] [address]    Culture   Final    NO GROWTH 4 DAYS Performed at [facility], [place] [address] [place], [place] [address]    Report Status PENDING  Incomplete  MRSA PCR Screening     Status: None   Collection Time: 08/03/19  5:35 PM   Specimen: Nasal Mucosa; Nasopharyngeal  Result Value Ref Range Status   MRSA by PCR NEGATIVE NEGATIVE Final    Comment:        The GeneXpert MRSA Assay (FDA approved for NASAL specimens only), is one component of a comprehensive MRSA colonization surveillance program. It is not intended to diagnose MRSA infection nor to guide or monitor treatment for MRSA infections. Performed at [facility], [place] [address], [place], [place] [address]   SARS CORONAVIRUS 2 (TAT 6-24 HRS) Nasopharyngeal Nasopharyngeal Swab     Status: None   Collection Time: 08/09/19  2:31 PM   Specimen: Nasopharyngeal Swab  Result Value Ref Range Status   SARS Coronavirus 2 NEGATIVE NEGATIVE Final    Comment: (NOTE) SARS-CoV-2 target nucleic acids are NOT DETECTED. The SARS-CoV-2 RNA is generally detectable in upper and lower respiratory specimens during the acute phase of infection. Negative results do not preclude SARS-CoV-2 infection, do not rule out co-infections with other pathogens, and should not be used as the sole basis for treatment or other patient management decisions. Negative results must be combined with clinical observations, patient history, and epidemiological information. The expected result is Negative. Fact Sheet for Patients: SugarRoll.be Fact Sheet for Healthcare Providers: https://www.woods-mathews.com/ This  test is not yet approved or cleared by the [place] FDA and  has been authorized for detection and/or diagnosis of SARS-CoV-2 by FDA under an Emergency Use Authorization (EUA). This EUA will remain  in effect (meaning this test can be used) for the duration of the COVID-19 declaration under Section 56 4(b)(1) of the Act, 21 U.S.C. section 360bbb-3(b)(1), unless the authorization is terminated or revoked sooner. Performed at [facility], [place] [address] [place], [place] [address]     Procedures/Studies: CT Head Wo Contrast  Result Date: 08/01/2019 CLINICAL DATA:  Elevated blood pressure left-sided pain. EXAM: CT HEAD WITHOUT CONTRAST TECHNIQUE: Contiguous axial images were obtained from the base of the skull through the vertex without intravenous contrast. COMPARISON:  January 01, 2017 FINDINGS: Brain: There is moderate severity cerebral atrophy with widening of the extra-axial spaces and ventricular dilatation. There are areas of decreased attenuation within the white matter tracts of the supratentorial brain, consistent with microvascular disease changes. 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 Vascular: No hyperdense vessel or unexpected calcification. Skull: Normal. Negative for fracture or focal lesion. Sinuses/Orbits: No acute finding. Other: None. IMPRESSION: No acute intracranial pathology. Electronically Signed   By: Virgina Norfolk M.D.   On: 08/01/2019 19:42   MR ANGIO HEAD WO CONTRAST  Result Date: 08/07/2019 CLINICAL DATA:  Stroke/TIA, assess extracranial arteries. EXAM: MRA HEAD WITHOUT CONTRAST TECHNIQUE: Angiographic images of the Circle of Willis were obtained using MRA technique without intravenous contrast. COMPARISON:  Brain MRI 08/03/2019, head CT 08/01/2019 FINDINGS: The examination is moderately motion degraded, limiting evaluation. The intracranial internal carotid arteries are patent without high-grade stenosis. There is aneurysmal dilatation of  the cavernous right internal carotid artery which is somewhat poorly assessed due to the degree of motion degradation. This aneurysm measures up to 8 mm in diameter and is fusiform with suspected saccular components (Series 3, image 97). The M1 middle cerebral arteries are patent without significant stenosis. The M2 and more distal MCA vessels are poorly assessed on the current study. The anterior cerebral arteries are patent without appreciable high-grade proximal stenosis. Portions of the intracranial right vertebral artery are poorly delineated. This may be due to developmentally small vessel size and motion degradation. High-grade stenosis cannot be excluded.  The intracranial left vertebral artery is patent without significant stenosis. The basilar artery is patent without high-grade stenosis. Predominantly fetal origin of the right posterior cerebral artery. The posterior cerebral arteries are patent proximally, but otherwise poorly assessed due to the degree of motion degradation. IMPRESSION: Significantly motion degraded and limited examination as described. 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 Given the motion degradation on the current exam, consider CTA for further characterization when clinically appropriate. Portions of the intracranial right vertebral artery are poorly delineated. This may be due to small vessel size and motion degradation. High-grade stenosis cannot be excluded. Within described limitations, no definite proximal branch occlusion or high-grade proximal arterial stenosis identified elsewhere. Electronically Signed   By: Kellie Simmering DO   On: 08/07/2019 15:57   MR BRAIN WO CONTRAST  Result Date: 08/03/2019 CLINICAL DATA:  Encephalopathy.  Altered mental status EXAM: MRI HEAD WITHOUT CONTRAST TECHNIQUE: Multiplanar, multiecho pulse sequences of the brain and surrounding structures were obtained without  intravenous contrast. COMPARISON:  CT head 08/01/2019 FINDINGS: Brain: Incomplete study degraded by motion. Patient not able to complete the examination. 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 Several small areas of intermediate signal on diffusion-weighted imaging in the parietal lobes and left temporoparietal lobe appear to be chronic ischemia. Generalized atrophy, moderate. Moderate changes throughout the white matter compatible chronic microvascular ischemia. No fluid collection or midline shift. Chronic ischemic changes also in the pons and cerebellum bilaterally. Chronic ischemia in the right caudate head. Vascular: Normal arterial flow voids Skull and upper cervical spine: No focal skeletal lesion. Sinuses/Orbits: Paranasal sinuses clear. Bilateral cataract extraction Other: None IMPRESSION: 3 mm acute infarct right mid frontal cortex. Atrophy and extensive chronic  ischemia. Electronically Signed   By: Franchot Gallo M.D.   On: 08/03/2019 17:17   CT Renal Stone Study  Result Date: 08/01/2019 CLINICAL DATA:  Flank pain. EXAM: CT ABDOMEN AND PELVIS WITHOUT CONTRAST TECHNIQUE: Multidetector CT imaging of the abdomen and pelvis was performed following the standard protocol without IV contrast. COMPARISON:  January 01, 2017 FINDINGS: Lower chest: No acute abnormality. Hepatobiliary: No focal liver abnormality is seen. No gallstones, gallbladder wall thickening, or biliary dilatation. Pancreas: Unremarkable. No pancreatic ductal dilatation or surrounding inflammatory changes. Spleen: Normal in size without focal abnormality. Adrenals/Urinary Tract: Adrenal glands are unremarkable. Kidneys are normal in size. A 1.0 cm cyst is seen along the posterior aspect of the mid right kidney. Adjacent 3 mm nonobstructing renal stones are seen within the lower pole of the left kidney. Bladder is unremarkable. Stomach/Bowel: Stomach is within normal limits. Appendix appears normal. No evidence of bowel wall  thickening, distention, or inflammatory changes. Vascular/Lymphatic: Marked severity aortic atherosclerosis. No enlarged abdominal or pelvic lymph nodes. Reproductive: Status post hysterectomy. No adnexal masses. Other: No abdominal wall hernia or abnormality. No abdominopelvic ascites. Musculoskeletal: Multilevel degenerative changes seen throughout the lumbar spine. IMPRESSION: 1. Adjacent 3 mm nonobstructing renal stones within the left kidney. 2. Small right renal cyst. Aortic Atherosclerosis (ICD10-I70.0). Electronically Signed   By: Virgina Norfolk M.D.   On: 08/01/2019 17:07   ECHOCARDIOGRAM COMPLETE BUBBLE STUDY  Result Date: 08/04/2019   ECHOCARDIOGRAM REPORT   Patient Name:   SANYA KOBRIN Date of Exam: 08/04/2019 Medical Rec #:  [id number]   Height:       62.0 in Accession #:    [id number]  Weight:       123.0 lb Date of  Birth:  1940-07-13   BSA:          1.55 m Patient Age:    79 years    BP:           186/72 mmHg Patient Gender: F           HR:           61 bpm. Exam Location:  Inpatient Procedure: 2D Echo, Color Doppler, Cardiac Doppler and Saline Contrast Bubble            Study Indications:    Stroke  History:        Patient has no prior history of Echocardiogram examinations.                 Signs/Symptoms:Dementia; Risk Factors:Hypertension and Diabetes.  Sonographer:    Maudry Mayhew MHA, RDMS, RVT, RDCS Referring Phys: [phone] [facility]  Sonographer Comments: Technically difficult study due to poor echo windows. Image acquisition challenging due to patient body habitus and Image acquisition challenging due to uncooperative patient. IMPRESSIONS  1. Technically difficult study. Bubble study appears negative, with no evidence of interatrial shunt  2. Left ventricular ejection fraction, by visual estimation, is 60 to 65%. The left ventricle has normal function. There is moderately increased left ventricular hypertrophy.  3. Left ventricular diastolic parameters are consistent with Grade II  diastolic dysfunction (pseudonormalization).  4. Elevated left atrial pressure.  5. Global right ventricle has normal systolic function.The right ventricular size is mildly enlarged.  6. The mitral valve is normal in structure. No evidence of mitral valve regurgitation.  7. The tricuspid valve is grossly normal. Tricuspid valve regurgitation is trivial.  8. The aortic valve was not well visualized. Aortic valve regurgitation is trivial. No evidence of aortic valve stenosis.  9. The pulmonic valve was not well visualized. Pulmonic valve regurgitation is not visualized. 10. Presence of pericardial fat pad. 11. The inferior vena cava is normal in size with <50% respiratory variability, suggesting right atrial pressure of 8 mmHg. 12. The tricuspid regurgitant velocity is 2.80 m/s, and with an assumed right atrial pressure of 8 mmHg, the estimated right ventricular systolic pressure is mildly elevated at 39.4 mmHg. FINDINGS  Left Ventricle: Left ventricular ejection fraction, by visual estimation, is 60 to 65%. The left ventricle has normal function. The left ventricle has no regional wall motion abnormalities. There is moderately increased left ventricular hypertrophy. Left ventricular diastolic parameters are consistent with Grade II diastolic dysfunction (pseudonormalization). Elevated left atrial pressure. Right Ventricle: The right ventricular size is mildly enlarged. No increase in right ventricular wall thickness. Global RV systolic function is has normal systolic function. The tricuspid regurgitant velocity is 2.80 m/s, and with an assumed right atrial  pressure of 8 mmHg, the estimated right ventricular systolic pressure is mildly elevated at 39.4 mmHg. Left Atrium: Left atrial size was normal in size. Right Atrium: Right atrial size was not well visualized Pericardium: There is no evidence of pericardial effusion. Presence of pericardial fat pad. Mitral Valve: The mitral valve is normal in structure. No  evidence of mitral valve regurgitation. Tricuspid Valve: The tricuspid valve is grossly normal. Tricuspid valve regurgitation is trivial. Aortic Valve: The aortic valve was not well visualized. Aortic valve regurgitation is trivial. The aortic valve is structurally normal, with no evidence of sclerosis or stenosis. Pulmonic Valve: The pulmonic valve was not well visualized. Pulmonic valve regurgitation is not visualized. Pulmonic regurgitation is not visualized. Aorta: The aortic root is normal in size and  structure. Venous: The inferior vena cava is normal in size with less than 50% respiratory variability, suggesting right atrial pressure of 8 mmHg. IAS/Shunts: No atrial level shunt detected by color flow Doppler. Agitated saline contrast was given intravenously to evaluate for intracardiac shunting. Saline contrast bubble study was negative, with no evidence of any interatrial shunt.  TRICUSPID VALVE TR Peak grad:   31.4 mmHg TR Vmax:        280.00 cm/s  Oswaldo Milian MD Electronically signed by Oswaldo Milian MD Signature Date/Time: 08/04/2019/8:49:41 PM    Final    CT Angio Chest/Abd/Pel for Dissection W and/or Wo Contrast  Result Date: 08/01/2019 CLINICAL DATA:  Elevated blood pressure left-sided pain. EXAM: CT ANGIOGRAPHY CHEST, ABDOMEN AND PELVIS TECHNIQUE: Multidetector CT imaging through the chest, abdomen and pelvis was performed using the standard protocol during bolus administration of intravenous contrast. Multiplanar reconstructed images and MIPs were obtained and reviewed to evaluate the vascular anatomy. CONTRAST:  68m OMNIPAQUE IOHEXOL 350 MG/ML SOLN COMPARISON:  Chest, abdomen and pelvis CT, dated January 01, 2017, is available for comparison. FINDINGS: CTA CHEST FINDINGS Cardiovascular: There is stable marked severity calcification and atherosclerosis seen throughout the thoracic aorta. Satisfactory opacification of the pulmonary arteries to the segmental level. No evidence of  pulmonary embolism. Normal heart size. No pericardial effusion. Mediastinum/Nodes: No enlarged mediastinal, hilar, or axillary lymph nodes. A 6 mm cyst is seen within the left lobe of the thyroid gland. Lungs/Pleura: There is mild biapical scarring and/or atelectasis. There is no evidence of a pleural effusion or pneumothorax. Musculoskeletal: Multilevel degenerative changes seen throughout the thoracic spine. Review of the MIP images confirms the above findings. CTA ABDOMEN AND PELVIS FINDINGS VASCULAR Aorta: Marked severity calcification and atherosclerosis without evidence of aneurysmal dilatation or dissection. Celiac: Mild calcification without evidence of aneurysmal dilatation or dissection. SMA: Patent without evidence of aneurysm, dissection, vasculitis or significant stenosis. Renals: Mild calcification without evidence of aneurysmal dilatation or dissection. IMA: Patent without evidence of aneurysm, dissection, vasculitis or significant stenosis. Inflow: Marked severity calcification without evidence of aneurysmal dilatation or dissection Veins: No obvious venous abnormality within the limitations of this arterial phase study. Review of the MIP images confirms the above findings. NON-VASCULAR Hepatobiliary: No focal liver abnormality is seen. No gallstones, gallbladder wall thickening, or biliary dilatation. Pancreas: Unremarkable. No pancreatic ductal dilatation or surrounding inflammatory changes. Spleen: Normal in size without focal abnormality. Adrenals/Urinary Tract: Adrenal glands are unremarkable. Kidneys are normal in size without hydronephrosis. A stable 5 mm non obstructing renal stone is seen within the mid to lower left kidney. A stable 1.0 cm cyst is seen along the posteromedial aspect of the mid right kidney. Bladder is unremarkable. Stomach/Bowel: Stomach is within normal limits. Appendix appears normal. No evidence of bowel wall thickening, distention, or inflammatory changes. Lymphatic: No  enlarged abdominal or pelvic lymph nodes. Reproductive: Status post hysterectomy. No adnexal masses. Other: No abdominal wall hernia or abnormality. No abdominopelvic ascites. Musculoskeletal: Multilevel degenerative changes seen throughout the lumbar spine. This is most prominent at the levels of L4-L5 and L5-S1. Review of the MIP images confirms the above findings. IMPRESSION: 1. No evidence of pulmonary embolus, aortic dissection or other acute vascular pathology. 2. No acute or active cardiopulmonary disease. 3. Marked severity calcification and atherosclerosis involving the thoracic and abdominal aorta which is stable in appearance when compared to the prior study dated January 01, 2017. 4. 5 mm non obstructing renal stone within the mid to lower left kidney. 5. Multilevel degenerative  changes of the thoracic and lumbar spine. Electronically Signed   By: Virgina Norfolk M.D.   On: 08/01/2019 19:40   VAS US CAROTID (at [facility] and [facility] only)  Result Date: 08/05/2019 Carotid Arterial Duplex Study Indications: CVA. Limitations  Today's exam was limited due to the patient's inability or              unwillingness to cooperate. Performing Technologist: Maudry Mayhew MHA, RDMS, RVT, RDCS  Examination Guidelines: A complete evaluation includes B-mode imaging, spectral Doppler, color Doppler, and power Doppler as needed of all accessible portions of each vessel. Bilateral testing is considered an integral part of a complete examination. Limited examinations for reoccurring indications may be performed as noted.  Right Carotid Findings: +----------+-------+-------+--------+---------------------------------+--------+           PSV    EDV    StenosisPlaque Description               Comments           cm/s   cm/s                                                     +----------+-------+-------+--------+---------------------------------+--------+ CCA Prox  82                                                               +----------+-------+-------+--------+---------------------------------+--------+ CCA Distal107    7              smooth and heterogenous                   +----------+-------+-------+--------+---------------------------------+--------+ ICA Prox  169    11             smooth, heterogenous and calcific         +----------+-------+-------+--------+---------------------------------+--------+ ICA Distal84     5                                                        +----------+-------+-------+--------+---------------------------------+--------+ ECA       123                   heterogenous, irregular and                                               calcific                                  +----------+-------+-------+--------+---------------------------------+--------+ +----------+--------+-------+--------+-------------------+           PSV cm/sEDV cmsDescribeArm Pressure (mmHG) +----------+--------+-------+--------+-------------------+ [id number]                                        +----------+--------+-------+--------+-------------------+ +---------+--------+--+--------+---------+  VertebralPSV cm/s20EDV cm/sAntegrade +---------+--------+--+--------+---------+  Left Carotid Findings: +----------+--------+--------+--------+-------------------------+--------------+           PSV cm/sEDV cm/sStenosisPlaque Description       Comments       +----------+--------+--------+--------+-------------------------+--------------+ CCA Prox  92                                                              +----------+--------+--------+--------+-------------------------+--------------+ CCA Distal103                                                             +----------+--------+--------+--------+-------------------------+--------------+ ICA Prox  116                     heterogenous and                                                           irregular                               +----------+--------+--------+--------+-------------------------+--------------+ ICA Distal98      9                                                       +----------+--------+--------+--------+-------------------------+--------------+ ECA                                                        Not visualized +----------+--------+--------+--------+-------------------------+--------------+ +----------+--------+--------+---------+-------------------+           PSV cm/sEDV cm/sDescribe Arm Pressure (mmHG) +----------+--------+--------+---------+-------------------+ [id number]             Turbulent                    +----------+--------+--------+---------+-------------------+ +---------+--------+--+--------+--+---------+ VertebralPSV cm/s52EDV cm/s10Antegrade +---------+--------+--+--------+--+---------+   Summary: Right Carotid: Velocities in the right ICA are consistent with a 1-39% stenosis. Left Carotid: Velocities in the left ICA are consistent with a 1-39% stenosis. Vertebrals:  Bilateral vertebral arteries demonstrate antegrade flow. Subclavians: Left subclavian artery flow was disturbed. Normal flow hemodynamics              were seen in the right subclavian artery. *See table(s) above for measurements and observations.  Electronically signed by Antony Contras MD on 08/05/2019 at 11:01:19 AM.    Final     Labs: BNP (last 3 results) No results for input(s): BNP in the last 8760 hours. Basic Metabolic Panel: Recent Labs  Lab 08/04/19 0415 08/05/19 0454 08/06/19 0527 08/07/19 0817  NA 137 136 135 134*  K 3.9 3.4* 4.0 4.0  CL 105  97* 101 101  CO2 '23 23 27 25  ' GLUCOSE 109* 132* 120* 128*  BUN 29* 23 29* 26*  CREATININE 1.68* 1.34* 1.59* 1.38*  CALCIUM 8.9 9.0 9.3 9.0  MG 2.0 2.1  --   --   PHOS 4.4 4.1  --   --    Liver Function Tests: Recent Labs  Lab 08/06/19 0527  AST 13*  ALT 14  ALKPHOS 44  BILITOT  0.6  PROT 6.0*  ALBUMIN 2.8*   No results for input(s): LIPASE, AMYLASE in the last 168 hours. No results for input(s): AMMONIA in the last 168 hours. CBC: Recent Labs  Lab 08/04/19 0415 08/05/19 0454 08/07/19 0817  WBC 5.7 7.5 7.6  HGB 10.2* 11.3* 9.9*  HCT 30.9* 34.1* 30.3*  MCV 97.8 96.3 97.1  PLT 329 350 304   Cardiac Enzymes: No results for input(s): CKTOTAL, CKMB, CKMBINDEX, TROPONINI in the last 168 hours. BNP: Invalid input(s): POCBNP CBG: Recent Labs  Lab 08/09/19 1113 08/09/19 1153 08/09/19 1626 08/09/19 2125 08/10/19 0749  GLUCAP 158* 128* 137* 114* 122*   D-Dimer No results for input(s): DDIMER in the last 72 hours. Hgb A1c No results for input(s): HGBA1C in the last 72 hours. Lipid Profile No results for input(s): CHOL, HDL, LDLCALC, TRIG, CHOLHDL, LDLDIRECT in the last 72 hours. Thyroid function studies No results for input(s): TSH, T4TOTAL, T3FREE, THYROIDAB in the last 72 hours.  Invalid input(s): FREET3 Anemia work up No results for input(s): VITAMINB12, FOLATE, FERRITIN, TIBC, IRON, RETICCTPCT in the last 72 hours. Urinalysis    Component Value Date/Time   COLORURINE YELLOW 08/01/2019 2007   APPEARANCEUR CLEAR 08/01/2019 2007   LABSPEC 1.030 08/01/2019 2007   PHURINE 6.0 08/01/2019 2007   GLUCOSEU NEGATIVE 08/01/2019 2007   HGBUR NEGATIVE 08/01/2019 2007   [place] NEGATIVE 08/01/2019 2007   [place]ief NEGATIVE 08/01/2019 2007   PROTEINUR >=300 (A) 08/01/2019 2007   NITRITE NEGATIVE 08/01/2019 2007   LEUKOCYTESUR MODERATE (A) 08/01/2019 2007   Sepsis Labs Invalid input(s): PROCALCITONIN,  WBC,  LACTICIDVEN Microbiology Recent Results (from the past 240 hour(s))  Culture, Urine     Status: Abnormal   Collection Time: 08/01/19  8:07 PM   Specimen: Urine, Clean Catch  Result Value Ref Range Status   Specimen Description   Final    URINE, CLEAN CATCH Performed at [facility], [place] [address], [place], [place]  [address]    Special Requests   Final    NONE Performed at [facility], [place] [address] [place], [place] [address]    Culture (A)  Final    <10,000 COLONIES/mL INSIGNIFICANT GROWTH Performed at [place] [address] [place], [place] [address]    Report Status 08/03/2019 FINAL  Final  Respiratory Panel by RT PCR (Flu A&B, Covid) - Nasopharyngeal Swab     Status: None   Collection Time: 08/02/19 12:26 AM   Specimen: Nasopharyngeal Swab  Result Value Ref Range Status   SARS Coronavirus 2 by RT PCR NEGATIVE NEGATIVE Final    Comment: (NOTE) SARS-CoV-2 target nucleic acids are NOT DETECTED. The SARS-CoV-2 RNA is generally detectable in upper respiratoy specimens during the acute phase of infection. The lowest concentration of SARS-CoV-2 viral copies this assay can detect is 131 copies/mL. A negative result does not preclude SARS-Cov-2 infection and should not be used as the sole basis for treatment or other patient management decisions. A negative result may occur with  improper specimen collection/handling, submission  of specimen other than nasopharyngeal swab, presence of viral mutation(s) within the areas targeted by this assay, and inadequate number of viral copies (<131 copies/mL). A negative result must be combined with clinical observations, patient history, and epidemiological information. The expected result is Negative. Fact Sheet for Patients:  PinkCheek.be Fact Sheet for Healthcare Providers:  GravelBags.it This test is not yet ap proved or cleared by the [place] FDA and  has been authorized for detection and/or diagnosis of SARS-CoV-2 by FDA under an Emergency Use Authorization (EUA). This EUA will remain  in effect (meaning this test can be used) for the duration of the COVID-19 declaration under Section 564(b)(1) of the Act, 21 U.S.C. section 360bbb-3(b)(1), unless the  authorization is terminated or revoked sooner.    Influenza A by PCR NEGATIVE NEGATIVE Final   Influenza B by PCR NEGATIVE NEGATIVE Final    Comment: (NOTE) The Xpert Xpress SARS-CoV-2/FLU/RSV assay is intended as an aid in  the diagnosis of influenza from Nasopharyngeal swab specimens and  should not be used as a sole basis for treatment. Nasal washings and  aspirates are unacceptable for Xpert Xpress SARS-CoV-2/FLU/RSV  testing. Fact Sheet for Patients: PinkCheek.be Fact Sheet for Healthcare Providers: GravelBags.it This test is not yet approved or cleared by the [place] FDA and  has been authorized for detection and/or diagnosis of SARS-CoV-2 by  FDA under an Emergency Use Authorization (EUA). This EUA will remain  in effect (meaning this test can be used) for the duration of the  Covid-19 declaration under Section 564(b)(1) of the Act, 21  U.S.C. section 360bbb-3(b)(1), unless the authorization is  terminated or revoked. Performed at [facility], [place] [address] [place], [place] [address]   Culture, blood (Routine X 2) w Reflex to ID Panel     Status: None (Preliminary result)   Collection Time: 08/02/19  1:40 AM   Specimen: BLOOD  Result Value Ref Range Status   Specimen Description   Final    BLOOD LEFT ANTECUBITAL Performed at [place] [address], [place], [place] [address]    Special Requests   Final    BOTTLES DRAWN AEROBIC AND ANAEROBIC Blood Culture adequate volume Performed at [place] [address] [place], [place] [address]    Culture   Final    NO GROWTH 4 DAYS Performed at [facility], [place] [address] [place], [place] [address]    Report Status PENDING  Incomplete  Culture, blood (Routine X 2) w Reflex to ID Panel     Status: None (Preliminary result)   Collection Time: 08/02/19  1:45 AM   Specimen: BLOOD  Result  Value Ref Range Status   Specimen Description   Final    BLOOD LEFT ANTECUBITAL Performed at [place] [address] [place], [place] [address]    Special Requests   Final    BOTTLES DRAWN AEROBIC AND ANAEROBIC Blood Culture adequate volume Performed at [place] [address] [place], [place] [address]    Culture   Final    NO GROWTH 4 DAYS Performed at [facility], [place] [address] [place],  [address]    Report Status PENDING  Incomplete  MRSA PCR Screening     Status: None   Collection Time: 08/03/19  5:35 PM   Specimen: Nasal Mucosa; Nasopharyngeal  Result Value Ref Range Status   MRSA by PCR NEGATIVE NEGATIVE Final  Comment:        The GeneXpert MRSA Assay (FDA approved for NASAL specimens only), is one component of a comprehensive MRSA colonization surveillance program. It is not intended to diagnose MRSA infection nor to guide or monitor treatment for MRSA infections. Performed at [facility], [place] [address], [place], [place] [address]   SARS CORONAVIRUS 2 (TAT 6-24 HRS) Nasopharyngeal Nasopharyngeal Swab     Status: None   Collection Time: 08/09/19  2:31 PM   Specimen: Nasopharyngeal Swab  Result Value Ref Range Status   SARS Coronavirus 2 NEGATIVE NEGATIVE Final    Comment: (NOTE) SARS-CoV-2 target nucleic acids are NOT DETECTED. The SARS-CoV-2 RNA is generally detectable in upper and lower respiratory specimens during the acute phase of infection. Negative results do not preclude SARS-CoV-2 infection, do not rule out co-infections with other pathogens, and should not be used as the sole basis for treatment or other patient management decisions. Negative results must be combined with clinical observations, patient history, and epidemiological information. The expected result is Negative. Fact Sheet for Patients: SugarRoll.be Fact Sheet for  Healthcare Providers: https://www.woods-mathews.com/ This test is not yet approved or cleared by the [place] FDA and  has been authorized for detection and/or diagnosis of SARS-CoV-2 by FDA under an Emergency Use Authorization (EUA). This EUA will remain  in effect (meaning this test can be used) for the duration of the COVID-19 declaration under Section 56 4(b)(1) of the Act, 21 U.S.C. section 360bbb-3(b)(1), unless the authorization is terminated or revoked sooner. Performed at [facility], [place] [address], [place], [place] [address]      Time coordinating discharge: 35 minutes  SIGNED: Antonieta Pert, MD  [facility] 08/10/2019, 9:30 AM  If 7PM-7AM, please contact night-coverage [email or web address]

## 2019-08-10 NOTE — Progress Notes (Signed)
Occupational Therapy Treatment Patient Details Name: Chloe Fletcher MRN: [id number] DOB: [birth year] Today's Date: 08/10/2019    History of present illness 79 yo female admitted with hypertensive urgency. MRI + acute CVA. Hx of dementia, DM, CKD, COPD   OT comments  Pt making slow progress toward a supervision level of care so she can eventually return to her ALF. Overall, pt functions at a min guard level of care.  Pt can do adls safely in sitting but when on her feet, pt requires close min guard.  Pt did not have loss of balance this morning while ambulating in her room and doing adls but is easily distracted in standing and a fall risk at times. Do feel pt is becoming safer on her feet during adls.     Follow Up Recommendations  SNF;Supervision/Assistance - 24 hour    Equipment Recommendations  None recommended by OT    Recommendations for Other Services      Precautions / Restrictions Precautions Precautions: Fall Restrictions Weight Bearing Restrictions: No       Mobility Bed Mobility Overal bed mobility: Needs Assistance Bed Mobility: Supine to Sit     Supine to sit: Supervision Sit to supine: Supervision   General bed mobility comments: no hands on assist needed but if bed flat she requires cues to use rails instead of waiting for therapist to assist.  Transfers Overall transfer level: Needs assistance Equipment used: 1 person hand held assist Transfers: Sit to/from Stand Sit to Stand: Min guard Stand pivot transfers: Min guard       General transfer comment: close guard for safety.    Balance Overall balance assessment: Needs assistance Sitting-balance support: No upper extremity supported Sitting balance-Leahy Scale: Good     Standing balance support: During functional activity Standing balance-Leahy Scale: Fair Standing balance comment: Pt can stand unassisted and walk at times without a device.  Pt does fine until distracted and then occasionally needs  min assist to maintain balance.                           ADL either performed or assessed with clinical judgement   ADL Overall ADL's : Needs assistance/impaired Eating/Feeding: Set up;Sitting   Grooming: Wash/dry hands;Wash/dry face;Oral care;Supervision/safety;Cueing for sequencing;Standing Grooming Details (indicate cue type and reason): cues to turn water off. Upper Body Bathing: Supervision/ safety   Lower Body Bathing: Min guard;Sit to/from stand Lower Body Bathing Details (indicate cue type and reason): min guard needed when on feet         Toilet Transfer: Min guard;Ambulation;Comfort height toilet;Grab bars Toilet Transfer Details (indicate cue type and reason): Pt walked to bathroom and toileted with min guard.  Therapist was close but never touched pt while toileting and cleaning herself. Toileting- Clothing Manipulation and Hygiene: Min guard;Sitting/lateral lean Toileting - Clothing Manipulation Details (indicate cue type and reason): Pt with increased safety today while toileting.     Functional mobility during ADLs: Cueing for safety;Cueing for sequencing;Minimal assistance General ADL Comments: Pt currently at min guard level for most adls.  Min assist occasionally when on her feet for safety.     Vision   Vision Assessment?: No apparent visual deficits   Perception     Praxis      Cognition Arousal/Alertness: Awake/alert Behavior During Therapy: WFL for tasks assessed/performed Overall Cognitive Status: History of cognitive impairments - at baseline Area of Impairment: Orientation;Attention;Memory;Safety/judgement;Awareness;Problem solving  Orientation Level: Disoriented to;Place;Time;Situation Current Attention Level: Focused Memory: Decreased short-term memory;Decreased recall of precautions Following Commands: Follows one step commands with increased time Safety/Judgement: Decreased awareness of safety;Decreased  awareness of deficits Awareness: Intellectual Problem Solving: Slow processing;Requires verbal cues General Comments: Pt very distracted and fixated on her hearing aids this am.        Exercises     Shoulder Instructions       General Comments Pt moving around well.  Cognition limits patient's safety.  Pt is easily distracted during all adls and this becomes more of a safety issue when she is on her feet.     Pertinent Vitals/ Pain       Pain Assessment: No/denies pain  Home Living                                          Prior Functioning/Environment              Frequency  Min 2X/week        Progress Toward Goals  OT Goals(current goals can now be found in the care plan section)  Progress towards OT goals: Progressing toward goals  Acute Rehab OT Goals Patient Stated Goal: unable OT Goal Formulation: Patient unable to participate in goal setting Time For Goal Achievement: 08/18/19 Potential to Achieve Goals: Good ADL Goals Pt Will Perform Upper Body Dressing: with modified independence;sitting Pt Will Perform Lower Body Dressing: with modified independence;sitting/lateral leans;sit to/from stand Pt Will Transfer to Toilet: with supervision;ambulating;regular height toilet Pt Will Perform Toileting - Clothing Manipulation and hygiene: with supervision;sit to/from stand;sitting/lateral leans Additional ADL Goal #1: Patient will complete basic self care tasks such as grooming/hygiene with less than 25% multimodal cues for problem solving, attention, sequencing.  Plan Discharge plan remains appropriate    Co-evaluation                 AM-PAC OT "6 Clicks" Daily Activity     Outcome Measure   Help from another person eating meals?: A Little Help from another person taking care of personal grooming?: A Little Help from another person toileting, which includes using toliet, bedpan, or urinal?: A Little Help from another person bathing  (including washing, rinsing, drying)?: A Little Help from another person to put on and taking off regular upper body clothing?: A Little Help from another person to put on and taking off regular lower body clothing?: A Little 6 Click Score: 18    End of Session    OT Visit Diagnosis: Unsteadiness on feet (R26.81);Muscle weakness (generalized) (M62.81);Other symptoms and signs involving cognitive function   Activity Tolerance Patient tolerated treatment well   Patient Left with call bell/phone within reach;in bed;with bed alarm set   Nurse Communication Mobility status        Time: [id number] OT Time Calculation (min): 28 min  Charges: OT General Charges $OT Visit: 1 Visit OT Treatments $Self Care/Home Management : 23-37 mins   Glenford Peers 08/10/2019, 10:20 AM

## 2019-08-10 NOTE — TOC Transition Note (Signed)
Transition of Care [facility]) - CM/SW Discharge Note   Patient Details  Name: Chloe Fletcher MRN: [id number] Date of Birth: [birth year]  [facility] CM/SW Contact:  Shade Flood, LCSW Phone Number: 08/10/2019, 10:06 AM   Clinical Narrative:     Pt stable for dc today per MD. Damaris Schooner with Juliann Pulse at [facility] to update and was informed that they are prepared for pt to admit this AM. DC clinical sent electronically.  Updated pt's daughter, Mickel Baas, by phone. Confirmed for Mickel Baas that they can coordinate with [facility] to arrange a time for family to drop off pt's things and that it doesn't have to be at the exact time pt arrives there. Mickel Baas is agreeable for pt to dc this AM.  Updated pt's RN. Phone number for report is [phone]. Pt going to Room 119B.  EMS forms complete and will be brought to the floor. Will arrange PTAR for transport.  There are no other TOC needs for dc.  Final next level of care: Skilled Nursing Facility Barriers to Discharge: Barriers Resolved   Patient Goals and CMS Choice Patient states their goals for this hospitalization and ongoing recovery are:: go to SNF CMS Medicare.gov Compare Post Acute Care list provided to:: Patient Represenative (must comment) Choice offered to / list presented to : Adult Children  Discharge Placement PASRR number recieved: 08/09/19            Patient chooses bed at: [facility] Patient to be transferred to facility by: [place] Name of family member notified: Mickel Baas (dtr) Patient and family notified of of transfer: 08/10/19  Discharge Plan and Services   Discharge Planning Services: CM Consult                                 Social Determinants of Health ([place]) Interventions     Readmission Risk Interventions Readmission Risk Prevention Plan 08/10/2019  Transportation Screening Complete  PCP or Specialist Appt within 5-7 Days Not Complete  Not Complete comments Pt going to  SNF  Home Care Screening Not Complete  Home Care Screening Not Completed Comments Pt going to SNF  Medication Review (RN CM) Complete

## 2019-08-11 LAB — CULTURE, BLOOD (ROUTINE X 2)
Culture: NO GROWTH
Culture: NO GROWTH
Special Requests: ADEQUATE
Special Requests: ADEQUATE

## 2019-09-25 ENCOUNTER — Observation Stay (HOSPITAL_COMMUNITY): Payer: Medicare Other

## 2019-09-25 ENCOUNTER — Inpatient Hospital Stay (HOSPITAL_COMMUNITY)
Admission: EM | Admit: 2019-09-25 | Discharge: 2019-09-28 | DRG: 811 | Disposition: A | Discharge status: Home / Self Care | Payer: Medicare Other | Source: Skilled Nursing Facility | Attending: Internal Medicine | Admitting: Internal Medicine

## 2019-09-25 ENCOUNTER — Other Ambulatory Visit: Payer: Self-pay

## 2019-09-25 DIAGNOSIS — T424X5A Adverse effect of benzodiazepines, initial encounter: Secondary | ICD-10-CM | POA: Diagnosis present

## 2019-09-25 DIAGNOSIS — Z7951 Long term (current) use of inhaled steroids: Secondary | ICD-10-CM

## 2019-09-25 DIAGNOSIS — E876 Hypokalemia: Secondary | ICD-10-CM | POA: Diagnosis present

## 2019-09-25 DIAGNOSIS — Z7902 Long term (current) use of antithrombotics/antiplatelets: Secondary | ICD-10-CM

## 2019-09-25 DIAGNOSIS — Z20822 Contact with and (suspected) exposure to covid-19: Secondary | ICD-10-CM | POA: Diagnosis present

## 2019-09-25 DIAGNOSIS — R410 Disorientation, unspecified: Secondary | ICD-10-CM

## 2019-09-25 DIAGNOSIS — G92 Toxic encephalopathy: Secondary | ICD-10-CM | POA: Diagnosis present

## 2019-09-25 DIAGNOSIS — F17200 Nicotine dependence, unspecified, uncomplicated: Secondary | ICD-10-CM | POA: Diagnosis present

## 2019-09-25 DIAGNOSIS — Z7984 Long term (current) use of oral hypoglycemic drugs: Secondary | ICD-10-CM

## 2019-09-25 DIAGNOSIS — D649 Anemia, unspecified: Principal | ICD-10-CM | POA: Diagnosis present

## 2019-09-25 DIAGNOSIS — G9341 Metabolic encephalopathy: Secondary | ICD-10-CM

## 2019-09-25 DIAGNOSIS — R17 Unspecified jaundice: Secondary | ICD-10-CM | POA: Diagnosis present

## 2019-09-25 DIAGNOSIS — I129 Hypertensive chronic kidney disease with stage 1 through stage 4 chronic kidney disease, or unspecified chronic kidney disease: Secondary | ICD-10-CM | POA: Diagnosis present

## 2019-09-25 DIAGNOSIS — F0150 Vascular dementia without behavioral disturbance: Secondary | ICD-10-CM | POA: Diagnosis present

## 2019-09-25 DIAGNOSIS — J449 Chronic obstructive pulmonary disease, unspecified: Secondary | ICD-10-CM | POA: Diagnosis present

## 2019-09-25 DIAGNOSIS — Z8673 Personal history of transient ischemic attack (TIA), and cerebral infarction without residual deficits: Secondary | ICD-10-CM

## 2019-09-25 DIAGNOSIS — I10 Essential (primary) hypertension: Secondary | ICD-10-CM

## 2019-09-25 DIAGNOSIS — Z66 Do not resuscitate: Secondary | ICD-10-CM | POA: Diagnosis present

## 2019-09-25 DIAGNOSIS — T450X5A Adverse effect of antiallergic and antiemetic drugs, initial encounter: Secondary | ICD-10-CM | POA: Diagnosis present

## 2019-09-25 DIAGNOSIS — E1122 Type 2 diabetes mellitus with diabetic chronic kidney disease: Secondary | ICD-10-CM | POA: Diagnosis present

## 2019-09-25 DIAGNOSIS — Y92099 Unspecified place in other non-institutional residence as the place of occurrence of the external cause: Secondary | ICD-10-CM

## 2019-09-25 DIAGNOSIS — N1832 Chronic kidney disease, stage 3b: Secondary | ICD-10-CM | POA: Diagnosis present

## 2019-09-25 DIAGNOSIS — N179 Acute kidney failure, unspecified: Secondary | ICD-10-CM

## 2019-09-25 DIAGNOSIS — Z79899 Other long term (current) drug therapy: Secondary | ICD-10-CM

## 2019-09-25 LAB — BASIC METABOLIC PANEL
Anion gap: 8 (ref 5–15)
BUN: 13 mg/dL (ref 8–23)
CO2: 26 mmol/L (ref 22–32)
Calcium: 8.9 mg/dL (ref 8.9–10.3)
Chloride: 108 mmol/L (ref 98–111)
Creatinine, Ser: 1.52 mg/dL — ABNORMAL HIGH (ref 0.44–1.00)
GFR calc Af Amer: 38 mL/min — ABNORMAL LOW (ref >60)
GFR calc non Af Amer: 32 mL/min — ABNORMAL LOW (ref >60)
Glucose, Bld: 97 mg/dL (ref 70–99)
Potassium: 3.4 mmol/L — ABNORMAL LOW (ref 3.5–5.1)
Sodium: 142 mmol/L (ref 135–145)

## 2019-09-25 LAB — CBC WITH DIFFERENTIAL/PLATELET
Abs Immature Granulocytes: 0.02 10*3/uL (ref 0.00–0.07)
Basophils Absolute: 0 10*3/uL (ref 0.0–0.1)
Basophils Relative: 0 %
Eosinophils Absolute: 0.2 10*3/uL (ref 0.0–0.5)
Eosinophils Relative: 3 %
HCT: 24.1 % — ABNORMAL LOW (ref 36.0–46.0)
Hemoglobin: 7.7 g/dL — ABNORMAL LOW (ref 12.0–15.0)
Immature Granulocytes: 0 %
Lymphocytes Relative: 17 %
Lymphs Abs: 1 10*3/uL (ref 0.7–4.0)
MCH: 30.2 pg (ref 26.0–34.0)
MCHC: 32 g/dL (ref 30.0–36.0)
MCV: 94.5 fL (ref 80.0–100.0)
Monocytes Absolute: 0.8 10*3/uL (ref 0.1–1.0)
Monocytes Relative: 14 %
Neutro Abs: 3.9 10*3/uL (ref 1.7–7.7)
Neutrophils Relative %: 66 %
Platelets: 372 10*3/uL (ref 150–400)
RBC: 2.55 MIL/uL — ABNORMAL LOW (ref 3.87–5.11)
RDW: 14.3 % (ref 11.5–15.5)
WBC: 6 10*3/uL (ref 4.0–10.5)
nRBC: 0 % (ref 0.0–0.2)

## 2019-09-25 LAB — RETICULOCYTES
Immature Retic Fract: 10.2 % (ref 2.3–15.9)
RBC.: 2.5 MIL/uL — ABNORMAL LOW (ref 3.87–5.11)
Retic Count, Absolute: 35.8 10*3/uL (ref 19.0–186.0)
Retic Ct Pct: 1.4 % (ref 0.4–3.1)

## 2019-09-25 LAB — IRON AND TIBC
Iron: 38 ug/dL (ref 28–170)
Saturation Ratios: 12 % (ref 10.4–31.8)
TIBC: 308 ug/dL (ref 250–450)
UIBC: 270 ug/dL

## 2019-09-25 LAB — POC OCCULT BLOOD, ED: Fecal Occult Bld: NEGATIVE

## 2019-09-25 LAB — AMMONIA: Ammonia: 17 umol/L (ref 9–35)

## 2019-09-25 LAB — ABO/RH: ABO/RH(D): A NEG

## 2019-09-25 LAB — TSH: TSH: 0.804 u[IU]/mL (ref 0.350–4.500)

## 2019-09-25 LAB — VITAMIN B12: Vitamin B-12: 335 pg/mL (ref 180–914)

## 2019-09-25 LAB — PREPARE RBC (CROSSMATCH)

## 2019-09-25 LAB — FERRITIN: Ferritin: 25 ng/mL (ref 11–307)

## 2019-09-25 LAB — GLUCOSE, CAPILLARY: Glucose-Capillary: 107 mg/dL — ABNORMAL HIGH (ref 70–99)

## 2019-09-25 LAB — FOLATE: Folate: 9.5 ng/mL (ref >5.9)

## 2019-09-25 IMAGING — MR MR HEAD W/O CM
9 series · 47 of 48 positions shown · non-contrast
Comparison: Brain MRI [DATE]

CLINICAL DATA: Delirium

EXAM:
MRI HEAD WITHOUT CONTRAST
TECHNIQUE: Multiplanar, multiecho pulse sequences of the brain and surrounding
structures were obtained without intravenous contrast.

[Series 5: T2 · sagittal · 5.0mm · 0.47mm/px · 2 of 24 slices shown (1 of 2)]
[im 1/24]
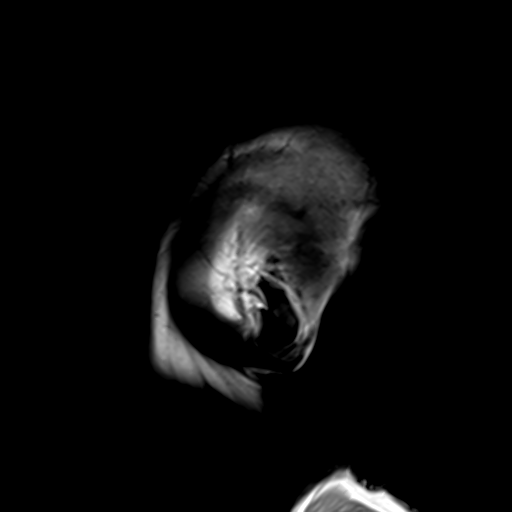
[im 24/24]
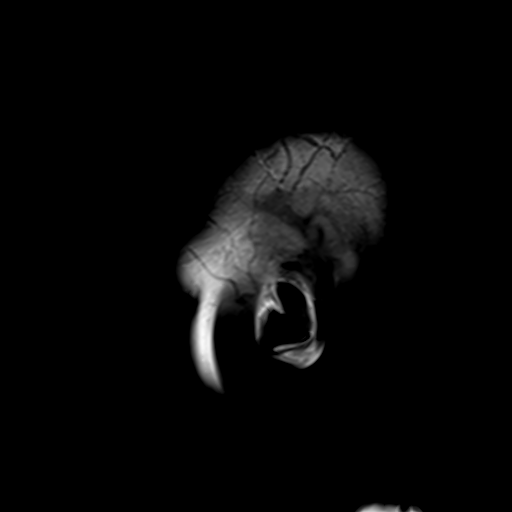

[Series 6: dwi_tracew · axial · 3.0mm · 1.08mm/px · z∈[-36,+94]mm · 10 of 90 slices shown]
[im 1/90]
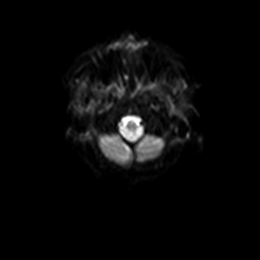
[im 9/90]
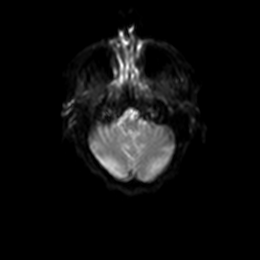
[im 18/90]
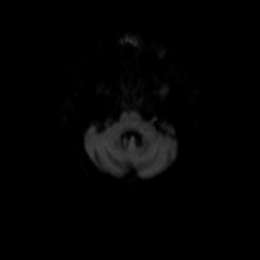
[im 27/90]
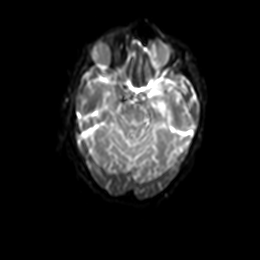
[im 36/90]
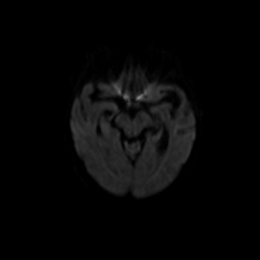
[im 45/90]
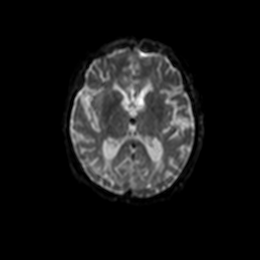
[im 54/90]
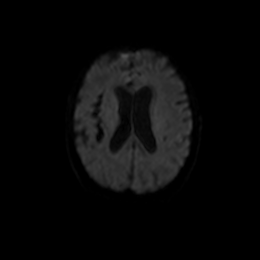
[im 63/90]
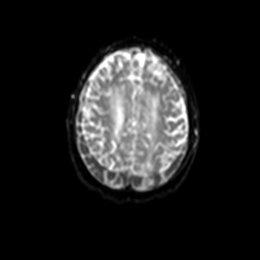
[im 72/90]
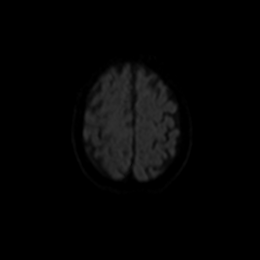
[im 90/90]
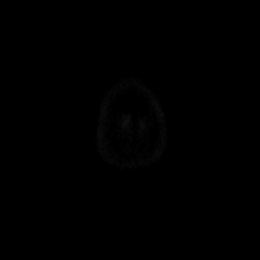

[Series 7: dwi_adc · axial · 3.0mm · 1.08mm/px · z∈[-36,+94]mm · 6 of 45 slices shown]
[im 1/45]
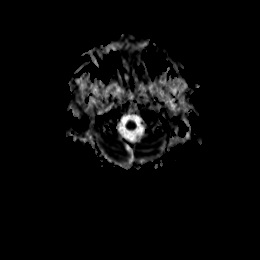
[im 9/45]
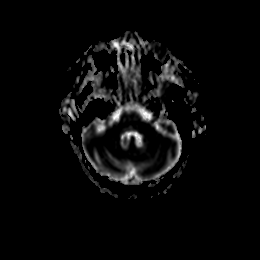
[im 18/45]
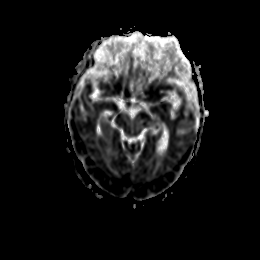
[im 27/45]
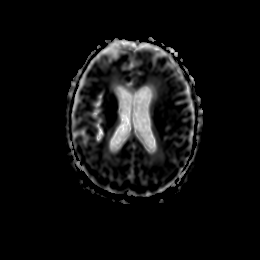
[im 36/45]
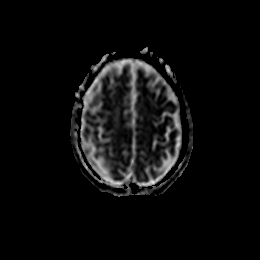
[im 45/45]
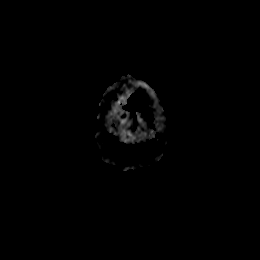

[Series 8: DWI · coronal · 5.0mm · 1.31mm/px · 8 of 64 slices shown (1 of 2)]
[im 1/64]
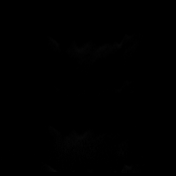
[im 10/64]
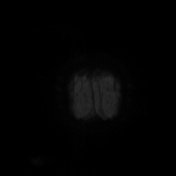
[im 19/64]
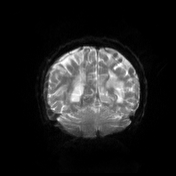
[im 28/64]
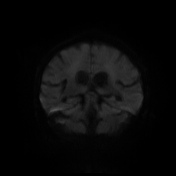
[im 37/64]
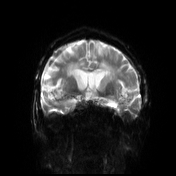
[im 46/64]
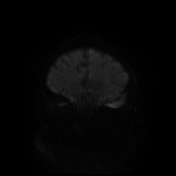
[im 55/64]
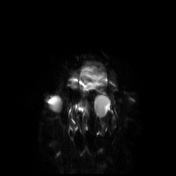
[im 64/64]
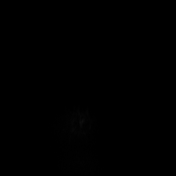

[Series 9: DWI · coronal · 5.0mm · 1.31mm/px · 4 of 30 slices shown (2 of 2)]
[im 1/30]
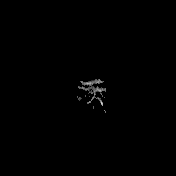
[im 10/30]
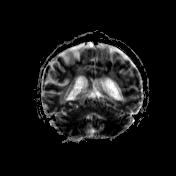
[im 20/30]
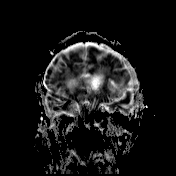
[im 30/30]
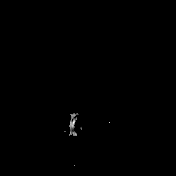

[Series 10: T2 · axial · 5.0mm · 0.45mm/px · z∈[-48,+105]mm · 3 of 25 slices shown (2 of 2)]
[im 1/25]
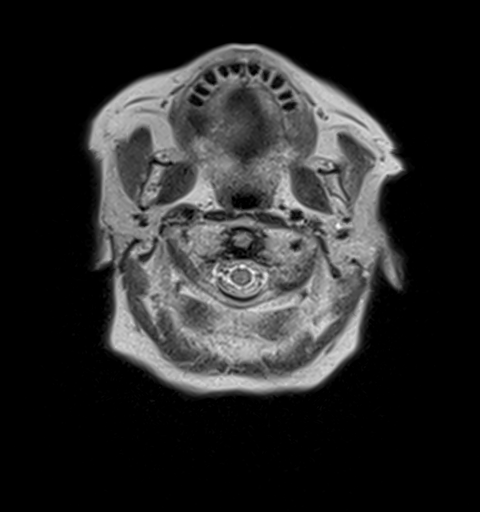
[im 13/25]
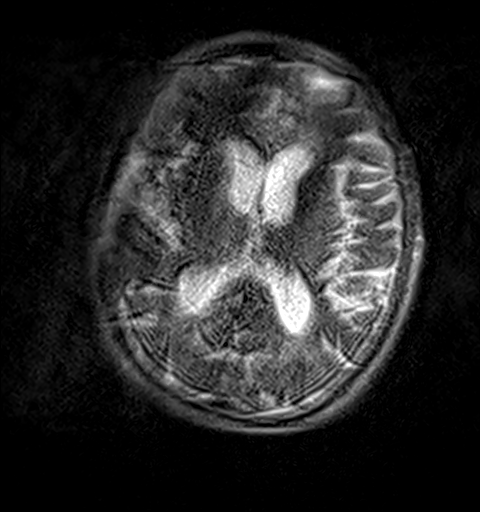
[im 25/25]
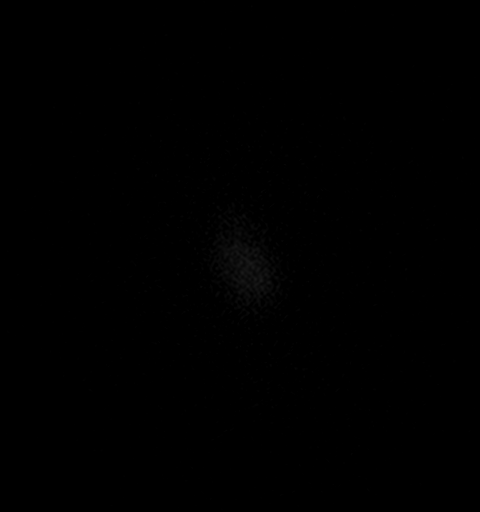

[Series 11: GRE · axial · 3.0mm · 0.45mm/px · z∈[-39,+99]mm · 6 of 48 slices shown]
[im 1/48]
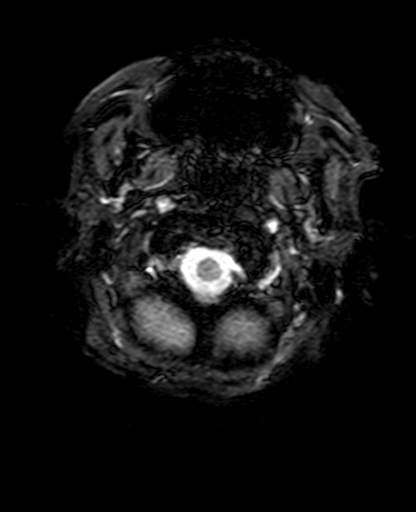
[im 10/48]
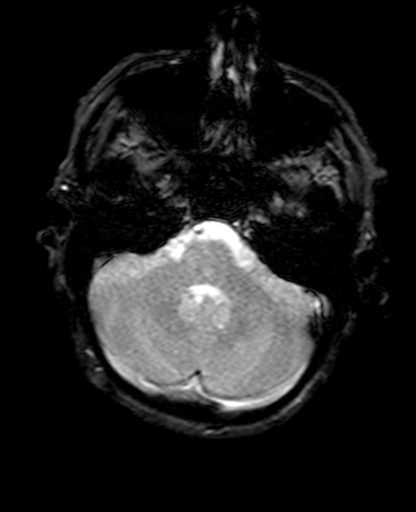
[im 19/48]
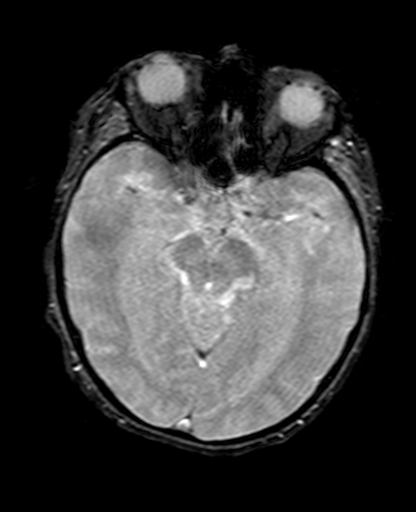
[im 29/48]
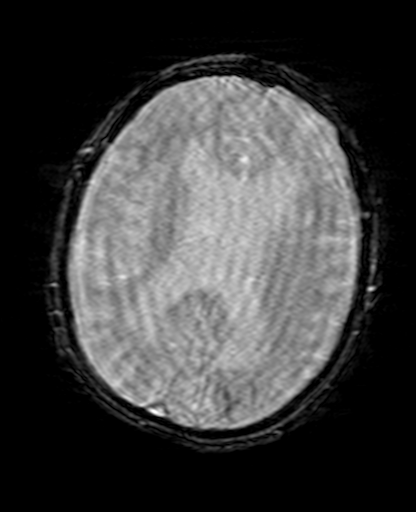
[im 38/48]
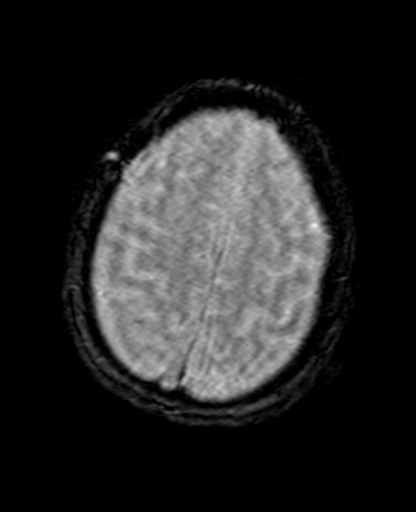
[im 48/48]
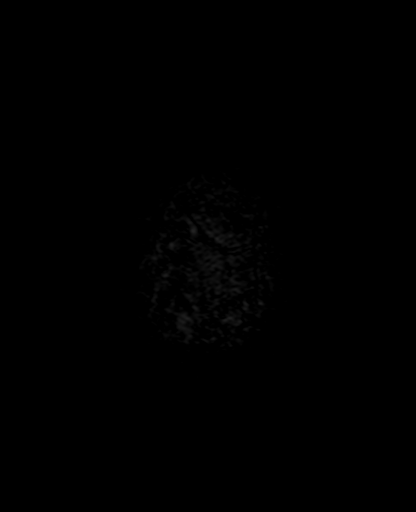

[Series 12: FLAIR · axial · 3.0mm · 0.86mm/px · z∈[-38,+100]mm · 6 of 48 slices shown]
[im 1/48]
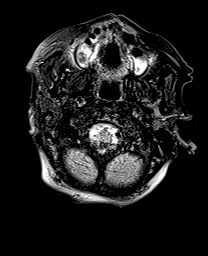
[im 10/48]
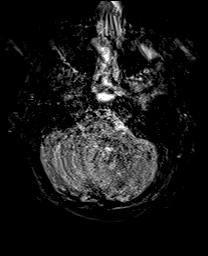
[im 19/48]
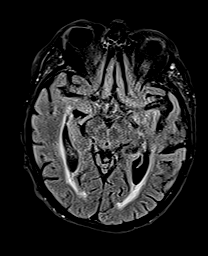
[im 29/48]
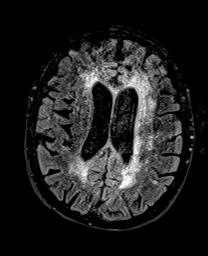
[im 38/48]
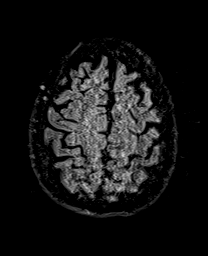
[im 48/48]
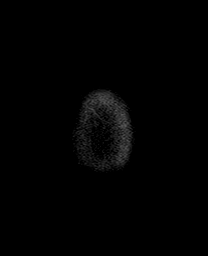

[Series 13: T1 · axial · 3.0mm · 0.45mm/px · z∈[-35,+89]mm · 2 of 15 slices shown]
[im 1/15]
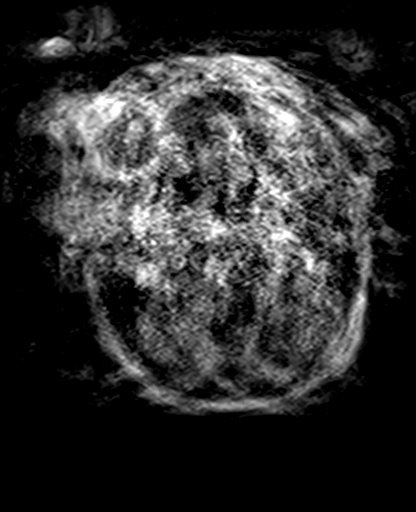
[im 15/15]
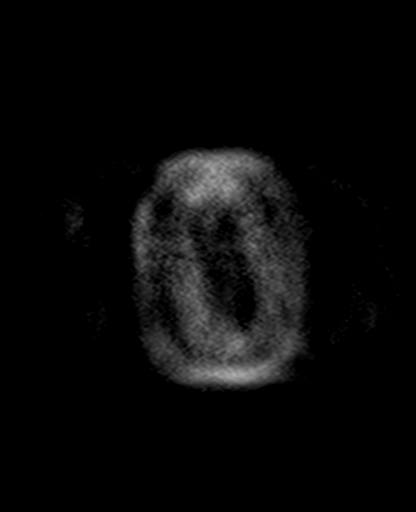

[47 of 48 positions shown; findings below may reference images not displayed]

FINDINGS: Examination is severely degraded by motion.

Brain: No acute infarct, acute hemorrhage or extra-axial collection.
Diffuse confluent hyperintense T2-weighted signal within the
periventricular, deep and juxtacortical white matter, most commonly
due to chronic ischemic microangiopathy. There is generalized
atrophy without lobar predilection. No chronic microhemorrhage.
Normal midline structures.

Vascular: Normal flow voids.

Skull and upper cervical spine: Normal marrow signal.

Sinuses/Orbits: Negative.

Other: None.
IMPRESSION: 1. Severely motion degraded study.
2. No acute intracranial abnormality.
3. Advanced sequelae of chronic ischemic microangiopathy and
generalized atrophy.

## 2019-09-25 IMAGING — CR DG CHEST 2V
2 series · 2 of 2 positions shown · non-contrast
Comparison: CT [DATE]

CLINICAL DATA: Low hemoglobin

EXAM:
CHEST - 2 VIEW

[w chest lat]
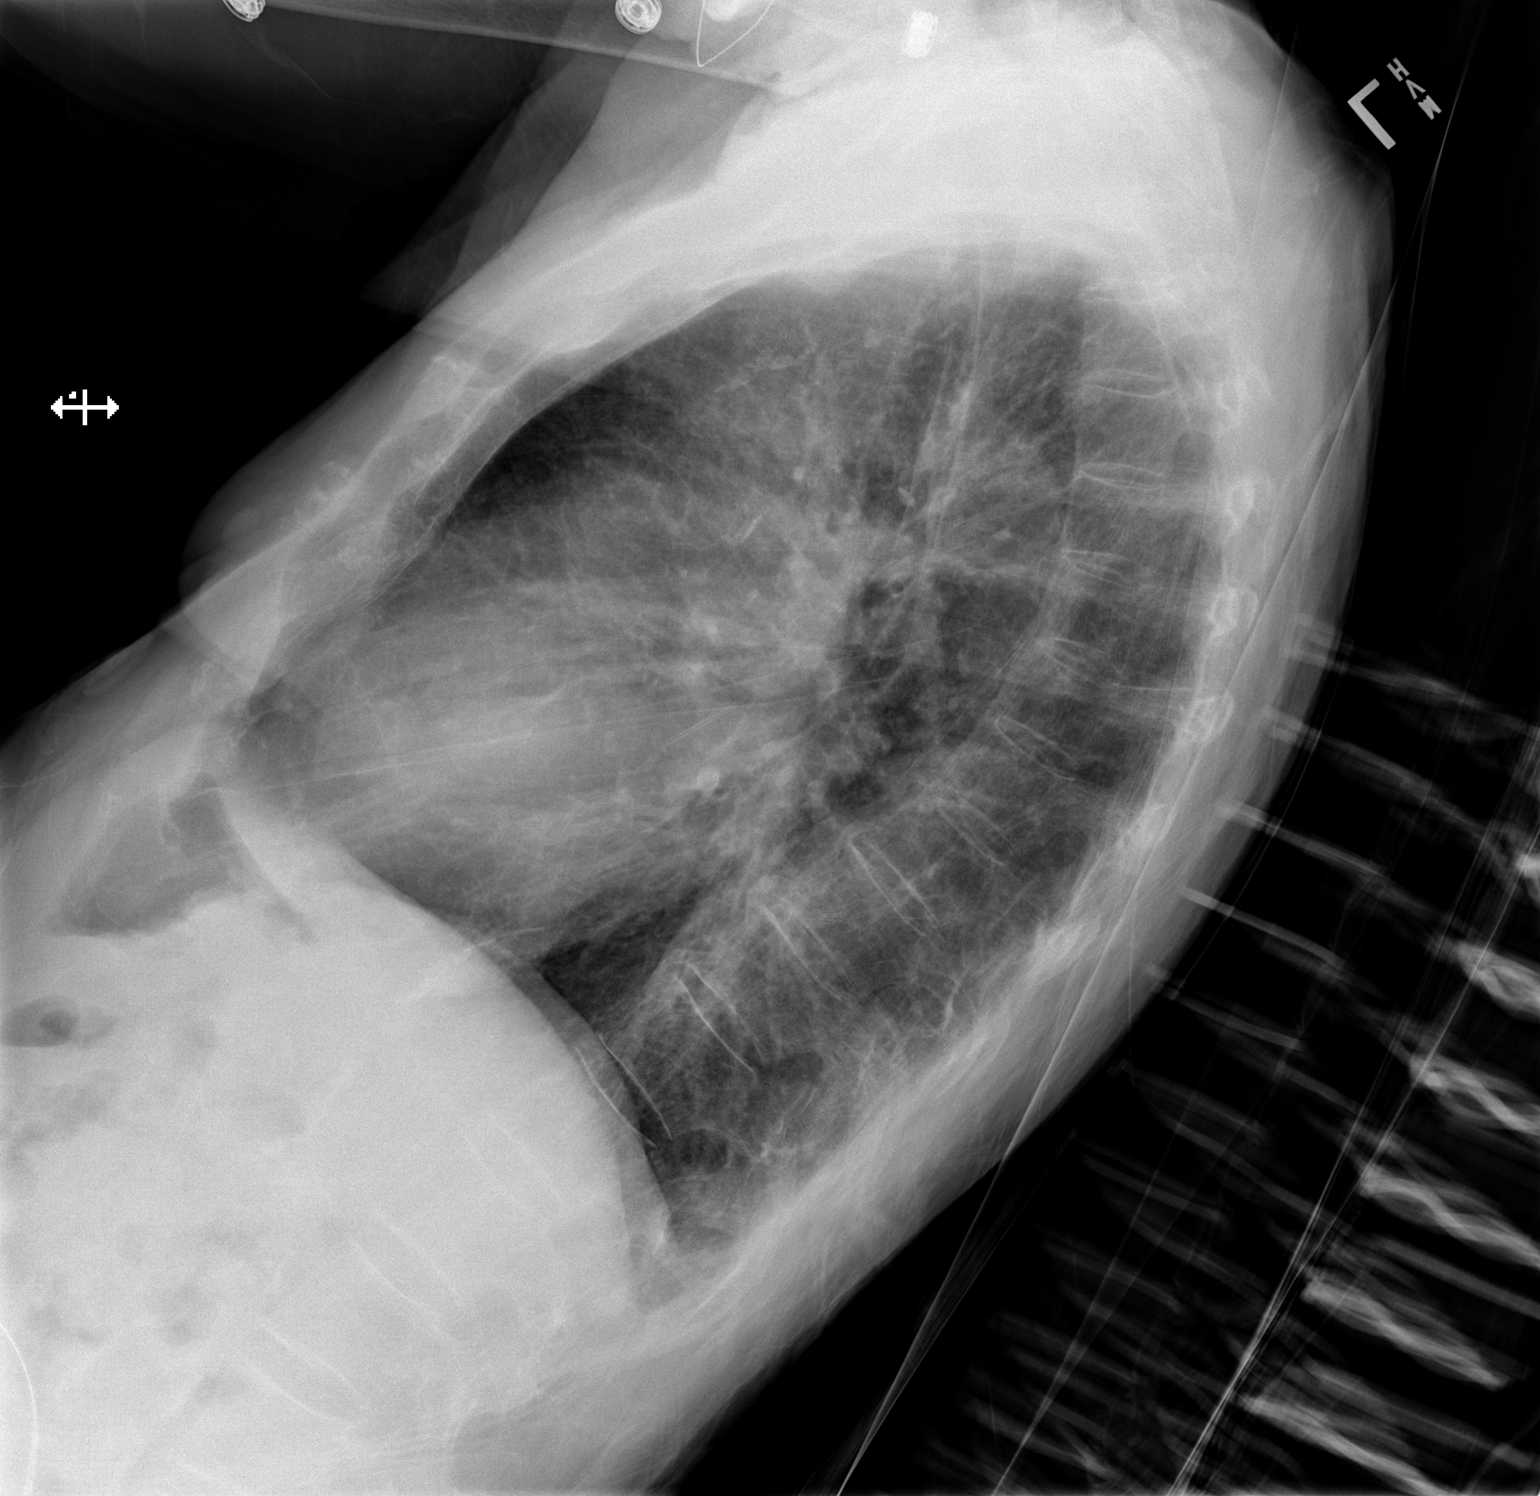

[x chest ap]
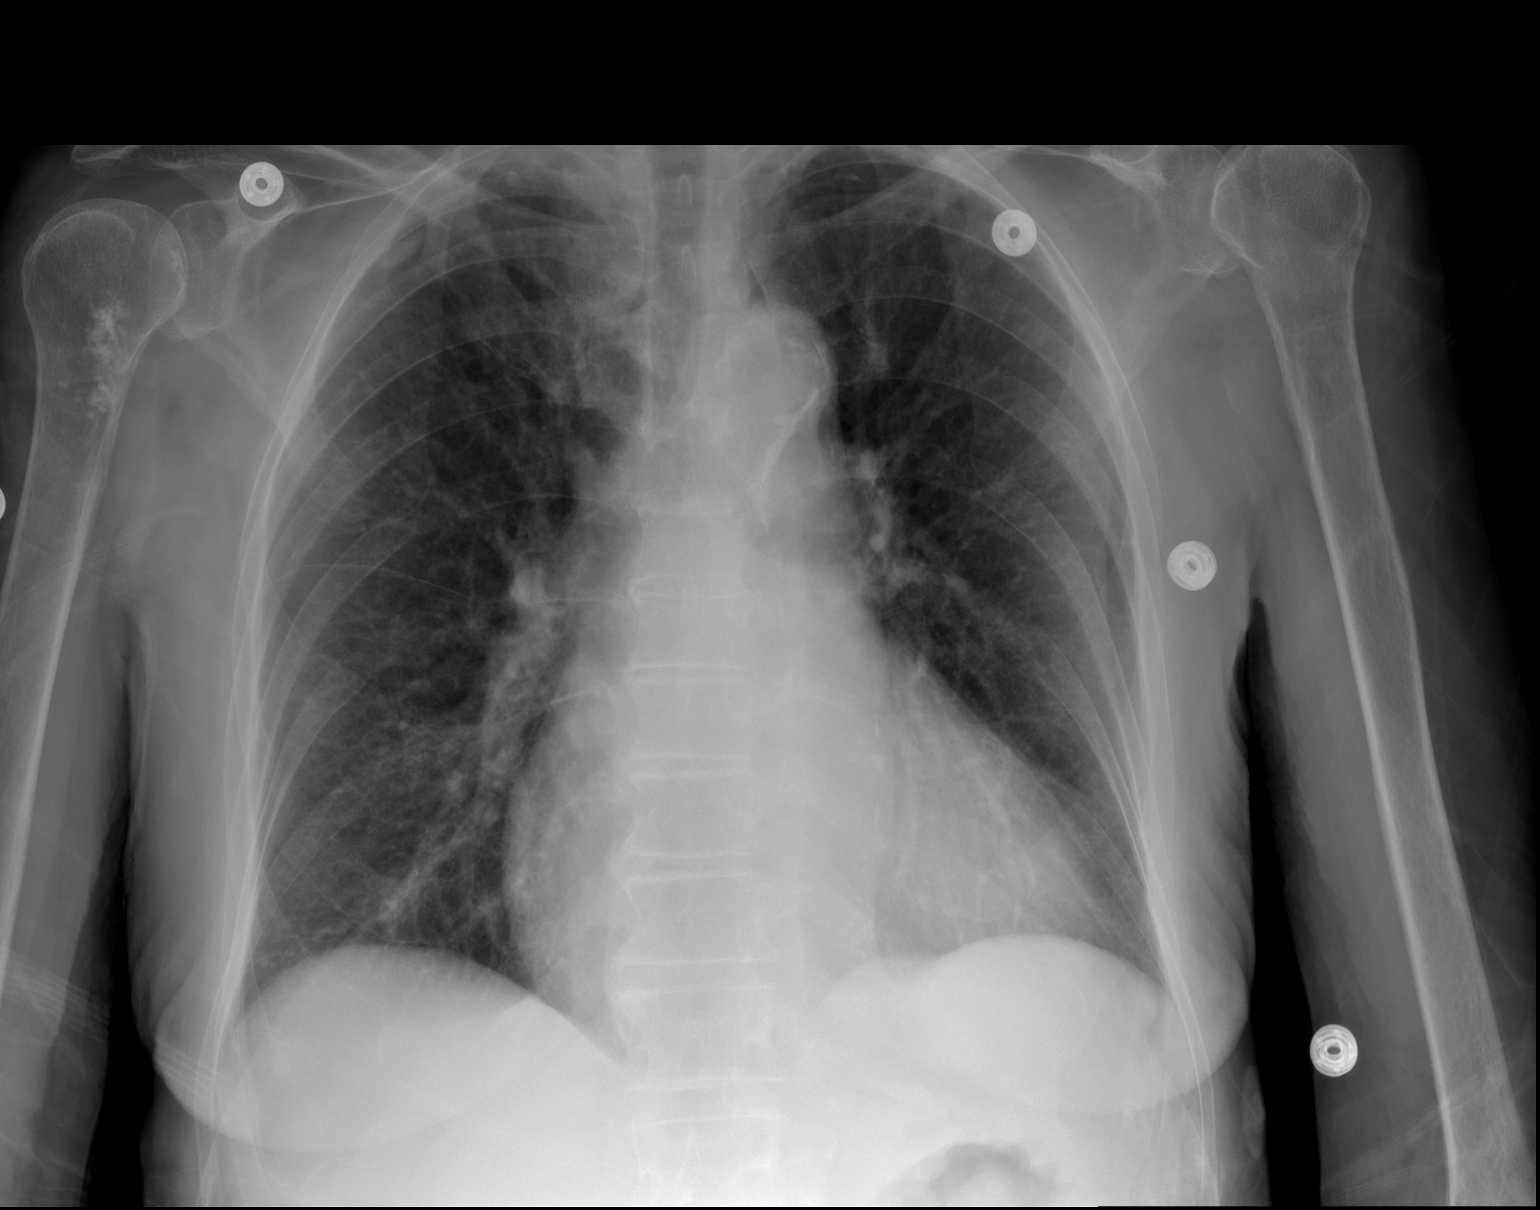

[2 of 2 positions shown; findings below may reference images not displayed]

FINDINGS: No consolidation or pleural effusion. Mild cardiomegaly with aortic
atherosclerosis. Emphysematous disease. No pneumothorax. Sclerosis
in the proximal right humerus may reflect bone infarct or chondroid
lesion.
IMPRESSION: No active cardiopulmonary disease.  Mild cardiomegaly.

## 2019-09-25 MED ORDER — LORATADINE 10 MG PO TABS
10.0000 mg | ORAL_TABLET | Freq: Every day | ORAL | Status: DC
  Administered 2019-09-26 – 2019-09-27 (×2): 10 mg via ORAL
  Filled 2019-09-25 (×2): qty 1

## 2019-09-25 MED ORDER — ONDANSETRON HCL 4 MG PO TABS: 4.0000 mg | ORAL_TABLET | Freq: Four times a day (QID) | ORAL | Status: DC | PRN

## 2019-09-25 MED ORDER — LORAZEPAM 2 MG/ML IJ SOLN
1.0000 mg | Freq: Once | INTRAMUSCULAR | Status: AC
  Administered 2019-09-25: 1 mg via INTRAVENOUS
  Filled 2019-09-25: qty 1

## 2019-09-25 MED ORDER — FLUTICASONE FUROATE-VILANTEROL 100-25 MCG/INH IN AEPB
1.0000 | INHALATION_SPRAY | Freq: Every day | RESPIRATORY_TRACT | Status: DC
  Administered 2019-09-27 – 2019-09-28 (×2): 1 via RESPIRATORY_TRACT
  Filled 2019-09-25: qty 28

## 2019-09-25 MED ORDER — INSULIN ASPART 100 UNIT/ML ~~LOC~~ SOLN
0.0000 [IU] | Freq: Three times a day (TID) | SUBCUTANEOUS | Status: DC
  Administered 2019-09-27: 2 [IU] via SUBCUTANEOUS
  Administered 2019-09-28: 12:00:00 3 [IU] via SUBCUTANEOUS
  Filled 2019-09-25: qty 0.06

## 2019-09-25 MED ORDER — ACETAMINOPHEN 500 MG PO TABS: 500.0000 mg | ORAL_TABLET | Freq: Three times a day (TID) | ORAL | Status: DC | PRN

## 2019-09-25 MED ORDER — ONDANSETRON HCL 4 MG/2ML IJ SOLN: 4.0000 mg | Freq: Four times a day (QID) | INTRAMUSCULAR | Status: DC | PRN

## 2019-09-25 MED ORDER — ATORVASTATIN CALCIUM 40 MG PO TABS
40.0000 mg | ORAL_TABLET | Freq: Every day | ORAL | Status: DC
  Administered 2019-09-26 – 2019-09-28 (×3): 40 mg via ORAL
  Filled 2019-09-25 (×3): qty 1

## 2019-09-25 MED ORDER — POLYETHYLENE GLYCOL 3350 17 G PO PACK: 17.0000 g | PACK | Freq: Every day | ORAL | Status: DC | PRN

## 2019-09-25 MED ORDER — POTASSIUM CHLORIDE CRYS ER 10 MEQ PO TBCR
10.0000 meq | EXTENDED_RELEASE_TABLET | Freq: Once | ORAL | Status: AC
  Administered 2019-09-25: 10 meq via ORAL
  Filled 2019-09-25: qty 1

## 2019-09-25 MED ORDER — OMEGA-3-ACID ETHYL ESTERS 1 G PO CAPS
1.0000 g | ORAL_CAPSULE | Freq: Every day | ORAL | Status: DC
  Administered 2019-09-26 – 2019-09-28 (×3): 1 g via ORAL
  Filled 2019-09-25 (×3): qty 1

## 2019-09-25 MED ORDER — AMLODIPINE BESYLATE 5 MG PO TABS
10.0000 mg | ORAL_TABLET | Freq: Every day | ORAL | Status: DC
  Administered 2019-09-26 – 2019-09-28 (×3): 10 mg via ORAL
  Filled 2019-09-25 (×3): qty 2

## 2019-09-25 MED ORDER — METOPROLOL TARTRATE 50 MG PO TABS
50.0000 mg | ORAL_TABLET | Freq: Every day | ORAL | Status: DC
  Administered 2019-09-26 – 2019-09-28 (×3): 50 mg via ORAL
  Filled 2019-09-25 (×3): qty 2

## 2019-09-25 MED ORDER — SODIUM CHLORIDE 0.9 % IV SOLN: INTRAVENOUS | Status: DC

## 2019-09-25 MED ORDER — SODIUM CHLORIDE 0.9% IV SOLUTION: Freq: Once | INTRAVENOUS | Status: DC

## 2019-09-25 MED ORDER — CLOPIDOGREL BISULFATE 75 MG PO TABS
75.0000 mg | ORAL_TABLET | Freq: Every day | ORAL | Status: DC
  Administered 2019-09-26 – 2019-09-28 (×3): 75 mg via ORAL
  Filled 2019-09-25 (×3): qty 1

## 2019-09-25 MED ORDER — ENOXAPARIN SODIUM 30 MG/0.3ML ~~LOC~~ SOLN: 30.0000 mg | SUBCUTANEOUS | Status: DC

## 2019-09-25 NOTE — ED Notes (Signed)
Pt provided sandwich and water. PT positioned in bed to eat.

## 2019-09-25 NOTE — ED Notes (Signed)
Patient removed IV and monitoring devices. Attempted to reorient patient.

## 2019-09-25 NOTE — ED Notes (Signed)
Patient placed on fall alarm.

## 2019-09-25 NOTE — ED Triage Notes (Signed)
Pt arriving by EMS from [facility], staff reports lab work showed low hemoglobin.   Pt has no complaints at this time  BP 140/50 HR 74 99% Ra cbg 171

## 2019-09-25 NOTE — ED Notes (Signed)
PT transported to MRI

## 2019-09-25 NOTE — ED Provider Notes (Addendum)
[facility] DEPT Provider Note   CSN: [id number] Arrival date & time: 09/25/19  1327     History Chief Complaint  Patient presents with  . Abnormal Lab    Chloe Fletcher is a 79 y.o. female.  HPI   79 year old female with history of back pain, carotid artery stenosis, cognitive decline, diabetes, hypertension, renal disorder, who presents the emergency department today for evaluation of abnormal lab.  There is a level 5 caveat as patient has a history of dementia and is unable to provide any reliable history.  Per the facility, patient was noted to have anemia today on her labs.  Her hemoglobin is 7.9.  2:52 PM Discussed case with staff her facility. She states that patient has been more confused for the last few weeks. She is demented at baseline. [place] hGb 3/19 was 8.9   Past Medical History:  Diagnosis Date  . Back pain   . Carotid artery stenosis   . Cognitive decline   . Diabetes mellitus without complication ([place])   . Hypertension   . Renal disorder     Patient Active Problem List   Diagnosis Date Noted  . Anemia 09/25/2019  . Hypertensive crisis 08/02/2019  . Acute cystitis 08/02/2019  . Hypertensive urgency 08/02/2019  . Diabetes mellitus without complication ([place])   . CKD (chronic kidney disease) stage 3, GFR 30-59 ml/min   . COPD (chronic obstructive pulmonary disease) ([facility])     No past surgical history on file.   OB History   No obstetric history on file.     No family history on file.  Social History   Tobacco Use  . Smoking status: Current Every Day Smoker  . Smokeless tobacco: Never Used  Substance Use Topics  . Alcohol use: Not Currently  . Drug use: Not Currently    Home Medications Prior to Admission medications   Medication Sig Start Date End Date Taking? Authorizing Provider  acetaminophen (TYLENOL) 500 MG tablet Take 500 mg by mouth every 8 (eight) hours as needed for mild pain or headache.   Yes  [provider]  amLODipine (NORVASC) 10 MG tablet Take 1 tablet (10 mg total) by mouth daily. 08/10/19  Yes Antonieta Pert, MD  atorvastatin (LIPITOR) 40 MG tablet Take 1 tablet (40 mg total) by mouth daily at 6 PM. Patient taking differently: Take 40 mg by mouth daily.  08/10/19  Yes Antonieta Pert, MD  cetirizine (ZYRTEC) 10 MG tablet Take 10 mg by mouth daily.    Yes [provider]  Cholecalciferol (VITAMIN D3) 25 MCG (1000 UT) CAPS Take 1,000 Units by mouth daily.    Yes [provider]  clopidogrel (PLAVIX) 75 MG tablet Take 75 mg by mouth daily.   Yes [provider]  fenofibrate (TRICOR) 48 MG tablet Take 48 mg by mouth daily.   Yes [provider]  fluticasone furoate-vilanterol (BREO ELLIPTA) 100-25 MCG/INH AEPB Inhale 1 puff into the lungs daily.   Yes [provider]  Glucosamine-Chondroit-Vit C-Mn (GLUCOSAMINE 1500 COMPLEX PO) Take 1,500 mg by mouth in the morning and at bedtime.   Yes [provider]  LORazepam (ATIVAN) 0.5 MG tablet Take 0.5 mg by mouth every 12 (twelve) hours as needed for anxiety. 09/04/19  Yes [provider]  losartan (COZAAR) 50 MG tablet Take 1 tablet (50 mg total) by mouth daily. Patient taking differently: Take 50 mg by mouth 2 (two) times daily.  08/10/19  Yes Antonieta Pert, MD  metFORMIN (GLUCOPHAGE) 500 MG tablet Take 500 mg by mouth daily with breakfast.   Yes [provider]  metoprolol tartrate (LOPRESSOR) 50 MG tablet Take 50 mg by mouth daily.    Yes [provider]  Omega-3 Fatty Acids (FISH OIL) 1000 MG CAPS Take 1,000 mg by mouth daily.   Yes [provider]  Multiple Vitamin (MULTIVITAMIN WITH MINERALS) TABS tablet Take 1 tablet by mouth daily. Patient not taking: Reported on 09/25/2019 08/10/19   Antonieta Pert, MD  vitamin B-12 1000 MCG tablet Take 1 tablet (1,000 mcg total) by mouth daily. Patient not taking: Reported on 09/25/2019 08/10/19   Antonieta Pert, MD     Allergies    Patient has no known allergies.  Review of Systems   Review of Systems  Unable to perform ROS: Dementia    Physical Exam Updated Vital Signs BP (!) 159/61   Pulse 76   Temp 97.6 F (36.4 C) (Oral)   Resp 18   Ht 5\' 2"  (1.575 m)   Wt 54 kg   SpO2 99%   BMI 21.77 kg/m   Physical Exam Vitals and nursing note reviewed.  Constitutional:      General: She is not in acute distress.    Appearance: She is well-developed.  HENT:     Head: Normocephalic and atraumatic.  Eyes:     Conjunctiva/sclera: Conjunctivae normal.     Comments: Pale conjunctiva  Cardiovascular:     Rate and Rhythm: Normal rate and regular rhythm.  Pulmonary:     Effort: Pulmonary effort is normal.     Breath sounds: Normal breath sounds.  Abdominal:     General: Bowel sounds are normal.     Palpations: Abdomen is soft.     Tenderness: There is no guarding.  Musculoskeletal:        General: Normal range of motion.     Cervical back: Neck supple.  Skin:    General: Skin is warm and dry.  Neurological:     Mental Status: She is alert.     ED Results / Procedures / Treatments   Labs (all labs ordered are listed, but only abnormal results are displayed) Labs Reviewed  CBC WITH DIFFERENTIAL/PLATELET - Abnormal; Notable for the following components:      Result Value   RBC 2.55 (*)    Hemoglobin 7.7 (*)    HCT 24.1 (*)    All other components within normal limits  BASIC METABOLIC PANEL - Abnormal; Notable for the following components:   Potassium 3.4 (*)    Creatinine, Ser 1.52 (*)    GFR calc non Af Amer 32 (*)    GFR calc Af Amer 38 (*)    All other components within normal limits  SARS CORONAVIRUS 2 (TAT 6-24 HRS)  VITAMIN B12  FOLATE  IRON AND TIBC  FERRITIN  RETICULOCYTES  URINALYSIS, ROUTINE W REFLEX MICROSCOPIC  POC OCCULT BLOOD, ED  TYPE AND SCREEN  ABO/RH  PREPARE RBC (CROSSMATCH)    EKG None  Radiology No results found.  Procedures Procedures  (including critical care time) CRITICAL CARE Performed by: Rodney Booze   Total critical care time: 36 minutes  Critical care time was exclusive of separately billable procedures and treating other patients.  Critical care was necessary to treat or prevent imminent or life-threatening deterioration.  Critical care was time spent personally by me on the following activities: development of treatment plan with patient and/or surrogate as well as nursing, discussions with  consultants, evaluation of patient's response to treatment, examination of patient, obtaining history from patient or surrogate, ordering and performing treatments and interventions, ordering and review of laboratory studies, ordering and review of radiographic studies, pulse oximetry and re-evaluation of patient's condition.   Medications Ordered in ED Medications  0.9 %  sodium chloride infusion ( Intravenous New Bag/Given 09/25/19 1428)  0.9 %  sodium chloride infusion (Manually program via Guardrails IV Fluids) (has no administration in time range)    ED Course  I have reviewed the triage vital signs and the nursing notes.  Pertinent labs & imaging results that were available during my care of the patient were reviewed by me and considered in my medical decision making (see chart for details).    MDM Rules/Calculators/A&P                      79 year old female presenting for evaluation of anemia noted on lab values at her facility prior to arrival.  She is unable to provide any history due to her history of dementia.  Her vital signs are reassuring.  She is actually somewhat hypertensive.  She does not have any tachycardia.  Reviewed/interpreted lab CBC with anemia noted at 7.7, normal WBC count  - Reviewed prior records.  Patient's lastin Epic CBC was 1 month ago and noted to be anemic at around 10.  - Per facility, pts last labs were on 3/19 where she had a Hgb 8.9  - Pt given 1 unit PRBC BMP with mild  hypokalemia.  Creatinine is elevated at 1.52 but this appears consistent with the patient's baseline. Hemoccult is negative   4:09 PM Discussed case with the patient's primary provider, Johny Sax (with [place]). She feels that because of the precipitous drop in hGb she would feel more comfortable with the patient being admitted  5:37 PM discussed case with the patient's daughter, who is her POA.  She does give consent for the patient to have a blood transfusion. She is requesting an iron panel.  5:30 PM CONSULT With Dr. Tyrell Antonio who accepts patient for admission.   Pt seen in conjunction with Dr. Eulis Foster who personally evaluated the patient and is in agreement with plan.   Final Clinical Impression(s) / ED Diagnoses Final diagnoses:  Anemia, unspecified type    Rx / DC Orders ED Discharge Orders    None       Rodney Booze, PA-C 09/25/19 1738    Pacey Willadsen S, PA-C 09/25/19 1739    Praise Dolecki S, PA-C 09/25/19 1739    Daleen Bo, MD 09/26/19 [phone]

## 2019-09-25 NOTE — H&P (Signed)
History and Physical  Chloe Fletcher [id number] DOB: July 08, 1940 DOA: 09/25/2019  PCP: Orvis Brill, [facility] Patient coming from: [place] have personally briefly reviewed patient's old medical records in [place]   Chief Complaint: Low Hb, worsening confusion.   HPI: Chloe Fletcher is a 79 y.o. female past medical history significant for carotid artery stenosis, CKD stage IIIb cognitive decline, diabetes mellitus without complication, hypertension, who presents from [place] facility due to abnormal lab, patient was found to have low hemoglobin.  Hemoglobin reported at 7.9 from previously 8.9--10.  Patient has been noted to be more confused for the last few weeks. Per daughter report, patient has decreased clarity and confusion for the last 2 weeks.  Patient is not able to remember to help operate her phone.  She was able to call her daughter through face time and she is not able to do that anymore.  Patient had a stroke late December early January, and since then she has been having memory problem but after rehab she improved some.  Daughter reports patient has been incontinence of urine. Patient is not able to provide history.  Patient is confused.  Evaluation in the ED: Afebrile, sodium 142, potassium 3.4, creatinine 1.5, BUN 13, white blood cell 6, hemoglobin 7.7, platelets 372, fecal occult blood negative.    Review of Systems: All systems reviewed and apart from history of presenting illness, are negative.  Past Medical History:  Diagnosis Date  . Back pain   . Carotid artery stenosis   . Cognitive decline   . Diabetes mellitus without complication ([place])   . Hypertension   . Renal disorder    No past surgical history on file. Social History:  reports that she has been smoking. She has never used smokeless tobacco. She reports previous alcohol use. She reports previous drug use.   No Known Allergies Family history: Unable to obtain due to patient  cognitive decline.  Prior to Admission medications   Medication Sig Start Date End Date Taking? Authorizing Provider  acetaminophen (TYLENOL) 500 MG tablet Take 500 mg by mouth every 8 (eight) hours as needed for mild pain or headache.   Yes [provider]  amLODipine (NORVASC) 10 MG tablet Take 1 tablet (10 mg total) by mouth daily. 08/10/19  Yes Antonieta Pert, MD  atorvastatin (LIPITOR) 40 MG tablet Take 1 tablet (40 mg total) by mouth daily at 6 PM. Patient taking differently: Take 40 mg by mouth daily.  08/10/19  Yes Antonieta Pert, MD  cetirizine (ZYRTEC) 10 MG tablet Take 10 mg by mouth daily.    Yes [provider]  Cholecalciferol (VITAMIN D3) 25 MCG (1000 UT) CAPS Take 1,000 Units by mouth daily.    Yes [provider]  clopidogrel (PLAVIX) 75 MG tablet Take 75 mg by mouth daily.   Yes [provider]  fenofibrate (TRICOR) 48 MG tablet Take 48 mg by mouth daily.   Yes [provider]  fluticasone furoate-vilanterol (BREO ELLIPTA) 100-25 MCG/INH AEPB Inhale 1 puff into the lungs daily.   Yes [provider]  Glucosamine-Chondroit-Vit C-Mn (GLUCOSAMINE 1500 COMPLEX PO) Take 1,500 mg by mouth in the morning and at bedtime.   Yes [provider]  LORazepam (ATIVAN) 0.5 MG tablet Take 0.5 mg by mouth every 12 (twelve) hours as needed for anxiety. 09/04/19  Yes [provider]  losartan (COZAAR) 50 MG tablet Take 1 tablet (50 mg total) by mouth daily. Patient taking differently: Take  50 mg by mouth 2 (two) times daily.  08/10/19  Yes Antonieta Pert, MD  metFORMIN (GLUCOPHAGE) 500 MG tablet Take 500 mg by mouth daily with breakfast.   Yes [provider]  metoprolol tartrate (LOPRESSOR) 50 MG tablet Take 50 mg by mouth daily.    Yes [provider]  Omega-3 Fatty Acids (FISH OIL) 1000 MG CAPS Take 1,000 mg by mouth daily.   Yes [provider]  Multiple Vitamin (MULTIVITAMIN WITH MINERALS) TABS tablet Take 1  tablet by mouth daily. Patient not taking: Reported on 09/25/2019 08/10/19   Antonieta Pert, MD  vitamin B-12 1000 MCG tablet Take 1 tablet (1,000 mcg total) by mouth daily. Patient not taking: Reported on 09/25/2019 08/10/19   Antonieta Pert, MD   Physical Exam: Vitals:   09/25/19 1600 09/25/19 1615 09/25/19 1716 09/25/19 1800  BP: (!) 151/66 (!) 158/62 (!) 159/61 (!) 145/55  Pulse: 74 73 76 74  Resp: (!) 29 17 18 16   Temp:      TempSrc:      SpO2: 100% 98% 99% 98%  Weight:      Height:         General exam: Moderately built and nourished patient, lying comfortably supine on the gurney in no obvious distress.  Head, eyes and ENT: Nontraumatic and normocephalic. Pupils equally reacting to light and accommodation. Oral mucosa moist.  Neck: Supple. No JVD, carotid bruit or thyromegaly.  Lymphatics: No lymphadenopathy.  Respiratory system: Clear to auscultation. No increased work of breathing.  Cardiovascular system: S1 and S2 heard, RRR. No JVD, murmurs, gallops, clicks or pedal edema.  Gastrointestinal system: Abdomen is nondistended, soft and nontender. Normal bowel sounds heard. No organomegaly or masses appreciated.  Central nervous system: Alert and oriented to person.  She is confused.  Follows some commands.  Extremities: Symmetric 5 x 5 power. Peripheral pulses symmetrically felt.   Skin: No rashes or acute findings.  Musculoskeletal system: Negative exam.  Psychiatry: Pleasant and cooperative.   Labs on Admission:  Basic Metabolic Panel: Recent Labs  Lab 09/25/19 1403  NA 142  K 3.4*  CL 108  CO2 26  GLUCOSE 97  BUN 13  CREATININE 1.52*  CALCIUM 8.9   Liver Function Tests: No results for input(s): AST, ALT, ALKPHOS, BILITOT, PROT, ALBUMIN in the last 168 hours. No results for input(s): LIPASE, AMYLASE in the last 168 hours. No results for input(s): AMMONIA in the last 168 hours. CBC: Recent Labs  Lab 09/25/19 1403  WBC 6.0  NEUTROABS 3.9  HGB 7.7*  HCT  24.1*  MCV 94.5  PLT 372   Cardiac Enzymes: No results for input(s): CKTOTAL, CKMB, CKMBINDEX, TROPONINI in the last 168 hours.  BNP (last 3 results) No results for input(s): PROBNP in the last 8760 hours. CBG: No results for input(s): GLUCAP in the last 168 hours.  Radiological Exams on Admission: No results found.  EKG: non available.   Assessment/Plan Active Problems:   HTN (hypertension)   Anemia   Acute metabolic encephalopathy   AKI (acute kidney injury) ([place])   1-Anemia; no evidence of acute bleeding.  Occult blood negative. Anemia panel ordered. Patient will receive 1 unit of packed red blood cell. She will require outpatient evaluation if family wishes to proceed with further work-up.  2-Acute metabolic encephalopathy: Vs worsening dementia Rule out infectious process.  Will check UA, urine culture and chest x-ray. -Check MRI of the brain. -Check TSH, ammonia level, B12 level. -On Ativan medication list, daughter  was not aware that patient was taking Ativan.  Hold for now.  3-History of CVA: Continue with Plavix.  4-Hypertension: Continue with metoprolol.  Will hold Cozaar due to AKI.  5-AKI on chronic kidney disease a stage IIIb: Prior creatinine per records 1.3. Creatinine today up to 1.5.  Start IV fluids.  Hold Cozaar. 6-COPD;Breo.  7-DM; Hold metformin. SSI.   DVT Prophylaxis: SCD Code Status: DNR, discussed with Daughter.  Family Communication: Daughter updated.  Disposition Plan: admit for observation , evaluation of anemia and confusion.   Time spent: 75 minutes.   Elmarie Shiley MD [facility]   09/25/2019, 6:28 PM

## 2019-09-25 NOTE — ED Notes (Addendum)
Per MD request, patient's daughter, Mickel Baas, contacted and gave permission to administer Ativan prior to MRI.

## 2019-09-26 ENCOUNTER — Encounter (HOSPITAL_COMMUNITY): Payer: Self-pay | Admitting: Internal Medicine

## 2019-09-26 DIAGNOSIS — Y92099 Unspecified place in other non-institutional residence as the place of occurrence of the external cause: Secondary | ICD-10-CM | POA: Diagnosis not present

## 2019-09-26 DIAGNOSIS — F0390 Unspecified dementia without behavioral disturbance: Secondary | ICD-10-CM

## 2019-09-26 DIAGNOSIS — J449 Chronic obstructive pulmonary disease, unspecified: Secondary | ICD-10-CM

## 2019-09-26 DIAGNOSIS — F0150 Vascular dementia without behavioral disturbance: Secondary | ICD-10-CM | POA: Diagnosis present

## 2019-09-26 DIAGNOSIS — Z20822 Contact with and (suspected) exposure to covid-19: Secondary | ICD-10-CM | POA: Diagnosis present

## 2019-09-26 DIAGNOSIS — D649 Anemia, unspecified: Secondary | ICD-10-CM | POA: Diagnosis present

## 2019-09-26 DIAGNOSIS — I129 Hypertensive chronic kidney disease with stage 1 through stage 4 chronic kidney disease, or unspecified chronic kidney disease: Secondary | ICD-10-CM | POA: Diagnosis present

## 2019-09-26 DIAGNOSIS — R410 Disorientation, unspecified: Secondary | ICD-10-CM | POA: Diagnosis present

## 2019-09-26 DIAGNOSIS — Z8673 Personal history of transient ischemic attack (TIA), and cerebral infarction without residual deficits: Secondary | ICD-10-CM

## 2019-09-26 DIAGNOSIS — G92 Toxic encephalopathy: Secondary | ICD-10-CM | POA: Diagnosis present

## 2019-09-26 DIAGNOSIS — N179 Acute kidney failure, unspecified: Secondary | ICD-10-CM | POA: Diagnosis present

## 2019-09-26 DIAGNOSIS — Z7984 Long term (current) use of oral hypoglycemic drugs: Secondary | ICD-10-CM | POA: Diagnosis not present

## 2019-09-26 DIAGNOSIS — T450X5A Adverse effect of antiallergic and antiemetic drugs, initial encounter: Secondary | ICD-10-CM | POA: Diagnosis present

## 2019-09-26 DIAGNOSIS — N189 Chronic kidney disease, unspecified: Secondary | ICD-10-CM

## 2019-09-26 DIAGNOSIS — F17200 Nicotine dependence, unspecified, uncomplicated: Secondary | ICD-10-CM | POA: Diagnosis present

## 2019-09-26 DIAGNOSIS — Z7951 Long term (current) use of inhaled steroids: Secondary | ICD-10-CM | POA: Diagnosis not present

## 2019-09-26 DIAGNOSIS — Z66 Do not resuscitate: Secondary | ICD-10-CM | POA: Diagnosis present

## 2019-09-26 DIAGNOSIS — R17 Unspecified jaundice: Secondary | ICD-10-CM | POA: Diagnosis present

## 2019-09-26 DIAGNOSIS — G9341 Metabolic encephalopathy: Secondary | ICD-10-CM | POA: Diagnosis present

## 2019-09-26 DIAGNOSIS — N1832 Chronic kidney disease, stage 3b: Secondary | ICD-10-CM | POA: Diagnosis present

## 2019-09-26 DIAGNOSIS — T424X5A Adverse effect of benzodiazepines, initial encounter: Secondary | ICD-10-CM | POA: Diagnosis present

## 2019-09-26 DIAGNOSIS — I10 Essential (primary) hypertension: Secondary | ICD-10-CM

## 2019-09-26 DIAGNOSIS — E876 Hypokalemia: Secondary | ICD-10-CM | POA: Diagnosis present

## 2019-09-26 DIAGNOSIS — Z79899 Other long term (current) drug therapy: Secondary | ICD-10-CM | POA: Diagnosis not present

## 2019-09-26 DIAGNOSIS — E1122 Type 2 diabetes mellitus with diabetic chronic kidney disease: Secondary | ICD-10-CM | POA: Diagnosis present

## 2019-09-26 DIAGNOSIS — Z7902 Long term (current) use of antithrombotics/antiplatelets: Secondary | ICD-10-CM | POA: Diagnosis not present

## 2019-09-26 LAB — CBC
HCT: 30.1 % — ABNORMAL LOW (ref 36.0–46.0)
Hemoglobin: 9.7 g/dL — ABNORMAL LOW (ref 12.0–15.0)
MCH: 29.5 pg (ref 26.0–34.0)
MCHC: 32.2 g/dL (ref 30.0–36.0)
MCV: 91.5 fL (ref 80.0–100.0)
Platelets: 340 10*3/uL (ref 150–400)
RBC: 3.29 MIL/uL — ABNORMAL LOW (ref 3.87–5.11)
RDW: 14 % (ref 11.5–15.5)
WBC: 6.2 10*3/uL (ref 4.0–10.5)
nRBC: 0 % (ref 0.0–0.2)

## 2019-09-26 LAB — COMPREHENSIVE METABOLIC PANEL
ALT: 11 U/L (ref 0–44)
AST: 19 U/L (ref 15–41)
Albumin: 2.7 g/dL — ABNORMAL LOW (ref 3.5–5.0)
Alkaline Phosphatase: 43 U/L (ref 38–126)
Anion gap: 8 (ref 5–15)
BUN: 9 mg/dL (ref 8–23)
CO2: 24 mmol/L (ref 22–32)
Calcium: 8.6 mg/dL — ABNORMAL LOW (ref 8.9–10.3)
Chloride: 110 mmol/L (ref 98–111)
Creatinine, Ser: 1.08 mg/dL — ABNORMAL HIGH (ref 0.44–1.00)
GFR calc Af Amer: 57 mL/min — ABNORMAL LOW (ref >60)
GFR calc non Af Amer: 49 mL/min — ABNORMAL LOW (ref >60)
Glucose, Bld: 108 mg/dL — ABNORMAL HIGH (ref 70–99)
Potassium: 3.1 mmol/L — ABNORMAL LOW (ref 3.5–5.1)
Sodium: 142 mmol/L (ref 135–145)
Total Bilirubin: 2.6 mg/dL — ABNORMAL HIGH (ref 0.3–1.2)
Total Protein: 5.7 g/dL — ABNORMAL LOW (ref 6.5–8.1)

## 2019-09-26 LAB — BILIRUBIN, FRACTIONATED(TOT/DIR/INDIR)
Bilirubin, Direct: 0.4 mg/dL — ABNORMAL HIGH (ref 0.0–0.2)
Indirect Bilirubin: 2.8 mg/dL — ABNORMAL HIGH (ref 0.3–0.9)
Total Bilirubin: 3.2 mg/dL — ABNORMAL HIGH (ref 0.3–1.2)

## 2019-09-26 LAB — URINALYSIS, ROUTINE W REFLEX MICROSCOPIC
Bacteria, UA: NONE SEEN
Bilirubin Urine: NEGATIVE
Glucose, UA: NEGATIVE mg/dL
Ketones, ur: NEGATIVE mg/dL
Leukocytes,Ua: NEGATIVE
Nitrite: NEGATIVE
Protein, ur: >=300 mg/dL — AB
Specific Gravity, Urine: 1.007 (ref 1.005–1.030)
pH: 7 (ref 5.0–8.0)

## 2019-09-26 LAB — GLUCOSE, CAPILLARY
Glucose-Capillary: 105 mg/dL — ABNORMAL HIGH (ref 70–99)
Glucose-Capillary: 101 mg/dL — ABNORMAL HIGH (ref 70–99)
Glucose-Capillary: 99 mg/dL (ref 70–99)
Glucose-Capillary: 102 mg/dL — ABNORMAL HIGH (ref 70–99)

## 2019-09-26 LAB — DIRECT ANTIGLOBULIN TEST (NOT AT ARMC)
DAT, IgG: NEGATIVE
DAT, complement: NEGATIVE

## 2019-09-26 LAB — URINE CULTURE: Culture: NO GROWTH

## 2019-09-26 LAB — SARS CORONAVIRUS 2 (TAT 6-24 HRS): SARS Coronavirus 2: NEGATIVE

## 2019-09-26 LAB — SAVE SMEAR(SSMR), FOR PROVIDER SLIDE REVIEW

## 2019-09-26 MED ORDER — ENOXAPARIN SODIUM 40 MG/0.4ML ~~LOC~~ SOLN
40.0000 mg | SUBCUTANEOUS | Status: DC
  Administered 2019-09-26 – 2019-09-27 (×2): 40 mg via SUBCUTANEOUS
  Filled 2019-09-26 (×2): qty 0.4

## 2019-09-26 MED ORDER — FERROUS SULFATE 325 (65 FE) MG PO TABS
325.0000 mg | ORAL_TABLET | Freq: Every day | ORAL | Status: DC
  Administered 2019-09-27 – 2019-09-28 (×2): 325 mg via ORAL
  Filled 2019-09-26 (×2): qty 1

## 2019-09-26 MED ORDER — HYDRALAZINE HCL 25 MG PO TABS
25.0000 mg | ORAL_TABLET | Freq: Two times a day (BID) | ORAL | Status: DC
  Administered 2019-09-26 – 2019-09-27 (×3): 25 mg via ORAL
  Filled 2019-09-26 (×3): qty 1

## 2019-09-26 MED ORDER — VITAMIN B-12 1000 MCG PO TABS
1000.0000 ug | ORAL_TABLET | Freq: Every day | ORAL | Status: DC
  Administered 2019-09-26 – 2019-09-28 (×3): 1000 ug via ORAL
  Filled 2019-09-26 (×3): qty 1

## 2019-09-26 MED ORDER — FERROUS SULFATE 325 (65 FE) MG PO TABS: 325.0000 mg | ORAL_TABLET | Freq: Every day | ORAL | 3 refills | Status: DC

## 2019-09-26 MED ORDER — CYANOCOBALAMIN 1000 MCG PO TABS: 1000.0000 ug | ORAL_TABLET | Freq: Every day | ORAL | 1 refills | Status: DC

## 2019-09-26 MED ORDER — METOPROLOL TARTRATE 5 MG/5ML IV SOLN
5.0000 mg | INTRAVENOUS | Status: AC | PRN
  Administered 2019-09-26: 5 mg via INTRAVENOUS
  Filled 2019-09-26: qty 5

## 2019-09-26 MED ORDER — HYDRALAZINE HCL 25 MG PO TABS: 25.0000 mg | ORAL_TABLET | Freq: Two times a day (BID) | ORAL | 0 refills | Status: AC

## 2019-09-26 MED ORDER — POTASSIUM CHLORIDE CRYS ER 20 MEQ PO TBCR
40.0000 meq | EXTENDED_RELEASE_TABLET | Freq: Once | ORAL | Status: AC
  Administered 2019-09-26: 40 meq via ORAL
  Filled 2019-09-26: qty 4

## 2019-09-26 NOTE — Progress Notes (Signed)
PROGRESS NOTE    Chloe Fletcher  [id number] DOB: 24-Oct-1940 DOA: 09/25/2019 PCP: Orvis Brill, [facility]   Brief Narrative:  Chloe Fletcher is a 79 y.o. female past medical history significant for carotid artery stenosis, CKD stage IIIb cognitive decline, diabetes mellitus without complication, hypertension, who presents from [place] facility due to abnormal lab, patient was found to have low hemoglobin.  Hemoglobin reported at 7.9 from previously 8.9--10.  Patient has been noted to be more confused for the last few weeks. Per daughter report, patient has decreased clarity and confusion for the last 2 weeks.  Patient is not able to remember to help operate her phone.  She was able to call her daughter through face time and she is not able to do that anymore.  Patient had a stroke late December early January, and since then she has been having memory problem but after rehab she improved some.  Daughter reports patient has been incontinence of urine. Patient is not able to provide history.  Patient is confused.  Evaluation in the ED: Afebrile, sodium 142, potassium 3.4, creatinine 1.5, BUN 13, white blood cell 6, hemoglobin 7.7, platelets 372, fecal occult blood negative.   Assessment & Plan:   Active Problems:   HTN (hypertension)   Anemia   Acute metabolic encephalopathy   AKI (acute kidney injury) ([place])   1-Anemia; Work up in process.  Bilirubin elevated, indirect.  Will proceed with Work up for Hemolysis; peripheral smear, Haptoglobin and Direct antiglobulin test.  B 12 low normal, Ferritin 25 iron 38. Dr Benay Spice will see patient in consultation. Received one unit PRBC. Hb increase to 9.   2-Acute metabolic encephalopathy: Vs worsening dementia Rule out infectious process. UA chest x ray negative Per family patient has been more confuse over last 2 weeks.  -MRI of the brain negative for Stroke.  -TSH normal, ammonia level normal. , B12 level normal.  -On Ativan medication  list, daughter was not aware that patient was taking Ativan.  Hold for now.  3-History of CVA: Continue with Plavix.  4-Hypertension: Continue with metoprolol.  Will hold Cozaar due to AKI. Start Hydralazine.   5-AKI on chronic kidney disease a stage IIIb: Prior creatinine per records 1.3. Hold Cozaar. Cr down to 1.1.  Improved.   6-COPD;Breo.   7-DM; Hold metformin. SSI.   8-Hypokalemia. Replete orally.   Estimated body mass index is 21.77 kg/m as calculated from the following:   Height as of this encounter: 5\' 2"  (1.575 m).   Weight as of this encounter: 54 kg.   DVT prophylaxis: Lovenox Code Status: DNR Family Communication: Daughter over phone Disposition Plan:  Patient is from: [place].  Anticipated d/c date: [place] Barriers to d/c or necessity for inpatient status: Remain in the hospital for evaluation of Hemolysis, patient with increase Bili and new worsening anemia. Hematologist oncologist consulted.   Consultants:   Dr Benay Spice  Procedures:     Antimicrobials:    Subjective: Alert, confuse  Objective: Vitals:   09/26/19 0524 09/26/19 0524 09/26/19 0614 09/26/19 0941  BP: (!) 204/64 (!) 201/55 (!) 179/71 (!) 191/61  Pulse:  83 87 79  Resp:  18 (!) 22 18  Temp:  98 F (36.7 C) 98.4 F (36.9 C) 98 F (36.7 C)  TempSrc:  Oral  Oral  SpO2:  97% 96% 97%  Weight:      Height:        Intake/Output Summary (Last 24 hours) at 09/26/2019 1305 Last data filed at 09/26/2019 0600  Gross per 24 hour  Intake 490 ml  Output 450 ml  Net 40 ml   Filed Weights   09/25/19 1352  Weight: 54 kg    Examination:  General exam: Appears calm and comfortable  Respiratory system: Clear to auscultation. Respiratory effort normal. Cardiovascular system: S1 & S2 heard, RRR. No JVD, murmurs, rubs, gallops or clicks. No pedal edema. Gastrointestinal system: Abdomen is nondistended, soft and nontender. No organomegaly or masses felt. Normal bowel sounds  heard. Central nervous system: Alert and oriented.  Extremities: Symmetric 5 x 5 power. Skin: No rashes, lesions or ulcers    Data Reviewed: I have personally reviewed following labs and imaging studies  CBC: Recent Labs  Lab 09/25/19 1403 09/26/19 0838  WBC 6.0 6.2  NEUTROABS 3.9  --   HGB 7.7* 9.7*  HCT 24.1* 30.1*  MCV 94.5 91.5  PLT 372 [id number]   Basic Metabolic Panel: Recent Labs  Lab 09/25/19 1403 09/26/19 0838  NA 142 142  K 3.4* 3.1*  CL 108 110  CO2 26 24  GLUCOSE 97 108*  BUN 13 9  CREATININE 1.52* 1.08*  CALCIUM 8.9 8.6*   GFR: Estimated Creatinine Clearance: 34 mL/min (A) (by C-G formula based on SCr of 1.08 mg/dL (H)). Liver Function Tests: Recent Labs  Lab 09/26/19 0838 09/26/19 1210  AST 19  --   ALT 11  --   ALKPHOS 43  --   BILITOT 2.6* 3.2*  PROT 5.7*  --   ALBUMIN 2.7*  --    No results for input(s): LIPASE, AMYLASE in the last 168 hours. Recent Labs  Lab 09/25/19 2051  AMMONIA 17   Coagulation Profile: No results for input(s): INR, PROTIME in the last 168 hours. Cardiac Enzymes: No results for input(s): CKTOTAL, CKMB, CKMBINDEX, TROPONINI in the last 168 hours. BNP (last 3 results) No results for input(s): PROBNP in the last 8760 hours. HbA1C: No results for input(s): HGBA1C in the last 72 hours. CBG: Recent Labs  Lab 09/25/19 2327 09/26/19 0725 09/26/19 1213  GLUCAP 107* 105* 101*   Lipid Profile: No results for input(s): CHOL, HDL, LDLCALC, TRIG, CHOLHDL, LDLDIRECT in the last 72 hours. Thyroid Function Tests: Recent Labs    09/25/19 1730  TSH 0.804   Anemia Panel: Recent Labs    09/25/19 1403 09/25/19 1730  VITAMINB12  --  335  FOLATE  --  9.5  FERRITIN  --  25  TIBC  --  308  IRON  --  38  RETICCTPCT 1.4  --    Sepsis Labs: No results for input(s): PROCALCITON, LATICACIDVEN in the last 168 hours.  Recent Results (from the past 240 hour(s))  SARS CORONAVIRUS 2 (TAT 6-24 HRS) Nasopharyngeal Nasopharyngeal  Swab     Status: None   Collection Time: 09/25/19  5:41 PM   Specimen: Nasopharyngeal Swab  Result Value Ref Range Status   SARS Coronavirus 2 NEGATIVE NEGATIVE Final    Comment: (NOTE) SARS-CoV-2 target nucleic acids are NOT DETECTED. The SARS-CoV-2 RNA is generally detectable in upper and lower respiratory specimens during the acute phase of infection. Negative results do not preclude SARS-CoV-2 infection, do not rule out co-infections with other pathogens, and should not be used as the sole basis for treatment or other patient management decisions. Negative results must be combined with clinical observations, patient history, and epidemiological information. The expected result is Negative. Fact Sheet for Patients: SugarRoll.be Fact Sheet for Healthcare Providers: https://www.woods-mathews.com/ This test is not yet approved or cleared  by the [place] and  has been authorized for detection and/or diagnosis of SARS-CoV-2 by FDA under an Emergency Use Authorization (EUA). This EUA will remain  in effect (meaning this test can be used) for the duration of the COVID-19 declaration under Section 56 4(b)(1) of the Act, 21 U.S.C. section 360bbb-3(b)(1), unless the authorization is terminated or revoked sooner. Performed at [facility], [place] [address], [place], [place] [address]          Radiology Studies: DG Chest 2 View  Result Date: 09/25/2019 CLINICAL DATA:  Low hemoglobin EXAM: CHEST - 2 VIEW COMPARISON:  CT 08/01/2019 FINDINGS: No consolidation or pleural effusion. Mild cardiomegaly with aortic atherosclerosis. Emphysematous disease. No pneumothorax. Sclerosis in the proximal right humerus may reflect bone infarct or chondroid lesion. IMPRESSION: No active cardiopulmonary disease.  Mild cardiomegaly. Electronically Signed   By: Donavan Foil M.D.   On: 09/25/2019 19:09   MR BRAIN WO CONTRAST  Result Date:  09/25/2019 CLINICAL DATA:  Delirium EXAM: MRI HEAD WITHOUT CONTRAST TECHNIQUE: Multiplanar, multiecho pulse sequences of the brain and surrounding structures were obtained without intravenous contrast. COMPARISON:  Brain MRI 08/03/2019 FINDINGS: Examination is severely degraded by motion. Brain: No acute infarct, acute hemorrhage or extra-axial collection. Diffuse confluent hyperintense T2-weighted signal within the periventricular, deep and juxtacortical white matter, most commonly due to chronic ischemic microangiopathy. There is generalized atrophy without lobar predilection. No chronic microhemorrhage. Normal midline structures. Vascular: Normal flow voids. Skull and upper cervical spine: Normal marrow signal. Sinuses/Orbits: Negative. Other: None. IMPRESSION: 1. Severely motion degraded study. 2. No acute intracranial abnormality. 3. 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 Electronically Signed   By: Ulyses Jarred M.D.   On: 09/25/2019 20:21        Scheduled Meds: . sodium chloride   Intravenous Once  . amLODipine  10 mg Oral Daily  . atorvastatin  40 mg Oral Daily  . clopidogrel  75 mg Oral Daily  . enoxaparin (LOVENOX) injection  30 mg Subcutaneous Q24H  . [START ON 09/27/2019] ferrous sulfate  325 mg Oral Q breakfast  . fluticasone furoate-vilanterol  1 puff Inhalation Daily  . hydrALAZINE  25 mg Oral BID  . insulin aspart  0-6 Units Subcutaneous TID WC  . loratadine  10 mg Oral Daily  . metoprolol tartrate  50 mg Oral Daily  . omega-3 acid ethyl esters  1 g Oral Daily  . vitamin B-12  1,000 mcg Oral Daily   Continuous Infusions: . sodium chloride 20 mL/hr at 09/25/19 1428     LOS: 0 days    Time spent: 35 minutes    Nya Monds A Kenora Spayd, MD [facility]   If 7PM-7AM, please contact night-coverage [email or web address]  09/26/2019, 1:05 PM

## 2019-09-26 NOTE — ED Provider Notes (Signed)
  Face-to-face evaluation   History: Patient presents for evaluation of low hemoglobin.  She lives in an assisted living facility.  She is unable to give any history due to dementia.  Physical exam: Alert nontoxic patient who is comfortable and conversant.  She is confused and disoriented.  No respiratory distress.  Abdomen soft and nontender to palpation.  Medical screening examination/treatment/procedure(s) were conducted as a shared visit with non-physician practitioner(s) and myself.  I personally evaluated the patient during the encounter    Daleen Bo, MD 09/26/19 [phone]

## 2019-09-26 NOTE — Progress Notes (Addendum)
Pt was being discharged to [facility] today. MD cancelled discharge orders pending stat Bilirubin level. Legal Guardian Daisy Lazar) contacted, no answer lvm.

## 2019-09-26 NOTE — Consult Note (Signed)
New Hematology/Oncology Consult   Requesting MD: Dr. Tyrell Antonio        Reason for Consult: Anemia  HPI: The history is from the medical chart as the patient has severe dementia  Chloe Fletcher presented to the emergency room yesterday after she was noted to have severe anemia at the skilled nursing facility with a hemoglobin of 7.9.  She was admitted for further evaluation.  She has a history of chronic anemia, we do not have remote CBC data available today. A CBC on 09/25/2019 found the hemoglobin at 7.7, MCV 94.5, platelets 372,000, white count 6.0 with an absolute neutrophil count of 3.9.  She was transfused with packed red blood cells earlier this morning.  Chloe Fletcher appears comfortable.  She has no complaint.    Past Medical History:  Diagnosis Date  . Back pain   . Carotid artery stenosis   . Cognitive decline   . Diabetes mellitus without complication ([place])   . Hypertension   .  Renal failure   : .   COPD   .   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 No pertinent surgical history.:   Current Facility-Administered Medications:  .  0.9 %  sodium chloride infusion (Manually program via Guardrails IV Fluids), , Intravenous, Once, Regalado, Belkys A, MD .  0.9 %  sodium chloride infusion, , Intravenous, Continuous, Regalado, Belkys A, MD, Last Rate: 20 mL/hr at 09/25/19 1428, New Bag at 09/25/19 1428 .  acetaminophen (TYLENOL) tablet 500 mg, 500 mg, Oral, Q8H PRN, Regalado, Belkys A, MD .  amLODipine (NORVASC) tablet 10 mg, 10 mg, Oral, Daily, Regalado, Belkys A, MD, 10 mg at 09/26/19 0920 .  atorvastatin (LIPITOR) tablet 40 mg, 40 mg, Oral, Daily, Regalado, Belkys A, MD, 40 mg at 09/26/19 0920 .  clopidogrel (PLAVIX) tablet 75 mg, 75 mg, Oral, Daily, Regalado, Belkys A, MD, 75 mg at 09/26/19 0920 .  enoxaparin (LOVENOX) injection 40 mg, 40 mg, Subcutaneous, Q24H, [place], Baltazar Najjar, RPH .  [START ON 09/27/2019] ferrous sulfate tablet 325 mg, 325 mg, Oral, Q  breakfast, Regalado, Belkys A, MD .  fluticasone furoate-vilanterol (BREO ELLIPTA) 100-25 MCG/INH 1 puff, 1 puff, Inhalation, Daily, Regalado, Belkys A, MD .  hydrALAZINE (APRESOLINE) tablet 25 mg, 25 mg, Oral, BID, Regalado, Belkys A, MD, 25 mg at 09/26/19 1246 .  insulin aspart (novoLOG) injection 0-6 Units, 0-6 Units, Subcutaneous, TID WC, Regalado, Belkys A, MD .  loratadine (CLARITIN) tablet 10 mg, 10 mg, Oral, Daily, Regalado, Belkys A, MD, 10 mg at 09/26/19 0920 .  metoprolol tartrate (LOPRESSOR) tablet 50 mg, 50 mg, Oral, Daily, Regalado, Belkys A, MD, 50 mg at 09/26/19 0920 .  omega-3 acid ethyl esters (LOVAZA) capsule 1 g, 1 g, Oral, Daily, Regalado, Belkys A, MD, 1 g at 09/26/19 0920 .  ondansetron (ZOFRAN) tablet 4 mg, 4 mg, Oral, Q6H PRN **OR** ondansetron (ZOFRAN) injection 4 mg, 4 mg, Intravenous, Q6H PRN, Regalado, Belkys A, MD .  polyethylene glycol (MIRALAX / GLYCOLAX) packet 17 g, 17 g, Oral, Daily PRN, Regalado, Belkys A, MD .  vitamin B-12 (CYANOCOBALAMIN) tablet 1,000 mcg, 1,000 mcg, Oral, Daily, Regalado, Belkys A, MD, 1,000 mcg at 09/26/19 1246:  . sodium chloride   Intravenous Once  . amLODipine  10 mg Oral Daily  . atorvastatin  40 mg Oral Daily  . clopidogrel  75 mg Oral Daily  . enoxaparin (LOVENOX) injection  40 mg Subcutaneous Q24H  . [START ON 09/27/2019] ferrous sulfate  325  mg Oral Q breakfast  . fluticasone furoate-vilanterol  1 puff Inhalation Daily  . hydrALAZINE  25 mg Oral BID  . insulin aspart  0-6 Units Subcutaneous TID WC  . loratadine  10 mg Oral Daily  . metoprolol tartrate  50 mg Oral Daily  . omega-3 acid ethyl esters  1 g Oral Daily  . vitamin B-12  1,000 mcg Oral Daily  :  No Known Allergies:  FH: Unable to obtain  SOCIAL HISTORY: She currently resides in an assisted living facility.  She smokes.  Review of Systems:  Positives include: No complaint, unable to obtain accurate review of systems  A complete ROS was otherwise  negative.   Physical Exam:  Blood pressure (!) 156/52, pulse 81, temperature 98 F (36.7 C), temperature source Oral, resp. rate 18, height '5\' 2"'  (1.575 m), weight 119 lb 0.8 oz (54 kg), SpO2 100 %.  HEENT: No thrush Lungs: Distant breath sounds, lungs clear bilaterally, no respiratory distress Cardiac: Regular rate and rhythm Abdomen: No hepatosplenomegaly  Vascular: No leg edema Lymph nodes: No cervical, supraclavicular, axillary, or inguinal nodes Neurologic: Alert, not oriented to place, year, or month.  Follows simple commands.  Moves all extremities. Skin: No rash Musculoskeletal: No spine tenderness  LABS:  Recent Labs    09/25/19 1403 09/26/19 0838  WBC 6.0 6.2  HGB 7.7* 9.7*  HCT 24.1* 30.1*  PLT 372 340    Recent Labs    09/25/19 1403 09/26/19 0838  NA 142 142  K 3.4* 3.1*  CL 108 110  CO2 26 24  GLUCOSE 97 108*  BUN 13 9  CREATININE 1.52* 1.08*  CALCIUM 8.9 8.6*   Blood smear 09/26/2019: I saw 1 or 2 hypersegmented neutrophils.  No blasts.  No monotonous white cell population.  The platelets appear increased in number with a few small platelet clumps.  Numerous burr cells and a acanthocytes.  No spherocytes.  The polychromasia is not increased.   RADIOLOGY:  DG Chest 2 View  Result Date: 09/25/2019 CLINICAL DATA:  Low hemoglobin EXAM: CHEST - 2 VIEW COMPARISON:  CT 08/01/2019 FINDINGS: No consolidation or pleural effusion. Mild cardiomegaly with aortic atherosclerosis. Emphysematous disease. No pneumothorax. Sclerosis in the proximal right humerus may reflect bone infarct or chondroid lesion. IMPRESSION: No active cardiopulmonary disease.  Mild cardiomegaly. Electronically Signed   By: Donavan Foil M.D.   On: 09/25/2019 19:09   MR BRAIN WO CONTRAST  Result Date: 09/25/2019 CLINICAL DATA:  Delirium EXAM: MRI HEAD WITHOUT CONTRAST TECHNIQUE: Multiplanar, multiecho pulse sequences of the brain and surrounding structures were obtained without intravenous  contrast. COMPARISON:  Brain MRI 08/03/2019 FINDINGS: Examination is severely degraded by motion. Brain: No acute infarct, acute hemorrhage or extra-axial collection. Diffuse confluent hyperintense T2-weighted signal within the periventricular, deep and juxtacortical white matter, most commonly due to chronic ischemic microangiopathy. There is generalized atrophy without lobar predilection. No chronic microhemorrhage. Normal midline structures. Vascular: Normal flow voids. Skull and upper cervical spine: Normal marrow signal. Sinuses/Orbits: Negative. Other: None. IMPRESSION: 1. Severely motion degraded study. 2. No acute intracranial abnormality. 3. 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 Electronically Signed   By: Ulyses Jarred M.D.   On: 09/25/2019 20:21    Assessment and Plan:   1.  Normocytic anemia 2.  Elevated indirect bilirubin 3.  Dementia 4.  COPD 5.  Hypertension 6.  CVA February 2021 7.  Renal failure  Chloe Fletcher was admitted with severe anemia.  She appears to  have a history of anemia dating at least to February of this year.  We do not have more remote CBC data available.  There is no clear explanation for the anemia.  The bilirubin is mildly elevated, but there is no other evidence for hemolysis.  There is no evidence for immune related hemolysis.  The elevated bilirubin may be related to Gilbert's syndrome, but we have no remote bilirubin level available today.  The differential diagnosis includes anemia related to renal failure, a primary bone marrow process such as myelodysplasia, malnutrition, and a hematopoietic malignancy.  I recommend a laboratory evaluation to rule out multiple myeloma in this elderly patient.  We can contact her family to discuss the plan for additional diagnostic evaluation in the setting of severe dementia.  Outpatient follow-up can be scheduled at the Cancer center for within the next 2 weeks.  Recommendations: 1.   Myeloma panel to include serum free light chains 2.  Erythropoietin level 3.  Consider diagnostic bone marrow biopsy as an outpatient after discussion with family 4.  Outpatient follow-up will be scheduled at the Cancer center   Betsy Coder, MD 09/26/2019, 4:50 PM

## 2019-09-27 ENCOUNTER — Other Ambulatory Visit: Payer: Self-pay | Admitting: *Deleted

## 2019-09-27 DIAGNOSIS — D649 Anemia, unspecified: Secondary | ICD-10-CM

## 2019-09-27 DIAGNOSIS — G9341 Metabolic encephalopathy: Secondary | ICD-10-CM

## 2019-09-27 LAB — CBC
HCT: 31.1 % — ABNORMAL LOW (ref 36.0–46.0)
Hemoglobin: 10.4 g/dL — ABNORMAL LOW (ref 12.0–15.0)
MCH: 30.5 pg (ref 26.0–34.0)
MCHC: 33.4 g/dL (ref 30.0–36.0)
MCV: 91.2 fL (ref 80.0–100.0)
Platelets: 340 10*3/uL (ref 150–400)
RBC: 3.41 MIL/uL — ABNORMAL LOW (ref 3.87–5.11)
RDW: 14.8 % (ref 11.5–15.5)
WBC: 5.9 10*3/uL (ref 4.0–10.5)
nRBC: 0 % (ref 0.0–0.2)

## 2019-09-27 LAB — COMPREHENSIVE METABOLIC PANEL
ALT: 12 U/L (ref 0–44)
AST: 17 U/L (ref 15–41)
Albumin: 2.7 g/dL — ABNORMAL LOW (ref 3.5–5.0)
Alkaline Phosphatase: 42 U/L (ref 38–126)
Anion gap: 8 (ref 5–15)
BUN: 12 mg/dL (ref 8–23)
CO2: 22 mmol/L (ref 22–32)
Calcium: 9 mg/dL (ref 8.9–10.3)
Chloride: 110 mmol/L (ref 98–111)
Creatinine, Ser: 1.17 mg/dL — ABNORMAL HIGH (ref 0.44–1.00)
GFR calc Af Amer: 52 mL/min — ABNORMAL LOW (ref >60)
GFR calc non Af Amer: 45 mL/min — ABNORMAL LOW (ref >60)
Glucose, Bld: 108 mg/dL — ABNORMAL HIGH (ref 70–99)
Potassium: 3.3 mmol/L — ABNORMAL LOW (ref 3.5–5.1)
Sodium: 140 mmol/L (ref 135–145)
Total Bilirubin: 1.2 mg/dL (ref 0.3–1.2)
Total Protein: 5.7 g/dL — ABNORMAL LOW (ref 6.5–8.1)

## 2019-09-27 LAB — LIPID PANEL
Cholesterol: 127 mg/dL (ref 0–200)
HDL: 37 mg/dL — ABNORMAL LOW (ref >40)
LDL Cholesterol: 55 mg/dL (ref 0–99)
Total CHOL/HDL Ratio: 3.4 RATIO
Triglycerides: 173 mg/dL — ABNORMAL HIGH (ref <150)
VLDL: 35 mg/dL (ref 0–40)

## 2019-09-27 LAB — TYPE AND SCREEN
ABO/RH(D): A NEG
Antibody Screen: NEGATIVE
Unit division: 0

## 2019-09-27 LAB — GLUCOSE, CAPILLARY
Glucose-Capillary: 106 mg/dL — ABNORMAL HIGH (ref 70–99)
Glucose-Capillary: 206 mg/dL — ABNORMAL HIGH (ref 70–99)
Glucose-Capillary: 86 mg/dL (ref 70–99)
Glucose-Capillary: 92 mg/dL (ref 70–99)
Glucose-Capillary: 115 mg/dL — ABNORMAL HIGH (ref 70–99)

## 2019-09-27 LAB — KAPPA/LAMBDA LIGHT CHAINS
Kappa free light chain: 42.4 mg/L — ABNORMAL HIGH (ref 3.3–19.4)
Kappa, lambda light chain ratio: 1.46 (ref 0.26–1.65)
Lambda free light chains: 29.1 mg/L — ABNORMAL HIGH (ref 5.7–26.3)

## 2019-09-27 LAB — BPAM RBC
Blood Product Expiration Date: 202103302359
ISSUE DATE / TIME: 202103300144
Unit Type and Rh: 600

## 2019-09-27 LAB — ERYTHROPOIETIN: Erythropoietin: 11.3 m[IU]/mL (ref 2.6–18.5)

## 2019-09-27 LAB — HAPTOGLOBIN: Haptoglobin: 202 mg/dL (ref 42–346)

## 2019-09-27 MED ORDER — METOPROLOL TARTRATE 5 MG/5ML IV SOLN
5.0000 mg | INTRAVENOUS | Status: AC | PRN
  Administered 2019-09-27: 5 mg via INTRAVENOUS
  Filled 2019-09-27: qty 5

## 2019-09-27 MED ORDER — LOSARTAN POTASSIUM 50 MG PO TABS
50.0000 mg | ORAL_TABLET | Freq: Every day | ORAL | Status: DC
  Administered 2019-09-27 – 2019-09-28 (×2): 50 mg via ORAL
  Filled 2019-09-27 (×2): qty 1

## 2019-09-27 MED ORDER — POTASSIUM CHLORIDE CRYS ER 20 MEQ PO TBCR
20.0000 meq | EXTENDED_RELEASE_TABLET | Freq: Once | ORAL | Status: AC
  Administered 2019-09-27: 20 meq via ORAL
  Filled 2019-09-27: qty 1

## 2019-09-27 MED ORDER — HYDRALAZINE HCL 25 MG PO TABS
25.0000 mg | ORAL_TABLET | Freq: Three times a day (TID) | ORAL | Status: DC
  Administered 2019-09-27 – 2019-09-28 (×4): 25 mg via ORAL
  Filled 2019-09-27 (×4): qty 1

## 2019-09-27 NOTE — Progress Notes (Signed)
IP PROGRESS NOTE  Subjective:   She is alert, sitting up to eat breakfast.  Objective: Vital signs in last 24 hours: Blood pressure (!) 155/61, pulse 82, temperature 98.1 F (36.7 C), resp. rate 17, height 5' 2" (1.575 m), weight 119 lb 0.8 oz (54 kg), SpO2 99 %.  Intake/Output from previous day: 03/30 0701 - 03/31 0700 In: 690.7 [P.O.:200; I.V.:490.7] Out: -   Physical Exam:  Neurologic: Confused, disoriented    Lab Results: Recent Labs    09/26/19 0838 09/27/19 0536  WBC 6.2 5.9  HGB 9.7* 10.4*  HCT 30.1* 31.1*  PLT 340 340    BMET Recent Labs    09/26/19 0838 09/27/19 0536  NA 142 140  K 3.1* 3.3*  CL 110 110  CO2 24 22  GLUCOSE 108* 108*  BUN 9 12  CREATININE 1.08* 1.17*  CALCIUM 8.6* 9.0  Erythropoietin level from 09/25/2019: 11.3 No results found for: CEA1  Studies/Results: DG Chest 2 View  Result Date: 09/25/2019 CLINICAL DATA:  Low hemoglobin EXAM: CHEST - 2 VIEW COMPARISON:  CT 08/01/2019 FINDINGS: No consolidation or pleural effusion. Mild cardiomegaly with aortic atherosclerosis. Emphysematous disease. No pneumothorax. Sclerosis in the proximal right humerus may reflect bone infarct or chondroid lesion. IMPRESSION: No active cardiopulmonary disease.  Mild cardiomegaly. Electronically Signed   By: Kim  Fujinaga M.D.   On: 09/25/2019 19:09   MR BRAIN WO CONTRAST  Result Date: 09/25/2019 CLINICAL DATA:  Delirium EXAM: MRI HEAD WITHOUT CONTRAST TECHNIQUE: Multiplanar, multiecho pulse sequences of the brain and surrounding structures were obtained without intravenous contrast. COMPARISON:  Brain MRI 08/03/2019 FINDINGS: Examination is severely degraded by motion. Brain: No acute infarct, acute hemorrhage or extra-axial collection. Diffuse confluent hyperintense T2-weighted signal within the periventricular, deep and juxtacortical white matter, most commonly due to chronic ischemic microangiopathy. There is generalized atrophy without lobar predilection. No  chronic microhemorrhage. Normal midline structures. Vascular: Normal flow voids. Skull and upper cervical spine: Normal marrow signal. Sinuses/Orbits: Negative. Other: None. IMPRESSION: 1. Severely motion degraded study. 2. No acute intracranial abnormality. 3. 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 Electronically Signed   By: Kevin  Herman M.D.   On: 09/25/2019 20:21    Medications: I have reviewed the patient's current medications.  Assessment/Plan: 1.  Normocytic anemia 2.  Elevated indirect bilirubin 3.  Dementia 4.  COPD 5.  Hypertension 6.  CVA February 2021 7.  Renal failure  Ms. Tash is stable from a hematologic standpoint.  The erythropoietin level on 09/25/2019 is inappropriately low.  I suspect the anemia is in part related to chronic renal failure.  She is stable for discharge from a hematologic standpoint.  I will arrange for outpatient follow-up in the hematology clinic for approximately 2 weeks.  I discussed the case with her daughter Laura by telephone.  She indicates Ms. Hepp has experienced a rapid decline in her mental status over the past few months.  She does not want her to undergo aggressive interventions such as a diagnostic bone marrow biopsy.  She would agree to a trial of erythropoietin for progressive anemia.  Recommendations: 1.  Neurologic evaluation if indicated for evaluation of rapid progression of dementia 2.  Outpatient follow-up at the Cancer center in 2 weeks  LOS: 1 day    , MD   09/27/2019, 12:53 PM  

## 2019-09-27 NOTE — Evaluation (Signed)
Physical Therapy Evaluation Patient Details Name: Chloe Fletcher MRN: [id number] DOB: Oct 29, 1940 Today's Date: 09/27/2019   History of Present Illness  Chloe Fletcher is a 79 y.o. female past medical history significant for carotid artery stenosis, CKD stage IIIb, cognitive decline/demetia, COPD, HTN, CVA in February 2021, diabetes mellitus without complication, who presents from [facility] facility due to abnormal lab, patient was found to have low hemoglobin. Patient admitted for anemia.    Clinical Impression  Chloe Fletcher is 79 y.o. female admitted with above HPI and diagnosis. Patient is currently limited by functional impairments below (see PT problem list). Patient lives at [facility] and is mobilizes indepenently at baseline. Patient will benefit from continued skilled PT interventions to address impairments and progress independence with mobility, recommending supervision for mobility and return to ALF. If they cannot provide supervision pt will need SNF placement.. Acute PT will follow and progress as able.     Follow Up Recommendations Other (comment);SNF;Supervision for mobility/OOB(return to ALF with assistance/suprvision for mobility )    Equipment Recommendations  None recommended by PT(pt may benefit from RW, follow up at facility)    Recommendations for Other Services       Precautions / Restrictions Precautions Precautions: Fall Restrictions Weight Bearing Restrictions: No      Mobility  Bed Mobility Overal bed mobility: Modified Independent          General bed mobility comments: pt requires some extra time and HOB elevated.  Transfers Overall transfer level: Needs assistance Equipment used: 1 person hand held assist Transfers: Sit to/from Stand Sit to Stand: Min assist         General transfer comment: light assist for steadying with power up and rise.   Ambulation/Gait Ambulation/Gait assistance: Min assist Gait Distance (Feet): 210 Feet Assistive  device: 1 person hand held assist;None Gait Pattern/deviations: Step-through pattern;Decreased stride length;Narrow base of support Gait velocity: slightly decreased   General Gait Details: pt with lightly unsteady gait with no device and swinging arms in playful manner requiring min assist to steady. Pt able to longer steps with improved step width with 1 HHA on Rt UE.   Stairs     Wheelchair Mobility    Modified Rankin (Stroke Patients Only)       Balance Overall balance assessment: Needs assistance Sitting-balance support: Feet supported Sitting balance-Leahy Scale: Good     Standing balance support: During functional activity;Single extremity supported;No upper extremity supported Standing balance-Leahy Scale: Good            Pertinent Vitals/Pain Pain Assessment: No/denies pain    Home Living Family/patient expects to be discharged to:: Assisted living      Additional Comments: pt admitted from [facility].     Prior Function Level of Independence: Needs assistance               Hand Dominance   Dominant Hand: Right    Extremity/Trunk Assessment   Upper Extremity Assessment Upper Extremity Assessment: Overall WFL for tasks assessed    Lower Extremity Assessment Lower Extremity Assessment: Generalized weakness    Cervical / Trunk Assessment Cervical / Trunk Assessment: Normal  Communication   Communication: HOH  Cognition Arousal/Alertness: Awake/alert Behavior During Therapy: WFL for tasks assessed/performed Overall Cognitive Status: History of cognitive impairments - at baseline         General Comments      Exercises     Assessment/Plan    PT Assessment Patient needs continued PT services  PT Problem List Decreased strength;Decreased  activity tolerance;Decreased balance;Decreased knowledge of use of DME;Decreased safety awareness;Decreased mobility       PT Treatment Interventions Gait training;Functional mobility  training;Therapeutic activities;Therapeutic exercise;Balance training;Patient/family education    PT Goals (Current goals can be found in the Care Plan section)  Acute Rehab PT Goals Patient Stated Goal: none stated PT Goal Formulation: Patient unable to participate in goal setting Time For Goal Achievement: 10/04/19 Potential to Achieve Goals: Good    Frequency Min 2X/week    AM-PAC PT "6 Clicks" Mobility  Outcome Measure Help needed turning from your back to your side while in a flat bed without using bedrails?: None Help needed moving from lying on your back to sitting on the side of a flat bed without using bedrails?: None Help needed moving to and from a bed to a chair (including a wheelchair)?: A Little Help needed standing up from a chair using your arms (e.g., wheelchair or bedside chair)?: A Little Help needed to walk in hospital room?: A Little Help needed climbing 3-5 steps with a railing? : A Little 6 Click Score: 20    End of Session Equipment Utilized During Treatment: Gait belt Activity Tolerance: Patient tolerated treatment well Patient left: in bed;with call bell/phone within reach;with bed alarm set Nurse Communication: Mobility status PT Visit Diagnosis: Muscle weakness (generalized) (M62.81);Unsteadiness on feet (R26.81);Difficulty in walking, not elsewhere classified (R26.2)    Time: [id number] PT Time Calculation (min) (ACUTE ONLY): 19 min   Charges:   PT Evaluation $PT Eval Moderate Complexity: 1 Mod          Gwynneth Albright PT, DPT Physical Therapist with [facility] [phone]  09/27/2019 6:05 PM

## 2019-09-27 NOTE — Progress Notes (Signed)
PROGRESS NOTE    Chloe Fletcher    Code Status: DNR  RN:[id number] DOB: 20-Sep-1940 DOA: 09/25/2019 LOS: 1 days  PCP: [facility] CC:  Chief Complaint  Patient presents with  . Abnormal [place] a 79 y.o.femalepast medical history significant for carotid artery stenosis, CKD stage IIIb cognitive decline, diabetes mellitus without complication, hypertension, who presents from [facility] facility due to abnormal lab, patient was found to have low hemoglobin. Hemoglobin reported at 7.9 from previously 8.9--10.Patient has been noted to be more confused for the last few weeks. Per daughter report, patient has decreased clarity and confusion for the last 2 weeks. Patient is not able to remember to help operate her phone. She was able to call her daughter throughface time and she is not able to do that anymore.Patient had a stroke late December early January, and since then she has been having memory problem but after rehab she improved some. Daughter reports patient has been incontinence of urine. Patient was not able to provide history. Patient was confused on presentation  Evaluation in the ED: Afebrile, sodium 142, potassium 3.4, creatinine 1.5, BUN 13, white blood cell 6, hemoglobin 7.7, platelets 372,fecal occult blood negative.  Hematology/Oncology consulted for anemia and given one unit PRBC. Started workup for Myeloma. Labs unremarkable regarding encephalopathy but noted to be on multiple sedating medications which have since been discontinued.   A & P   Active Problems:   HTN (hypertension)   Anemia   Acute metabolic encephalopathy   AKI (acute kidney injury) ([place])   1. 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 MRI with chronic microvascular changes b. LDL 152 in February, currently on atorvastatin 40 mg c. Check LDL, likely increase  statin for goal LDL<70 d. Other risk factor modification e. Continue Plavix f. Discontinue Ativan g. Requiring telemetry sitter -nurse stated patient is getting up and walking into other rooms h. Discontinue Claritin (inpatient)/Zyrtec (outpatient) i. Needs outpatient neurology follow-up  2. Normocytic anemia a. To follow-up with oncology/hematology in 2 weeks outpatient b. Myeloma work-up pending  3. CKD 3b with proteinuria a. Has been seen by nephrology in the past to restart patient's losartan at half dose b. will restart losartan c. Recommend outpatient follow-up  4. History of CVA with chronic microvascular changes a. Continue Plavix and statin with possible changes as above b. Needs better risk factor modification  5. Hypertension a. Restart Cozaar b. Increase hydralazine to 3 times daily c. Continue metoprolol  6. 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 Diabetes on sliding scale, holding home Metformin  8. Hypokalemia repleted  DVT prophylaxis: Lovenox Family Communication: Patient's son and daughter has been updated  Disposition Plan:   Patient came from:   [place]                                                                                          Anticipated d/c place: [facility]  Barriers to d/c: PT eval, med rec.  Likely discharge tomorrow  Pressure injury documentation    None  Consultants  Hematology/oncology  Procedures  None  Antibiotics   Anti-infectives (From admission, onward)   None        Subjective   Patient seen and examined at bedside in no acute distress and resting comfortably. No acute events overnight. Denies any acute complaints at this time.  Tolerating diet well.    Objective   Vitals:   09/27/19 0432 09/27/19 0631 09/27/19 0808 09/27/19 1346  BP: (!) 195/65 (!) 155/61  (!) 167/64  Pulse: 93 82  69  Resp: 17   16  Temp: 98.1 F (36.7 C)   98.3 F (36.8 C)  TempSrc:      SpO2: 97%  99% 98%  Weight:      Height:          Intake/Output Summary (Last 24 hours) at 09/27/2019 1359 Last data filed at 09/27/2019 1032 Gross per 24 hour  Intake 1062.67 ml  Output 125 ml  Net 937.67 ml   Filed Weights   09/25/19 1352  Weight: 54 kg    Examination:  Physical Exam Vitals and nursing note reviewed.  Constitutional:      Appearance: Normal appearance.  HENT:     Head: Normocephalic and atraumatic.  Eyes:     Conjunctiva/sclera: Conjunctivae normal.  Cardiovascular:     Rate and Rhythm: Normal rate and regular rhythm.  Pulmonary:     Effort: Pulmonary effort is normal.     Breath sounds: Normal breath sounds.  Abdominal:     General: Abdomen is flat.     Palpations: Abdomen is soft.  Musculoskeletal:        General: No swelling or tenderness.  Skin:    Coloration: Skin is not jaundiced or pale.  Neurological:     Mental Status: She is alert. Mental status is at baseline.     Comments: Oriented to person not time or place. Seems at baseline dementia  Psychiatric:        Mood and Affect: Mood normal.        Behavior: Behavior normal.     Data Reviewed: I have personally reviewed following labs and imaging studies  CBC: Recent Labs  Lab 09/25/19 1403 09/26/19 0838 09/27/19 0536  WBC 6.0 6.2 5.9  NEUTROABS 3.9  --   --   HGB 7.7* 9.7* 10.4*  HCT 24.1* 30.1* 31.1*  MCV 94.5 91.5 91.2  PLT 372 340 [id number]   Basic Metabolic Panel: Recent Labs  Lab 09/25/19 1403 09/26/19 0838 09/27/19 0536  NA 142 142 140  K 3.4* 3.1* 3.3*  CL 108 110 110  CO2 26 24 22   GLUCOSE 97 108* 108*  BUN 13 9 12   CREATININE 1.52* 1.08* 1.17*  CALCIUM 8.9 8.6* 9.0   GFR: Estimated Creatinine Clearance: 31.3 mL/min (A) (by C-G formula based on SCr of 1.17 mg/dL (H)). Liver Function Tests: Recent Labs  Lab 09/26/19 0838 09/26/19 1210 09/27/19 0536  AST 19  --  17  ALT 11  --  12  ALKPHOS 43  --  42  BILITOT 2.6* 3.2* 1.2  PROT 5.7*  --  5.7*  ALBUMIN 2.7*  --  2.7*   No results for input(s):  LIPASE, AMYLASE in the last 168 hours. Recent Labs  Lab 09/25/19 2051  AMMONIA 17   Coagulation Profile: No results for input(s): INR, PROTIME in the last 168 hours. Cardiac Enzymes: No results for input(s): CKTOTAL, CKMB, CKMBINDEX, TROPONINI in the last 168 hours. BNP (last 3 results) No results for input(s): PROBNP  in the last 8760 hours. HbA1C: No results for input(s): HGBA1C in the last 72 hours. CBG: Recent Labs  Lab 09/26/19 1213 09/26/19 1700 09/26/19 2116 09/27/19 0754 09/27/19 1148  GLUCAP 101* 99 102* 106* 206*   Lipid Profile: No results for input(s): CHOL, HDL, LDLCALC, TRIG, CHOLHDL, LDLDIRECT in the last 72 hours. Thyroid Function Tests: Recent Labs    09/25/19 1730  TSH 0.804   Anemia Panel: Recent Labs    09/25/19 1403 09/25/19 1730  VITAMINB12  --  335  FOLATE  --  9.5  FERRITIN  --  25  TIBC  --  308  IRON  --  38  RETICCTPCT 1.4  --    Sepsis Labs: No results for input(s): PROCALCITON, LATICACIDVEN in the last 168 hours.  Recent Results (from the past 240 hour(s))  SARS CORONAVIRUS 2 (TAT 6-24 HRS) Nasopharyngeal Nasopharyngeal Swab     Status: None   Collection Time: 09/25/19  5:41 PM   Specimen: Nasopharyngeal Swab  Result Value Ref Range Status   SARS Coronavirus 2 NEGATIVE NEGATIVE Final    Comment: (NOTE) SARS-CoV-2 target nucleic acids are NOT DETECTED. The SARS-CoV-2 RNA is generally detectable in upper and lower respiratory specimens during the acute phase of infection. Negative results do not preclude SARS-CoV-2 infection, do not rule out co-infections with other pathogens, and should not be used as the sole basis for treatment or other patient management decisions. Negative results must be combined with clinical observations, patient history, and epidemiological information. The expected result is Negative. Fact Sheet for Patients: SugarRoll.be Fact Sheet for Healthcare  Providers: https://www.woods-mathews.com/ This test is not yet approved or cleared by the [place] FDA and  has been authorized for detection and/or diagnosis of SARS-CoV-2 by FDA under an Emergency Use Authorization (EUA). This EUA will remain  in effect (meaning this test can be used) for the duration of the COVID-19 declaration under Section 56 4(b)(1) of the Act, 21 U.S.C. section 360bbb-3(b)(1), unless the authorization is terminated or revoked sooner. Performed at [facility], [place] [address], [place], [place] [address]   Urine Culture     Status: None   Collection Time: 09/25/19  6:24 PM   Specimen: Urine, Clean Catch  Result Value Ref Range Status   Specimen Description   Final    URINE, CLEAN CATCH Performed at [facility], [place] [address], [place], [place] [address]    Special Requests   Final    NONE Performed at [facility], [place] [address] [place], [place] [address]    Culture   Final    NO GROWTH Performed at [facility], [place] [address], [place], [place] [address]    Report Status 09/26/2019 FINAL  Final         Radiology Studies: DG Chest 2 View  Result Date: 09/25/2019 CLINICAL DATA:  Low hemoglobin EXAM: CHEST - 2 VIEW COMPARISON:  CT 08/01/2019 FINDINGS: No consolidation or pleural effusion. Mild cardiomegaly with aortic atherosclerosis. Emphysematous disease. No pneumothorax. Sclerosis in the proximal right humerus may reflect bone infarct or chondroid lesion. IMPRESSION: No active cardiopulmonary disease.  Mild cardiomegaly. Electronically Signed   By: Donavan Foil M.D.   On: 09/25/2019 19:09   MR BRAIN WO CONTRAST  Result Date: 09/25/2019 CLINICAL DATA:  Delirium EXAM: MRI HEAD WITHOUT CONTRAST TECHNIQUE: Multiplanar, multiecho pulse sequences of the brain and surrounding structures were obtained without intravenous contrast. COMPARISON:  Brain MRI 08/03/2019 FINDINGS: Examination is  severely degraded by motion. Brain: No acute infarct, acute hemorrhage or extra-axial collection. Diffuse confluent hyperintense T2-weighted signal within the periventricular, deep and juxtacortical white matter, most commonly due to chronic ischemic microangiopathy. There is generalized atrophy without lobar predilection. No chronic microhemorrhage. Normal midline structures. Vascular: Normal flow voids. Skull and upper cervical spine: Normal marrow signal. Sinuses/Orbits: Negative. Other: None. IMPRESSION: 1. Severely motion degraded study. 2. No acute intracranial abnormality. 3. 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 Electronically Signed   By: Ulyses Jarred M.D.   On: 09/25/2019 20:21        Scheduled Meds: . sodium chloride   Intravenous Once  . amLODipine  10 mg Oral Daily  . atorvastatin  40 mg Oral Daily  . clopidogrel  75 mg Oral Daily  . enoxaparin (LOVENOX) injection  40 mg Subcutaneous Q24H  . ferrous sulfate  325 mg Oral Q breakfast  . fluticasone furoate-vilanterol  1 puff Inhalation Daily  . hydrALAZINE  25 mg Oral TID  . insulin aspart  0-6 Units Subcutaneous TID WC  . metoprolol tartrate  50 mg Oral Daily  . omega-3 acid ethyl esters  1 g Oral Daily  . vitamin B-12  1,000 mcg Oral Daily   Continuous Infusions: . sodium chloride 20 mL/hr at 09/25/19 1428     Time spent: 25 minutes with over 50% of the time coordinating the patient's care    Harold Hedge, DO [facility] Pager [phone]  Call night coverage person covering after 7pm

## 2019-09-28 ENCOUNTER — Telehealth: Payer: Self-pay | Admitting: Nurse Practitioner

## 2019-09-28 LAB — CBC
HCT: 29.5 % — ABNORMAL LOW (ref 36.0–46.0)
Hemoglobin: 9.6 g/dL — ABNORMAL LOW (ref 12.0–15.0)
MCH: 30 pg (ref 26.0–34.0)
MCHC: 32.5 g/dL (ref 30.0–36.0)
MCV: 92.2 fL (ref 80.0–100.0)
Platelets: 325 10*3/uL (ref 150–400)
RBC: 3.2 MIL/uL — ABNORMAL LOW (ref 3.87–5.11)
RDW: 14.6 % (ref 11.5–15.5)
WBC: 5.9 10*3/uL (ref 4.0–10.5)
nRBC: 0 % (ref 0.0–0.2)

## 2019-09-28 LAB — GLUCOSE, CAPILLARY
Glucose-Capillary: 89 mg/dL (ref 70–99)
Glucose-Capillary: 292 mg/dL — ABNORMAL HIGH (ref 70–99)
Glucose-Capillary: 94 mg/dL (ref 70–99)

## 2019-09-28 LAB — MRSA PCR SCREENING: MRSA by PCR: NEGATIVE

## 2019-09-28 LAB — BASIC METABOLIC PANEL
Anion gap: 6 (ref 5–15)
BUN: 15 mg/dL (ref 8–23)
CO2: 22 mmol/L (ref 22–32)
Calcium: 8.9 mg/dL (ref 8.9–10.3)
Chloride: 109 mmol/L (ref 98–111)
Creatinine, Ser: 1.41 mg/dL — ABNORMAL HIGH (ref 0.44–1.00)
GFR calc Af Amer: 41 mL/min — ABNORMAL LOW (ref >60)
GFR calc non Af Amer: 36 mL/min — ABNORMAL LOW (ref >60)
Glucose, Bld: 100 mg/dL — ABNORMAL HIGH (ref 70–99)
Potassium: 3.6 mmol/L (ref 3.5–5.1)
Sodium: 137 mmol/L (ref 135–145)

## 2019-09-28 LAB — MULTIPLE MYELOMA PANEL, SERUM
Albumin SerPl Elph-Mcnc: 3.1 g/dL (ref 2.9–4.4)
Albumin/Glob SerPl: 1.1 (ref 0.7–1.7)
Alpha 1: 0.3 g/dL (ref 0.0–0.4)
Alpha2 Glob SerPl Elph-Mcnc: 1 g/dL (ref 0.4–1.0)
B-Globulin SerPl Elph-Mcnc: 1.2 g/dL (ref 0.7–1.3)
Gamma Glob SerPl Elph-Mcnc: 0.7 g/dL (ref 0.4–1.8)
Globulin, Total: 3.1 g/dL (ref 2.2–3.9)
IgA: 344 mg/dL (ref 64–422)
IgG (Immunoglobin G), Serum: 746 mg/dL (ref 586–1602)
IgM (Immunoglobulin M), Srm: 73 mg/dL (ref 26–217)
Total Protein ELP: 6.2 g/dL (ref 6.0–8.5)

## 2019-09-28 MED ORDER — ENOXAPARIN SODIUM 30 MG/0.3ML ~~LOC~~ SOLN: 30.0000 mg | SUBCUTANEOUS | Status: DC

## 2019-09-28 MED ORDER — LOSARTAN POTASSIUM 50 MG PO TABS: 50.0000 mg | ORAL_TABLET | Freq: Every day | ORAL | 0 refills | Status: AC

## 2019-09-28 NOTE — Discharge Summary (Addendum)
Physician Discharge Summary  Chloe Fletcher [id number] DOB: [birth year]   PCP: Orvis Brill, [facility]  Admit date: 09/25/2019 Discharge date: 09/28/2019 Length of Stay: 2 days   Code Status: DNR  Admitted From: [facility] Discharged to:  ALF Home Health:  None  Equipment/Devices:  None Discharge Condition:  Stable  Recommendations for Outpatient Follow-up   1. Follow up with PCP in 1 week 2. Follow up with hematology/oncology at cancer center in 2 weeks. Follow up on Myeloma labs 3. Follow up with neurology outpatient for progressive dementia 4. Recommend palliative referral outpatient  5. Consider outpatient nephrology follow up for Gilberton Wrennis a 79 y.o.femalepast medical history significant for carotid artery stenosis, CKD stage IIIb cognitive decline, diabetes mellitus without complication, hypertension, who presents from [facility] facility due to abnormal lab, patient was found to have low hemoglobin. Hemoglobin reported at 7.9 from previously 8.9--10.Patient has been noted to be more confused for the last few weeks. Per daughter report, patient has decreased clarity and confusion for the last 2 weeks. Patient is not able to remember to help operate her phone. She was able to call her daughter throughface time and she is not able to do that anymore.Patient had a stroke late December early January, and since then she has been having memory problem but after rehab she improved some. Daughter reports patient has been incontinence of urine. Patient was not able to provide history. Patient was confused on presentation  Evaluation in the ED: Afebrile, sodium 142, potassium 3.4, creatinine 1.5, BUN 13, white blood cell 6, hemoglobin 7.7, platelets 372,fecal occult blood negative.  Hematology/Oncology consulted for anemia and given one unit PRBC. Started workup for Myeloma. Labs unremarkable regarding encephalopathy but noted to be on  multiple sedating medications which have since been discontinued including ativan and antihistamine. Patient had improvement/resolution of her encephalopathy.   Discharged in stable condition with recommendations for outpatient folow up  A & P   Active Problems:   HTN (hypertension)   Anemia   Acute metabolic encephalopathy   AKI (acute kidney injury) ([place])    1. 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 MRI with chronic microvascular changes b. LDL 152 in February, currently on atorvastatin 40 mg, repeat LDL 52. Continue home statin for goal LDL<70 c. Further outpatient risk factor modification d. Continue Plavix e. Discontinued Ativan, Discontinue Claritin (inpatient)/Zyrtec (outpatient) f. Needs outpatient neurology follow-up  2. Normocytic anemia s/p 1u PRBC stable a. To follow-up with oncology/hematology in 2 weeks outpatient b. Myeloma work-up pending  3. CKD 3b with proteinuria a. Has been seen by nephrology in the past.  b. will restart losartan at decreased dosing c. Recommend outpatient follow-up  4. History of CVA with chronic microvascular changes a. Continue Plavix and statin and outpatient follow up  5. Hypertension a. Restart Cozaar b. Continue increased hydralazine to 3 times daily c. Continue metoprolol  6. 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 Diabetes restart home Metformin  8. Hypokalemia repleted    Consultants  . Hematology/oncology  Procedures  . 1 u PRBC  Antibiotics   Anti-infectives (From admission, onward)   None       Subjective  Patient seen and examined at bedside no acute distress and resting comfortably.  No events overnight.  Tolerating diet. In good spirits and anticipating discharge.   Denies any chest pain, shortness of breath, fever, nausea, vomiting, urinary or bowel complaints. Otherwise ROS negative  Objective    Discharge Exam: Vitals:   09/28/19 0752 09/28/19 0912  BP:  (!) 160/87  Pulse:  95  Resp:  17  Temp:  98.3 F (36.8 C)  SpO2: 99% 99%   Vitals:   09/27/19 2327 09/28/19 0326 09/28/19 0752 09/28/19 0912  BP: (!) 158/62 (!) 179/61  (!) 160/87  Pulse: 76 82  95  Resp: 19 18  17   Temp: 98 F (36.7 C) 98.4 F (36.9 C)  98.3 F (36.8 C)  TempSrc: Oral Oral  Oral  SpO2: 98% 98% 99% 99%  Weight:      Height:        Physical Exam Vitals and nursing note reviewed.  Constitutional:      Appearance: Normal appearance.  HENT:     Head: Normocephalic and atraumatic.  Eyes:     Conjunctiva/sclera: Conjunctivae normal.  Cardiovascular:     Rate and Rhythm: Normal rate and regular rhythm.  Pulmonary:     Effort: Pulmonary effort is normal.     Breath sounds: Normal breath sounds.  Abdominal:     General: Abdomen is flat.     Palpations: Abdomen is soft.  Musculoskeletal:        General: No swelling or tenderness.  Skin:    Coloration: Skin is not jaundiced or pale.  Neurological:     Mental Status: She is alert. Mental status is at baseline.  Psychiatric:        Mood and Affect: Mood normal.        Behavior: Behavior normal.       The results of significant diagnostics from this hospitalization (including imaging, microbiology, ancillary and laboratory) are listed below for reference.     Microbiology: Recent Results (from the past 240 hour(s))  SARS CORONAVIRUS 2 (TAT 6-24 HRS) Nasopharyngeal Nasopharyngeal Swab     Status: None   Collection Time: 09/25/19  5:41 PM   Specimen: Nasopharyngeal Swab  Result Value Ref Range Status   SARS Coronavirus 2 NEGATIVE NEGATIVE Final    Comment: (NOTE) SARS-CoV-2 target nucleic acids are NOT DETECTED. The SARS-CoV-2 RNA is generally detectable in upper and lower respiratory specimens during the acute phase of infection. Negative results do not preclude SARS-CoV-2 infection, do not rule out co-infections with other  pathogens, and should not be used as the sole basis for treatment or other patient management decisions. Negative results must be combined with clinical observations, patient history, and epidemiological information. The expected result is Negative. Fact Sheet for Patients: SugarRoll.be Fact Sheet for Healthcare Providers: https://www.woods-mathews.com/ This test is not yet approved or cleared by the [place] FDA and  has been authorized for detection and/or diagnosis of SARS-CoV-2 by FDA under an Emergency Use Authorization (EUA). This EUA will remain  in effect (meaning this test can be used) for the duration of the COVID-19 declaration under Section 56 4(b)(1) of the Act, 21 U.S.C. section 360bbb-3(b)(1), unless the authorization is terminated or revoked sooner. Performed at [facility], [place] [address], [place], [place] [address]   Urine Culture     Status: None   Collection Time: 09/25/19  6:24 PM   Specimen: Urine, Clean Catch  Result Value Ref Range Status   Specimen Description   Final    URINE, CLEAN CATCH Performed at [facility], [place] [address], [place], [place] [address]    Special Requests   Final    NONE Performed at [facility], [place] [address]  [place], [place] [address]    Culture   Final    NO GROWTH Performed at [facility], [place] [address] [place], [place] [address]    Report Status 09/26/2019 FINAL  Final  MRSA PCR Screening     Status: None   Collection Time: 09/26/19  2:07 AM   Specimen: Nasopharyngeal  Result Value Ref Range Status   MRSA by PCR NEGATIVE NEGATIVE Final    Comment:        The GeneXpert MRSA Assay (FDA approved for NASAL specimens only), is one component of a comprehensive MRSA colonization surveillance program. It is not intended to diagnose MRSA infection nor to guide or monitor treatment for MRSA infections. Performed at [facility] [place] [address], [place], [place] [address]      Labs: BNP (last 3 results) No results for input(s): BNP in the last 8760 hours. Basic Metabolic Panel: Recent Labs  Lab 09/25/19 1403 09/26/19 0838 09/27/19 0536 09/28/19 0516  NA 142 142 140 137  K 3.4* 3.1* 3.3* 3.6  CL 108 110 110 109  CO2 26 24 22 22   GLUCOSE 97 108* 108* 100*  BUN 13 9 12 15   CREATININE 1.52* 1.08* 1.17* 1.41*  CALCIUM 8.9 8.6* 9.0 8.9   Liver Function Tests: Recent Labs  Lab 09/26/19 0838 09/26/19 1210 09/27/19 0536  AST 19  --  17  ALT 11  --  12  ALKPHOS 43  --  42  BILITOT 2.6* 3.2* 1.2  PROT 5.7*  --  5.7*  ALBUMIN 2.7*  --  2.7*   No results for input(s): LIPASE, AMYLASE in the last 168 hours. Recent Labs  Lab 09/25/19 2051  AMMONIA 17   CBC: Recent Labs  Lab 09/25/19 1403 09/26/19 0838 09/27/19 0536 09/28/19 0516  WBC 6.0 6.2 5.9 5.9  NEUTROABS 3.9  --   --   --   HGB 7.7* 9.7* 10.4* 9.6*  HCT 24.1* 30.1* 31.1* 29.5*  MCV 94.5 91.5 91.2 92.2  PLT 372 340 340 325   Cardiac Enzymes: No results for input(s): CKTOTAL, CKMB, CKMBINDEX, TROPONINI in the last 168 hours. BNP: Invalid input(s): POCBNP CBG: Recent Labs  Lab 09/27/19 1652 09/27/19 1656 09/27/19 2143 09/28/19 0747 09/28/19 1121  GLUCAP 86 92 115* 89 292*   D-Dimer No results for input(s): DDIMER in the last 72 hours. Hgb A1c No results for input(s): HGBA1C in the last 72 hours. Lipid Profile Recent Labs    09/27/19 0536  CHOL 127  HDL 37*  LDLCALC 55  TRIG 173*  CHOLHDL 3.4   Thyroid function studies Recent Labs    09/25/19 1730  TSH 0.804   Anemia work up Recent Labs    09/25/19 1403 09/25/19 1730  VITAMINB12  --  335  FOLATE  --  9.5  FERRITIN  --  25  TIBC  --  308  IRON  --  38  RETICCTPCT 1.4  --    Urinalysis    Component Value Date/Time   COLORURINE STRAW (A) 09/25/2019 1734   APPEARANCEUR CLEAR 09/25/2019 1734   LABSPEC 1.007 09/25/2019 1734    PHURINE 7.0 09/25/2019 1734   GLUCOSEU NEGATIVE 09/25/2019 1734   HGBUR SMALL (A) 09/25/2019 1734   BILIRUBINUR NEGATIVE 09/25/2019 1734   KETONESUR NEGATIVE 09/25/2019 1734   PROTEINUR >=300 (A) 09/25/2019 1734   NITRITE NEGATIVE 09/25/2019 1734   LEUKOCYTESUR NEGATIVE 09/25/2019 1734   Sepsis Labs Invalid input(s): PROCALCITONIN,  WBC,  [place] Microbiology  Recent Results (from the past 240 hour(s))  SARS CORONAVIRUS 2 (TAT 6-24 HRS) Nasopharyngeal Nasopharyngeal Swab     Status: None   Collection Time: 09/25/19  5:41 PM   Specimen: Nasopharyngeal Swab  Result Value Ref Range Status   SARS Coronavirus 2 NEGATIVE NEGATIVE Final    Comment: (NOTE) SARS-CoV-2 target nucleic acids are NOT DETECTED. The SARS-CoV-2 RNA is generally detectable in upper and lower respiratory specimens during the acute phase of infection. Negative results do not preclude SARS-CoV-2 infection, do not rule out co-infections with other pathogens, and should not be used as the sole basis for treatment or other patient management decisions. Negative results must be combined with clinical observations, patient history, and epidemiological information. The expected result is Negative. Fact Sheet for Patients: SugarRoll.be Fact Sheet for Healthcare Providers: https://www.woods-mathews.com/ This test is not yet approved or cleared by the [place] FDA and  has been authorized for detection and/or diagnosis of SARS-CoV-2 by FDA under an Emergency Use Authorization (EUA). This EUA will remain  in effect (meaning this test can be used) for the duration of the COVID-19 declaration under Section 56 4(b)(1) of the Act, 21 U.S.C. section 360bbb-3(b)(1), unless the authorization is terminated or revoked sooner. Performed at [facility], [place] [address], [place], [place] [address]   Urine Culture     Status: None   Collection Time: 09/25/19  6:24 PM   Specimen:  Urine, Clean Catch  Result Value Ref Range Status   Specimen Description   Final    URINE, CLEAN CATCH Performed at [facility], [place] [address], [place], [place] [address]    Special Requests   Final    NONE Performed at [facility], [place] [address] [place], [place] [address]    Culture   Final    NO GROWTH Performed at [facility], [place] [address], [place], [place] [address]    Report Status 09/26/2019 FINAL  Final  MRSA PCR Screening     Status: None   Collection Time: 09/26/19  2:07 AM   Specimen: Nasopharyngeal  Result Value Ref Range Status   MRSA by PCR NEGATIVE NEGATIVE Final    Comment:        The GeneXpert MRSA Assay (FDA approved for NASAL specimens only), is one component of a comprehensive MRSA colonization surveillance program. It is not intended to diagnose MRSA infection nor to guide or monitor treatment for MRSA infections. Performed at [facility], [place] [address], [place], [place] [address]     Discharge Instructions     Discharge Instructions    Diet - low sodium heart healthy   Complete by: As directed    Diet - low sodium heart healthy   Complete by: As directed    Discharge instructions   Complete by: As directed    You were seen and examined in the hospital for confusion and cared for by a hospitalist.   Upon Discharge:  - stop ativan (lorazepam) - Stop claritin/zyrtec, do not use benadryl - Start Hydralazine three times daily - Start Iron supplements - Follow up with the hematologist in two weeks - make an appointment with your primary care physician within 7 days - Get lab work prior to your follow up appointment with your PCP Bring all home medications to your appointment to review Request that your primary physician go over all hospital tests and procedures/radiological results at the follow up.   Please get all  hospital records sent to your physician by signing a  hospital release before you go home.   Read the complete instructions along with all the possible side effects for all the medicines you take and that have been prescribed to you. Take any new medicines after you have completely understood and accept all the possible adverse reactions/side effects.   If you have any questions about your discharge medications or the care you received while you were in the hospital, you can call the unit and asked to speak with the hospitalist on call. Once you are discharged, your primary care physician will handle any further medical issues. Please note that NO REFILLS for any discharge medications will be authorized, as it is imperative that you return to your primary care physician (or establish a relationship with a primary care physician if you do not have one) for your aftercare needs so that they can reassess your need for medications and monitor your lab values.   Do not drive, operate heavy machinery, perform activities at heights, swimming or participation in water activities or provide baby sitting services if your were admitted for loss of consciousness/seizures or if you are on sedating medications including, but not limited to benzodiazepines, sleep medications, narcotic pain medications, etc., until you have been cleared to do so by a medical doctor.   Do not take more than prescribed medications.   Wear a seat belt while driving.  If you have smoked or chewed Tobacco in the last 2 years please stop smoking; also stop any regular Alcohol and/or any Recreational drug use including marijuana.  If you experience worsening of your admission symptoms or develop shortness of breath, chest pain, suicidal or homicidal thoughts or experience a life threatening emergency, you must seek medical attention immediately by calling 911 or calling your PCP immediately.   Increase activity slowly   Complete by: As directed    Increase activity slowly   Complete by:  As directed      Allergies as of 09/28/2019   No Known Allergies     Medication List    STOP taking these medications   LORazepam 0.5 MG tablet Commonly known as: ATIVAN     TAKE these medications   acetaminophen 500 MG tablet Commonly known as: TYLENOL Take 500 mg by mouth every 8 (eight) hours as needed for mild pain or headache.   amLODipine 10 MG tablet Commonly known as: NORVASC Take 1 tablet (10 mg total) by mouth daily.   atorvastatin 40 MG tablet Commonly known as: LIPITOR Take 1 tablet (40 mg total) by mouth daily at 6 PM. What changed: when to take this   Breo Ellipta 100-25 MCG/INH Aepb Generic drug: fluticasone furoate-vilanterol Inhale 1 puff into the lungs daily.   cetirizine 10 MG tablet Commonly known as: ZYRTEC Take 10 mg by mouth daily.   clopidogrel 75 MG tablet Commonly known as: PLAVIX Take 75 mg by mouth daily.   cyanocobalamin 1000 MCG tablet Take 1 tablet (1,000 mcg total) by mouth daily.   fenofibrate 48 MG tablet Commonly known as: TRICOR Take 48 mg by mouth daily.   ferrous sulfate 325 (65 FE) MG tablet Take 1 tablet (325 mg total) by mouth daily with breakfast.   Fish Oil 1000 MG Caps Take 1,000 mg by mouth daily.   GLUCOSAMINE 1500 COMPLEX PO Take 1,500 mg by mouth in the morning and at bedtime.   hydrALAZINE 25 MG tablet Commonly known as: APRESOLINE Take 1 tablet (25 mg  total) by mouth 2 (two) times daily.   losartan 50 MG tablet Commonly known as: COZAAR Take 1 tablet (50 mg total) by mouth daily. What changed: when to take this   metFORMIN 500 MG tablet Commonly known as: GLUCOPHAGE Take 500 mg by mouth daily with breakfast.   metoprolol tartrate 50 MG tablet Commonly known as: LOPRESSOR Take 50 mg by mouth daily.   multivitamin with minerals Tabs tablet Take 1 tablet by mouth daily.   Vitamin D3 25 MCG (1000 UT) Caps Take 1,000 Units by mouth daily.       No Known Allergies  Time coordinating  discharge: Over 30 minutes    SIGNED:   Harold Hedge, D.O. [facility] Pager: [phone]  09/28/2019, 1:49 PM

## 2019-09-28 NOTE — Telephone Encounter (Signed)
Scheduled appt per 4/1 sch message - unable to reach pt . Left message with appt date and time

## 2019-09-28 NOTE — Progress Notes (Signed)
Discussed discharge with son, [name].   SW sent DC summary to [place].   RN transported patient to family car.      [place], Therapist, sports

## 2019-09-28 NOTE — TOC Transition Note (Addendum)
Transition of Care [facility]) - CM/SW Discharge Note   Patient Details  Name: Chloe Fletcher MRN: [id number] Date of Birth: 03-24-1941  Transition of [facility]) CM/SW Contact:  Trish Mage, LCSW Phone Number: 09/28/2019, 9:49 AM   Clinical Narrative:   Patient to return to [facility] today.  Spoke to Miner, who requests PT notes, FL2 and d/c summary to be FAXed to her for review.  She will then get back to me with with any questions or concerns before giving green light for return. TOC will continue to follow during the course of hospitalization.  Addendum:  JJ approved patient return after reviewing paperwork. Son will provide transportation.   Nursing, please call report to [phone], room 41A.  TOC sign off.     Final next level of care: Assisted Living Barriers to Discharge: No Barriers Identified   Patient Goals and CMS Choice        Discharge Placement                       Discharge Plan and Services                                     Social Determinants of Health (SDOH) Interventions     Readmission Risk Interventions Readmission Risk Prevention Plan 08/10/2019  Transportation Screening Complete  PCP or Specialist Appt within 5-7 Days Not Complete  Not Complete comments Pt going to SNF  Home Care Screening Not Complete  Home Care Screening Not Completed Comments Pt going to SNF  Medication Review (RN CM) Complete

## 2019-09-28 NOTE — NC FL2 (Signed)
[place] LEVEL OF CARE SCREENING TOOL     IDENTIFICATION  Patient Name: Chloe Fletcher Birthdate: [birth year] Sex: female Admission Date (Current Location): 09/25/2019  [facility] and [place] Number:  Herbalist and Address:  [facility],  [place] [address], [place]      Provider Number: [id number]  Attending Physician Name and Address:  Harold Hedge, MD  Relative Name and Phone Number:       Current Level of Care: Hospital Recommended Level of Care: [place] Prior Approval Number:    Date Approved/Denied:   PASRR Number:    Discharge Plan: Domiciliary (Rest home)([place] [facility])    Current Diagnoses: Patient Active Problem List   Diagnosis Date Noted  . Anemia 09/25/2019  . Acute metabolic encephalopathy 99991111  . AKI (acute kidney injury) ([place]) 09/25/2019  . HTN (hypertension) 08/02/2019  . Acute cystitis 08/02/2019  . Hypertensive urgency 08/02/2019  . Diabetes mellitus without complication ([place])   . CKD (chronic kidney disease) stage 3, GFR 30-59 ml/min   . COPD (chronic obstructive pulmonary disease) ([facility])     Orientation RESPIRATION BLADDER Height & Weight     Self  Normal Continent Weight: 54 kg Height:  5\' 2"  (157.5 cm)  BEHAVIORAL SYMPTOMS/MOOD NEUROLOGICAL BOWEL NUTRITION STATUS  (none) (none) Continent Diet(see d/c summary)  AMBULATORY STATUS COMMUNICATION OF NEEDS Skin   Supervision Verbally Normal                       Personal Care Assistance Level of Assistance  Bathing, Feeding, Dressing Bathing Assistance: Limited assistance Feeding assistance: Independent Dressing Assistance: Independent     Functional Limitations Info  Sight, Hearing, Speech Sight Info: Adequate Hearing Info: Adequate Speech Info: Adequate    SPECIAL CARE FACTORS FREQUENCY                       Contractures      Additional Factors Info  Code Status, Allergies Code Status Info:  DNR Allergies Info: NKA           Current Medications (09/28/2019):  This is the current hospital active medication list   Discharge Medications:   acetaminophen 500 MG tablet Commonly known as: TYLENOL Take 500 mg by mouth every 8 (eight) hours as needed for mild pain or headache.          amLODipine 10 MG tablet Commonly known as: NORVASC Take 1 tablet (10 mg total) by mouth daily.          atorvastatin 40 MG tablet Commonly known as: LIPITOR Take 1 tablet (40 mg total) by mouth daily at 6 PM.          Breo Ellipta 100-25 MCG/INH Aepb Inhale 1 puff into the lungs daily. Generic drug: fluticasone furoate-vilanterol          cetirizine 10 MG tablet Commonly known as: ZYRTEC Take 10 mg by mouth daily.          clopidogrel 75 MG tablet Commonly known as: PLAVIX Take 75 mg by mouth daily.          cyanocobalamin 1000 MCG tablet Take 1 tablet (1,000 mcg total) by mouth daily.          fenofibrate 48 MG tablet Commonly known as: TRICOR Take 48 mg by mouth daily.          ferrous sulfate 325 (65 FE) MG tablet Take 1 tablet (325 mg total)  by mouth daily with breakfast.          Fish Oil 1000 MG Caps Take 1,000 mg by mouth daily.          GLUCOSAMINE 1500 COMPLEX PO Take 1,500 mg by mouth in the morning and at bedtime.          hydrALAZINE 25 MG tablet Commonly known as: APRESOLINE Take 1 tablet (25 mg total) by mouth 2 (two) times daily.          losartan 50 MG tablet Commonly known as: COZAAR Take 1 tablet (50 mg total) by mouth daily.          metFORMIN 500 MG tablet Commonly known as: GLUCOPHAGE Take 500 mg by mouth daily with breakfast.          metoprolol tartrate 50 MG tablet Commonly known as: LOPRESSOR Take 50 mg by mouth daily.          multivitamin with minerals Tabs tablet Take 1 tablet by mouth daily.          Vitamin D3 25 MCG (1000 UT) Caps Take 1,000 Units by mouth daily.                Relevant Imaging Results:  Relevant Lab  Results:   Additional Information [id number] [place], [place]

## 2019-09-30 ENCOUNTER — Other Ambulatory Visit: Payer: Self-pay

## 2019-09-30 ENCOUNTER — Emergency Department (HOSPITAL_COMMUNITY): Payer: Medicare Other

## 2019-09-30 ENCOUNTER — Emergency Department (HOSPITAL_COMMUNITY)
Admission: EM | Admit: 2019-09-30 | Discharge: 2019-09-30 | Disposition: A | Discharge status: Skilled Nursing Facility | Payer: Medicare Other | Attending: Emergency Medicine | Admitting: Emergency Medicine

## 2019-09-30 ENCOUNTER — Encounter (HOSPITAL_COMMUNITY): Payer: Self-pay | Admitting: *Deleted

## 2019-09-30 DIAGNOSIS — N183 Chronic kidney disease, stage 3 unspecified: Secondary | ICD-10-CM | POA: Insufficient documentation

## 2019-09-30 DIAGNOSIS — F17210 Nicotine dependence, cigarettes, uncomplicated: Secondary | ICD-10-CM | POA: Insufficient documentation

## 2019-09-30 DIAGNOSIS — J449 Chronic obstructive pulmonary disease, unspecified: Secondary | ICD-10-CM | POA: Insufficient documentation

## 2019-09-30 DIAGNOSIS — I129 Hypertensive chronic kidney disease with stage 1 through stage 4 chronic kidney disease, or unspecified chronic kidney disease: Secondary | ICD-10-CM | POA: Diagnosis not present

## 2019-09-30 DIAGNOSIS — Z79899 Other long term (current) drug therapy: Secondary | ICD-10-CM | POA: Insufficient documentation

## 2019-09-30 DIAGNOSIS — E1122 Type 2 diabetes mellitus with diabetic chronic kidney disease: Secondary | ICD-10-CM | POA: Diagnosis not present

## 2019-09-30 DIAGNOSIS — F0390 Unspecified dementia without behavioral disturbance: Secondary | ICD-10-CM | POA: Diagnosis not present

## 2019-09-30 DIAGNOSIS — Z7901 Long term (current) use of anticoagulants: Secondary | ICD-10-CM | POA: Diagnosis not present

## 2019-09-30 DIAGNOSIS — R109 Unspecified abdominal pain: Secondary | ICD-10-CM | POA: Diagnosis present

## 2019-09-30 DIAGNOSIS — R1084 Generalized abdominal pain: Secondary | ICD-10-CM | POA: Insufficient documentation

## 2019-09-30 LAB — CBC WITH DIFFERENTIAL/PLATELET
Abs Immature Granulocytes: 0.02 10*3/uL (ref 0.00–0.07)
Basophils Absolute: 0 10*3/uL (ref 0.0–0.1)
Basophils Relative: 1 %
Eosinophils Absolute: 0.2 10*3/uL (ref 0.0–0.5)
Eosinophils Relative: 3 %
HCT: 31.3 % — ABNORMAL LOW (ref 36.0–46.0)
Hemoglobin: 10 g/dL — ABNORMAL LOW (ref 12.0–15.0)
Immature Granulocytes: 0 %
Lymphocytes Relative: 15 %
Lymphs Abs: 0.9 10*3/uL (ref 0.7–4.0)
MCH: 29.9 pg (ref 26.0–34.0)
MCHC: 31.9 g/dL (ref 30.0–36.0)
MCV: 93.4 fL (ref 80.0–100.0)
Monocytes Absolute: 0.8 10*3/uL (ref 0.1–1.0)
Monocytes Relative: 13 %
Neutro Abs: 4.3 10*3/uL (ref 1.7–7.7)
Neutrophils Relative %: 68 %
Platelets: 364 10*3/uL (ref 150–400)
RBC: 3.35 MIL/uL — ABNORMAL LOW (ref 3.87–5.11)
RDW: 14.7 % (ref 11.5–15.5)
WBC: 6.3 10*3/uL (ref 4.0–10.5)
nRBC: 0 % (ref 0.0–0.2)

## 2019-09-30 LAB — COMPREHENSIVE METABOLIC PANEL
ALT: 12 U/L (ref 0–44)
AST: 19 U/L (ref 15–41)
Albumin: 3.1 g/dL — ABNORMAL LOW (ref 3.5–5.0)
Alkaline Phosphatase: 47 U/L (ref 38–126)
Anion gap: 9 (ref 5–15)
BUN: 19 mg/dL (ref 8–23)
CO2: 23 mmol/L (ref 22–32)
Calcium: 9.7 mg/dL (ref 8.9–10.3)
Chloride: 107 mmol/L (ref 98–111)
Creatinine, Ser: 1.72 mg/dL — ABNORMAL HIGH (ref 0.44–1.00)
GFR calc Af Amer: 32 mL/min — ABNORMAL LOW (ref >60)
GFR calc non Af Amer: 28 mL/min — ABNORMAL LOW (ref >60)
Glucose, Bld: 122 mg/dL — ABNORMAL HIGH (ref 70–99)
Potassium: 3.6 mmol/L (ref 3.5–5.1)
Sodium: 139 mmol/L (ref 135–145)
Total Bilirubin: 0.8 mg/dL (ref 0.3–1.2)
Total Protein: 6.2 g/dL — ABNORMAL LOW (ref 6.5–8.1)

## 2019-09-30 LAB — URINALYSIS, ROUTINE W REFLEX MICROSCOPIC
Bilirubin Urine: NEGATIVE
Glucose, UA: NEGATIVE mg/dL
Ketones, ur: NEGATIVE mg/dL
Leukocytes,Ua: NEGATIVE
Nitrite: NEGATIVE
Protein, ur: >=300 mg/dL — AB
Specific Gravity, Urine: 1.01 (ref 1.005–1.030)
pH: 6 (ref 5.0–8.0)

## 2019-09-30 LAB — LIPASE, BLOOD: Lipase: 80 U/L — ABNORMAL HIGH (ref 11–51)

## 2019-09-30 IMAGING — CT CT ABD-PELV W/O CM
2 of 7 series · 13 of 46 positions shown, 18 images · non-contrast
Comparison: [DATE]

CLINICAL DATA: Abdominal pain.

EXAM:
CT ABDOMEN AND PELVIS WITHOUT CONTRAST
TECHNIQUE: Multidetector CT imaging of the abdomen and pelvis was performed
following the standard protocol without IV contrast.

[Series 2: axial st · axial · 0.68mm/px · z∈[+1172,+1527]mm · 10 of 83 slices shown, 15 images]
[im 6/83  soft-tissue]
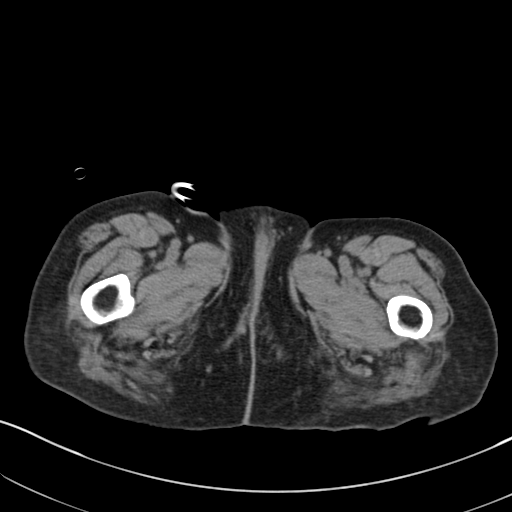
[im 6/83  bone]
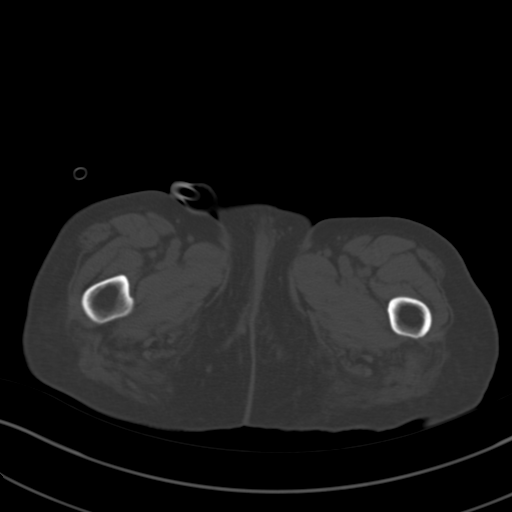
[im 18/83  soft-tissue]
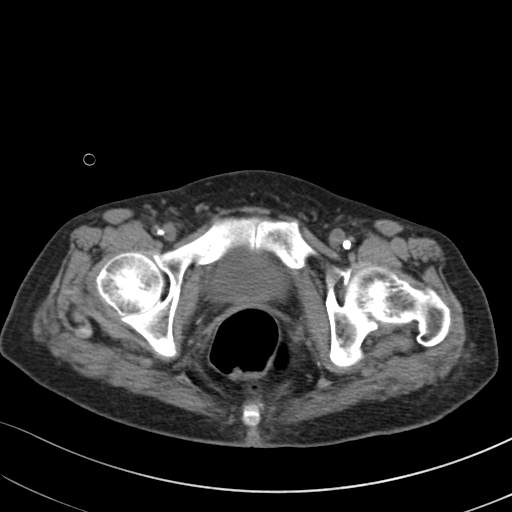
[im 24/83  soft-tissue]
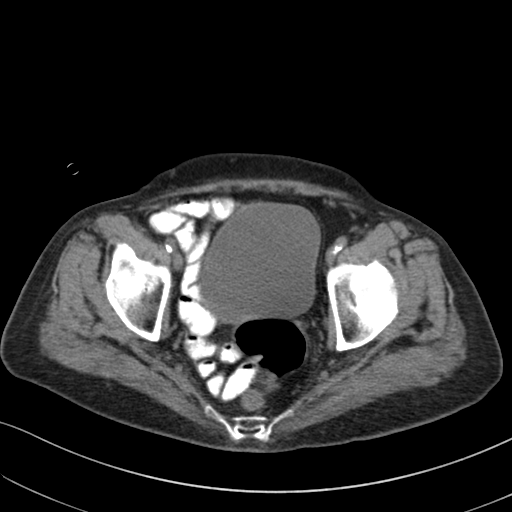
[im 36/83  soft-tissue]
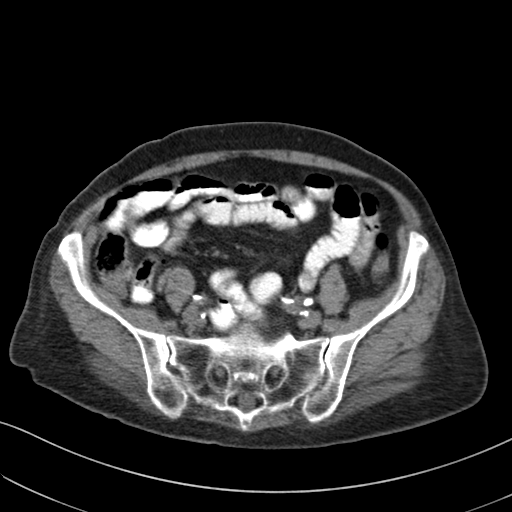
[im 42/83  soft-tissue]
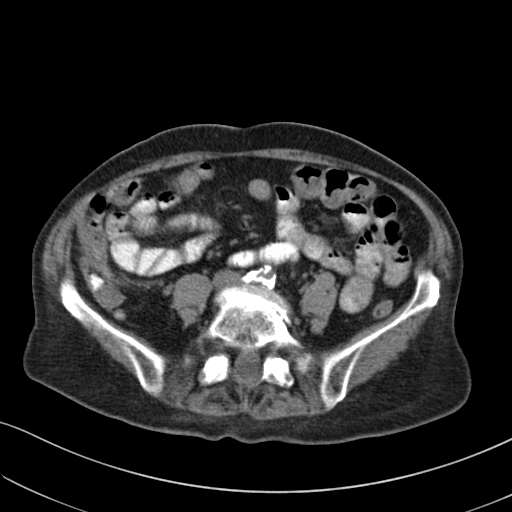
[im 47/83  soft-tissue]
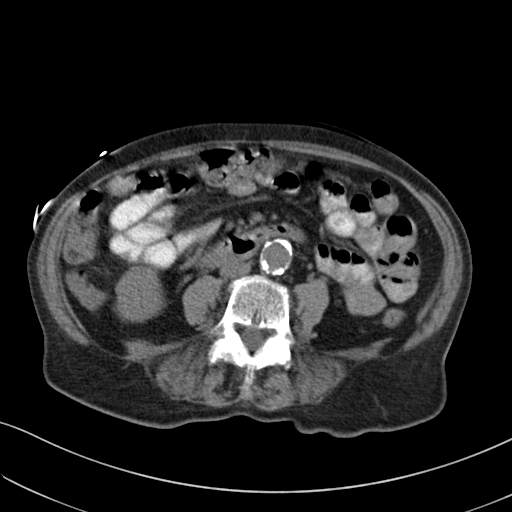
[im 59/83  soft-tissue]
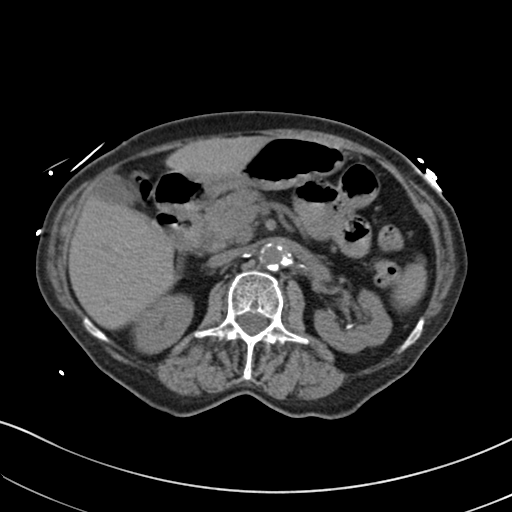
[im 59/83  lung]
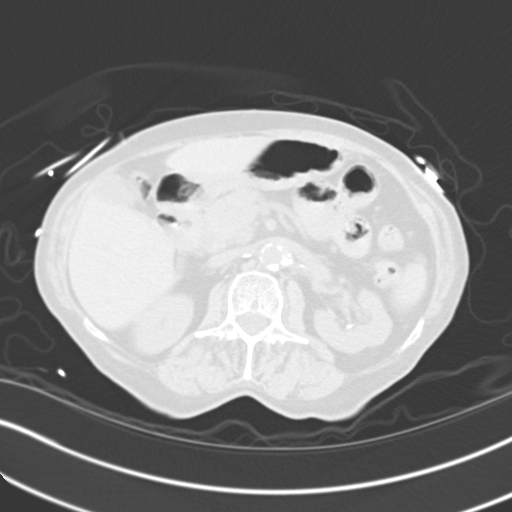
[im 65/83  soft-tissue]
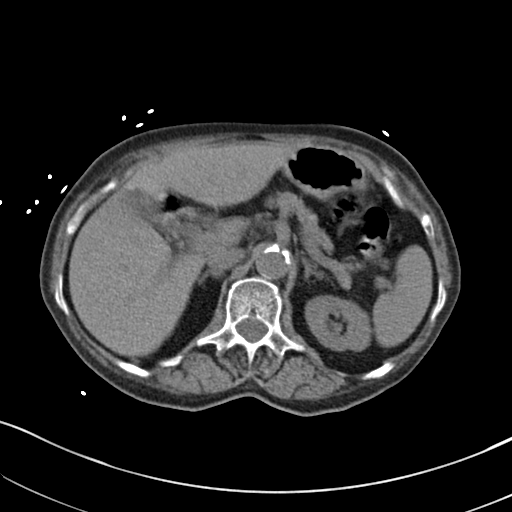
[im 65/83  lung]
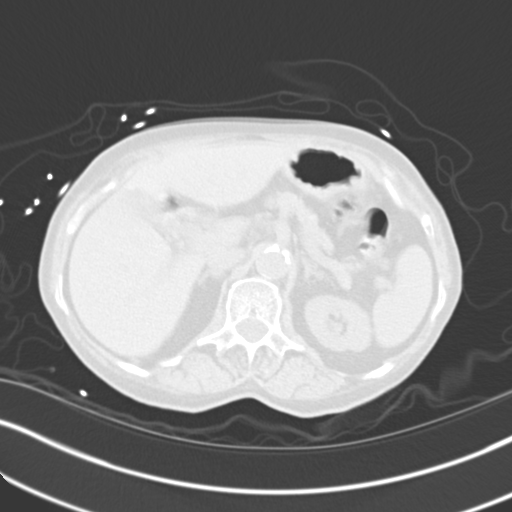
[im 71/83  lung]
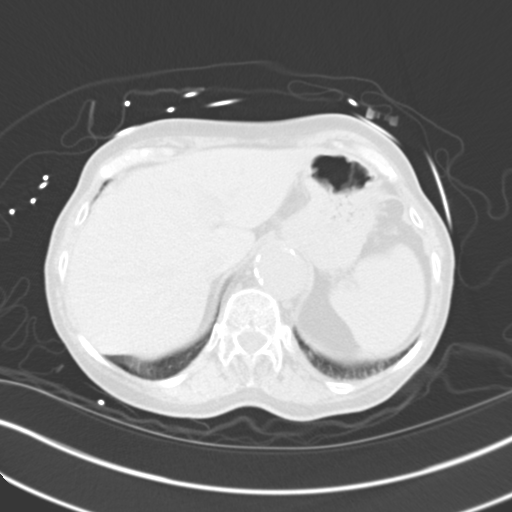
[im 77/83  soft-tissue]
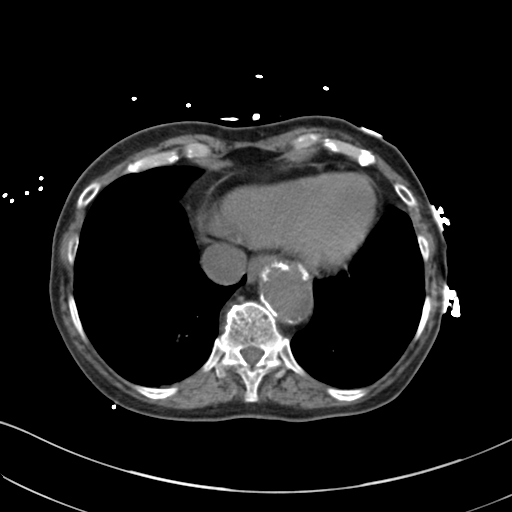
[im 77/83  lung]
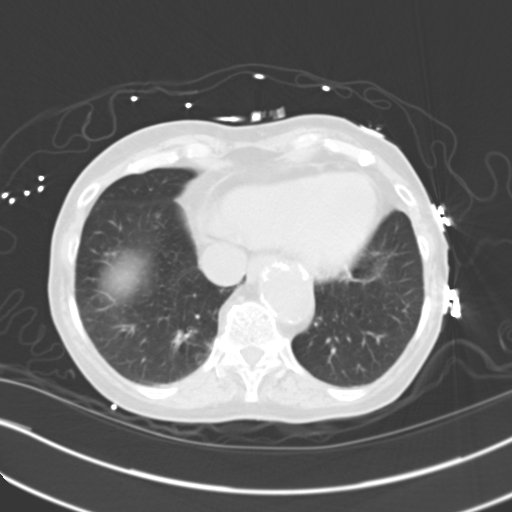
[im 77/83  bone]
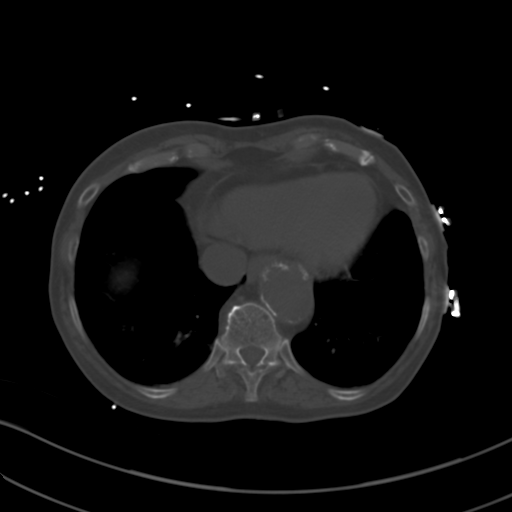

[Series 5: coronal st · coronal · 0.76mm/px · 3 of 112 slices shown]
[im 28/112  soft-tissue]
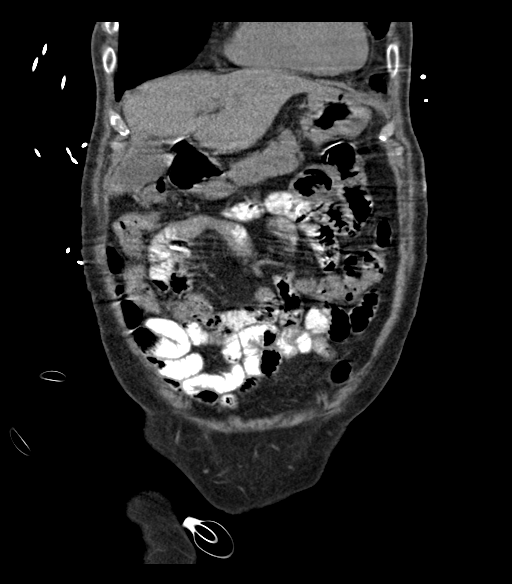
[im 56/112  soft-tissue]
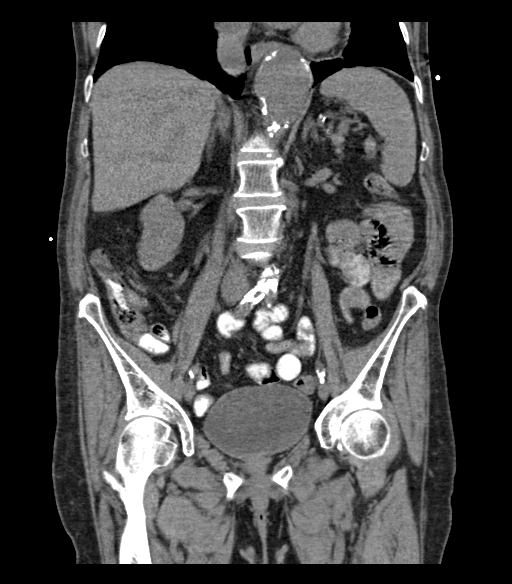
[im 84/112  soft-tissue]
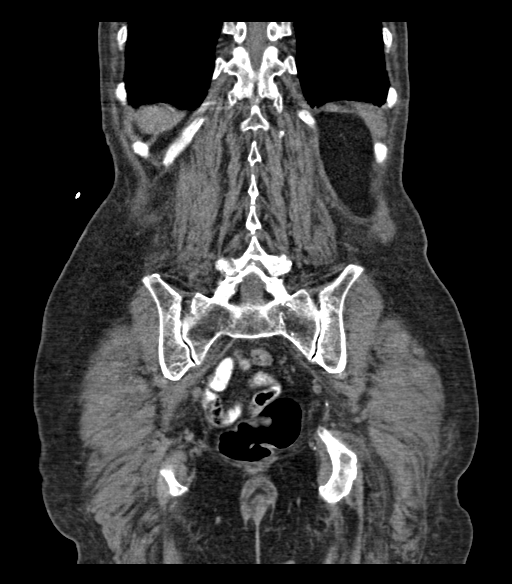

[13 of 46 positions shown; findings below may reference images not displayed]

FINDINGS: Lower chest: Enlarged heart. Small pericardial effusion measuring 5
mm in maximum thickness. Tortuosity and calcific atherosclerotic
disease of the aorta. Aneurysmal dilation of the distal thoracic
aorta with measurements of 4.3 by 3.9 cm, incompletely visualized
due to lack of IV contrast, however appears enlarged when compared
to the recent CT angiogram dated [DATE].

Hepatobiliary: No focal liver abnormality is seen. No gallstones,
gallbladder wall thickening, or biliary dilatation.

Pancreas: Unremarkable. No pancreatic ductal dilatation or
surrounding inflammatory changes.

Spleen: Normal in size without focal abnormality.

Adrenals/Urinary Tract: Adrenal glands are unremarkable. Kidneys are
without renal focal lesion, or hydronephrosis. 6 mm nonobstructive
calculus in the midpole region of the left kidney. Bladder is
unremarkable.

Stomach/Bowel: Stomach is within normal limits. Appendix appears
normal. No evidence of bowel wall thickening, distention, or
inflammatory changes.

Vascular/Lymphatic: Aortic atherosclerosis and tortuosity. No
enlarged abdominal or pelvic lymph nodes.

Reproductive: Status post hysterectomy. No adnexal masses.

Other: No abdominal wall hernia or abnormality. No abdominopelvic
ascites.

Musculoskeletal: Spondylosis of the lumbosacral spine.
IMPRESSION: 1. Aneurysmal dilation of the distal thoracic aorta with maximum
diameter of 4.3 cm, incompletely visualized due to lack of IV
contrast, however appears enlarged when compared to the recent CT
angiogram dated [DATE]. Further evaluation with CT
angiogram of the chest may be considered.
2. 6 mm nonobstructive left renal calculus. No evidence of
obstructive uropathy.
3. Enlarged heart with small pericardial effusion.

Aortic Atherosclerosis ([NT]-[NT]).

## 2019-09-30 IMAGING — DX DG ABDOMEN 1V
1 series · 1 of 1 positions shown · non-contrast
Comparison: CT abdomen and pelvis [DATE].

CLINICAL DATA: Abdominal pain

EXAM:
ABDOMEN - 1 VIEW

[abdomen kub]
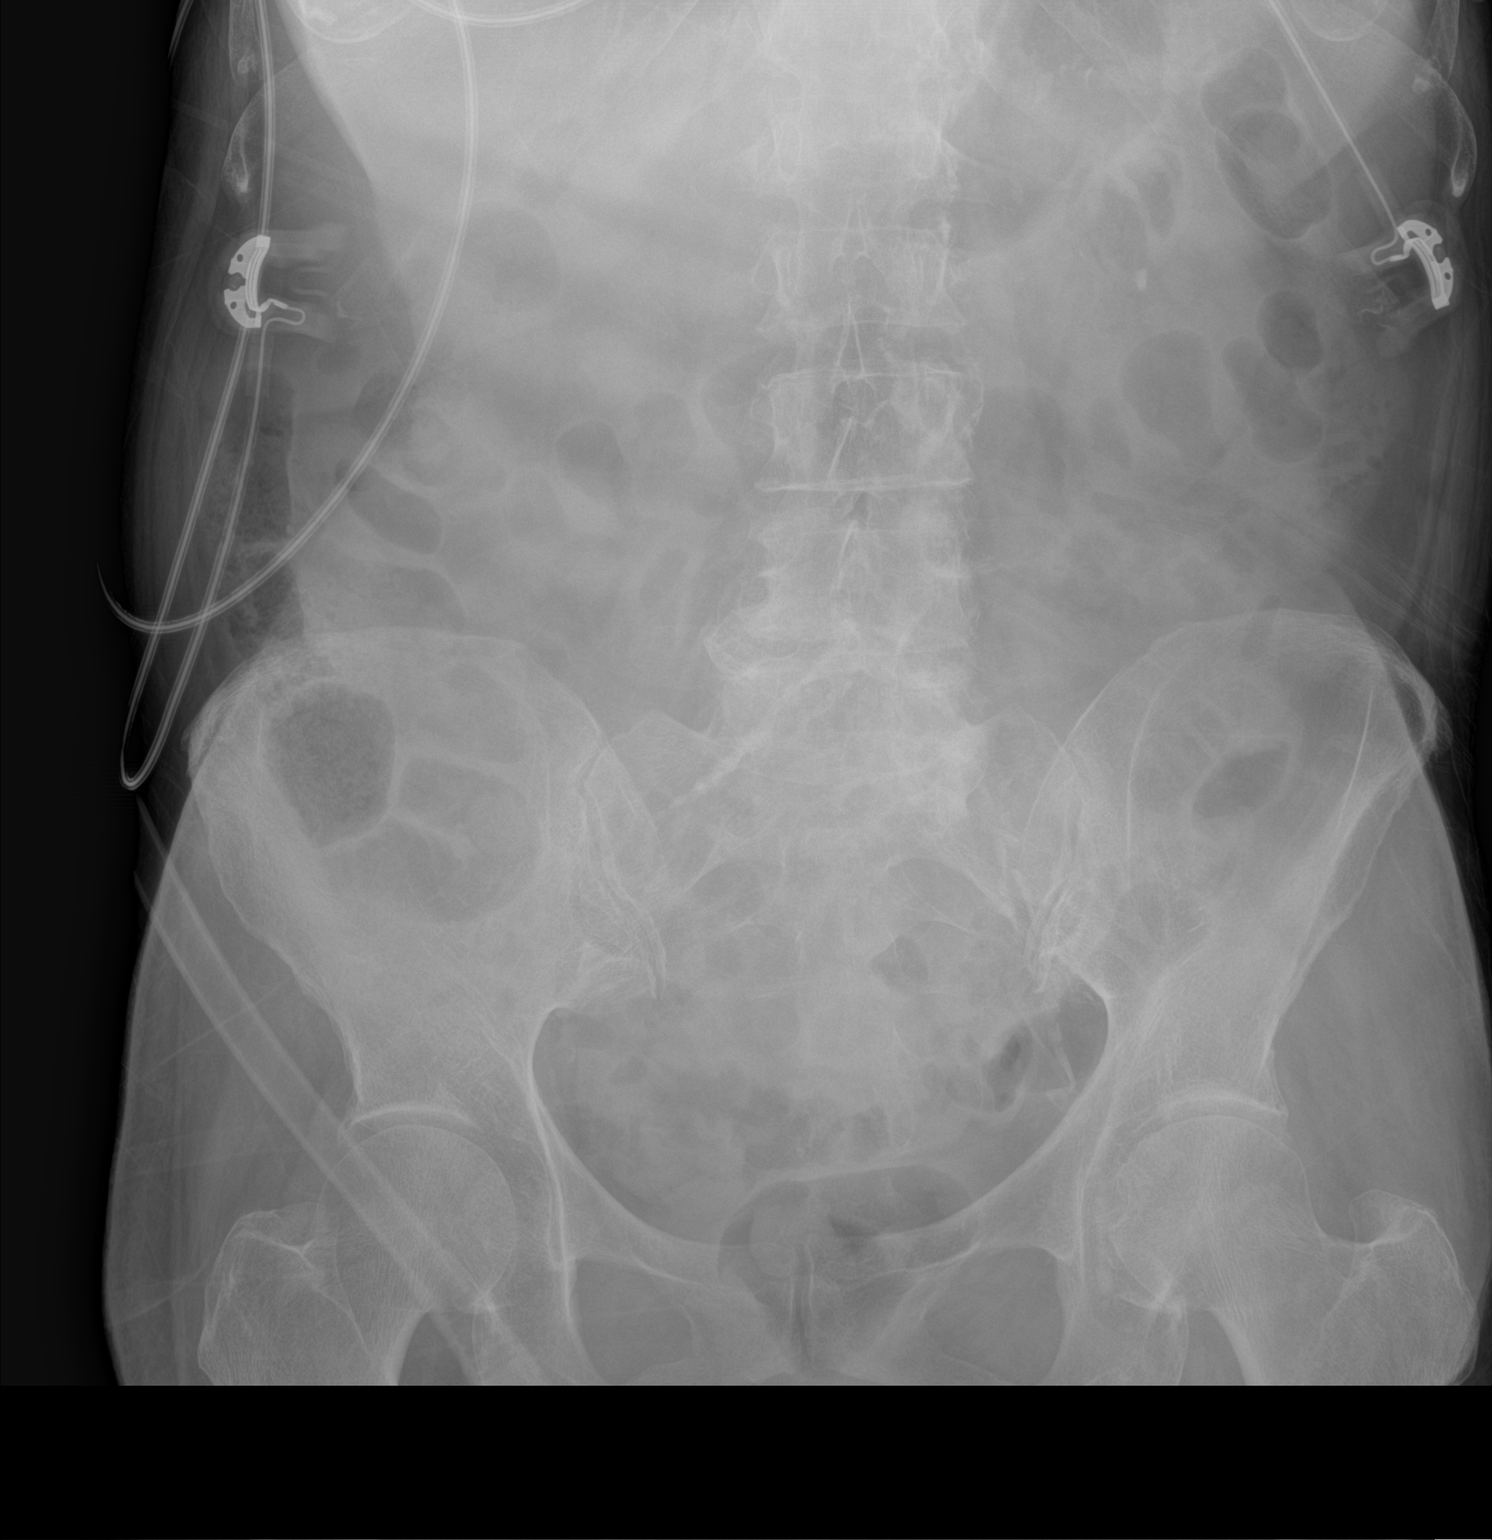

[1 of 1 positions shown; findings below may reference images not displayed]

FINDINGS: There is moderate stool in the colon. There is no bowel dilatation
or air-fluid level to suggest bowel obstruction. No free air. There
is a 4 mm calculus in the left kidney, also seen on prior CT. There
is aortic and iliac artery atherosclerosis.
IMPRESSION: No bowel obstruction or free air. Moderate stool in colon. 4 mm
calculus left kidney.

Aortic Atherosclerosis ([B2]-[B2]).

## 2019-09-30 MED ORDER — IOHEXOL 9 MG/ML PO SOLN
1000.0000 mL | ORAL | Status: AC
  Administered 2019-09-30: 12:00:00 1000 mL via ORAL

## 2019-09-30 MED ORDER — AMLODIPINE BESYLATE 5 MG PO TABS
10.0000 mg | ORAL_TABLET | Freq: Every day | ORAL | Status: DC
  Administered 2019-09-30: 15:00:00 10 mg via ORAL
  Filled 2019-09-30: qty 2

## 2019-09-30 MED ORDER — IOHEXOL 9 MG/ML PO SOLN
ORAL | Status: AC
  Filled 2019-09-30: qty 1000

## 2019-09-30 MED ORDER — LOSARTAN POTASSIUM 50 MG PO TABS
50.0000 mg | ORAL_TABLET | Freq: Once | ORAL | Status: AC
  Administered 2019-09-30: 15:00:00 50 mg via ORAL
  Filled 2019-09-30: qty 1

## 2019-09-30 MED ORDER — ONDANSETRON HCL 4 MG/2ML IJ SOLN
4.0000 mg | Freq: Once | INTRAMUSCULAR | Status: AC
  Administered 2019-09-30: 11:00:00 4 mg via INTRAVENOUS
  Filled 2019-09-30: qty 2

## 2019-09-30 MED ORDER — HYDRALAZINE HCL 10 MG PO TABS
25.0000 mg | ORAL_TABLET | Freq: Two times a day (BID) | ORAL | Status: DC
  Administered 2019-09-30: 15:00:00 25 mg via ORAL
  Filled 2019-09-30: qty 3

## 2019-09-30 MED ORDER — METOPROLOL TARTRATE 25 MG PO TABS
50.0000 mg | ORAL_TABLET | Freq: Every day | ORAL | Status: DC
  Administered 2019-09-30: 50 mg via ORAL
  Filled 2019-09-30: qty 2

## 2019-09-30 MED ORDER — LACTATED RINGERS IV BOLUS
500.0000 mL | Freq: Once | INTRAVENOUS | Status: AC
  Administered 2019-09-30: 11:00:00 500 mL via INTRAVENOUS

## 2019-09-30 NOTE — ED Notes (Signed)
Patient son Chloe Fletcher [phone]) called for an update and states he would like to be updated on plan of care and any  test result. He also stated that his mother was tested this week for multiple myeloma and results are still  pending

## 2019-09-30 NOTE — Progress Notes (Signed)
Called and spoke to Chloe Fletcher about discharge paperwork. Son will return patient to [facility] at discharge

## 2019-09-30 NOTE — ED Provider Notes (Signed)
[facility] DEPT Provider Note   CSN: [id number] Arrival date & time: 09/30/19  [id number]     History Chief Complaint  Patient presents with  . Abdominal Pain    Chloe Fletcher is a 79 y.o. female.  Patient is a 79 year old female with a history of dementia with cognitive decline, diabetes, hypertension, CKD, recent hospitalization for anemia undergoing myeloma work-up and just received 1 unit of blood who is presenting today from her nursing facility with a report of abdominal pain.  Facility reports that she woke up this morning and was complaining of abdominal pain and she was curled into a ball which is not normal for her.  Patient is not able to answer any questions due to her cognitive baseline.  However looking back through past medical records and history she does not typically complain of abdominal pain.  She has no reported prior abdominal surgeries.  There was no report of vomiting or diarrhea at the facility.  The history is provided by the patient and the nursing home. The history is limited by the condition of the patient.  Abdominal Pain Pain location:  Generalized      Past Medical History:  Diagnosis Date  . Back pain   . Carotid artery stenosis   . Cognitive decline   . Diabetes mellitus without complication ([place])   . Hypertension   . Renal disorder     Patient Active Problem List   Diagnosis Date Noted  . Anemia 09/25/2019  . Acute metabolic encephalopathy 99991111  . AKI (acute kidney injury) ([place]) 09/25/2019  . HTN (hypertension) 08/02/2019  . Acute cystitis 08/02/2019  . Hypertensive urgency 08/02/2019  . Diabetes mellitus without complication ([place])   . CKD (chronic kidney disease) stage 3, GFR 30-59 ml/min   . COPD (chronic obstructive pulmonary disease) ([facility])     No past surgical history on file.   OB History   No obstetric history on file.     No family history on file.  Social History   Tobacco Use  . Smoking  status: Current Every Day Smoker  . Smokeless tobacco: Never Used  Substance Use Topics  . Alcohol use: Not Currently  . Drug use: Not Currently    Home Medications Prior to Admission medications   Medication Sig Start Date End Date Taking? Authorizing Provider  acetaminophen (TYLENOL) 500 MG tablet Take 500 mg by mouth every 8 (eight) hours as needed for mild pain or headache.    [provider]  amLODipine (NORVASC) 10 MG tablet Take 1 tablet (10 mg total) by mouth daily. 08/10/19   Antonieta Pert, MD  atorvastatin (LIPITOR) 40 MG tablet Take 1 tablet (40 mg total) by mouth daily at 6 PM. Patient taking differently: Take 40 mg by mouth daily.  08/10/19   Antonieta Pert, MD  cetirizine (ZYRTEC) 10 MG tablet Take 10 mg by mouth daily.     [provider]  Cholecalciferol (VITAMIN D3) 25 MCG (1000 UT) CAPS Take 1,000 Units by mouth daily.     [provider]  clopidogrel (PLAVIX) 75 MG tablet Take 75 mg by mouth daily.    [provider]  cyanocobalamin 1000 MCG tablet Take 1 tablet (1,000 mcg total) by mouth daily. 09/26/19   Regalado, Belkys A, MD  fenofibrate (TRICOR) 48 MG tablet Take 48 mg by mouth daily.    [provider]  ferrous sulfate 325 (65 FE) MG tablet Take 1 tablet (325 mg total) by mouth daily  with breakfast. 09/27/19   Regalado, Belkys A, MD  fluticasone furoate-vilanterol (BREO ELLIPTA) 100-25 MCG/INH AEPB Inhale 1 puff into the lungs daily.    [provider]  Glucosamine-Chondroit-Vit C-Mn (GLUCOSAMINE 1500 COMPLEX PO) Take 1,500 mg by mouth in the morning and at bedtime.    [provider]  hydrALAZINE (APRESOLINE) 25 MG tablet Take 1 tablet (25 mg total) by mouth 2 (two) times daily. 09/26/19   Regalado, Belkys A, MD  losartan (COZAAR) 50 MG tablet Take 1 tablet (50 mg total) by mouth daily. 09/28/19   Harold Hedge, MD  metFORMIN (GLUCOPHAGE) 500 MG tablet Take 500 mg by mouth daily with breakfast.    [provider]  metoprolol tartrate (LOPRESSOR) 50 MG tablet Take 50 mg by mouth daily.     [provider]  Multiple Vitamin (MULTIVITAMIN WITH MINERALS) TABS tablet Take 1 tablet by mouth daily. Patient not taking: Reported on 09/25/2019 08/10/19   Antonieta Pert, MD  Omega-3 Fatty Acids (FISH OIL) 1000 MG CAPS Take 1,000 mg by mouth daily.    [provider]    Allergies    Patient has no known allergies.  Review of Systems   Review of Systems  Unable to perform ROS: Dementia  Gastrointestinal: Positive for abdominal pain.    Physical Exam Updated Vital Signs BP (!) 166/50 (BP Location: Left Arm)   Pulse 76   Temp 98.4 F (36.9 C) (Oral)   Resp (!) 24   SpO2 97%   Physical Exam Vitals and nursing note reviewed.  Constitutional:      General: She is not in acute distress.    Appearance: She is well-developed and normal weight.  HENT:     Head: Normocephalic and atraumatic.  Eyes:     Pupils: Pupils are equal, round, and reactive to light.  Cardiovascular:     Rate and Rhythm: Normal rate and regular rhythm.     Heart sounds: Normal heart sounds. No murmur. No friction rub.  Pulmonary:     Effort: Pulmonary effort is normal.     Breath sounds: Normal breath sounds. No wheezing or rales.  Abdominal:     General: Bowel sounds are normal. There is no distension.     Palpations: Abdomen is soft.     Tenderness: There is abdominal tenderness. There is no guarding or rebound.     Comments: Patient seems to have right sided upper and lower abdominal tenderness as well as epigastric tenderness.  No guarding or rebound noted.  Abdominal bruit present  Musculoskeletal:        General: No tenderness. Normal range of motion.     Cervical back: Normal range of motion and neck supple.     Right lower leg: No edema.     Left lower leg: No edema.     Comments: No edema  Skin:    General: Skin is warm and dry.     Findings: No rash.  Neurological:     Mental Status:  She is alert. Mental status is at baseline.     Cranial Nerves: No cranial nerve deficit.     Comments: Awake and alert.  Oriented to self  Psychiatric:     Comments: Calm and cooperative     ED Results / Procedures / Treatments   Labs (all labs ordered are listed, but only abnormal results are displayed) Labs Reviewed  CBC WITH DIFFERENTIAL/PLATELET - Abnormal; Notable for the following components:  Result Value   RBC 3.35 (*)    Hemoglobin 10.0 (*)    HCT 31.3 (*)    All other components within normal limits  COMPREHENSIVE METABOLIC PANEL - Abnormal; Notable for the following components:   Glucose, Bld 122 (*)    Creatinine, Ser 1.72 (*)    Total Protein 6.2 (*)    Albumin 3.1 (*)    GFR calc non Af Amer 28 (*)    GFR calc Af Amer 32 (*)    All other components within normal limits  LIPASE, BLOOD - Abnormal; Notable for the following components:   Lipase 80 (*)    All other components within normal limits  URINALYSIS, ROUTINE W REFLEX MICROSCOPIC - Abnormal; Notable for the following components:   Hgb urine dipstick SMALL (*)    Protein, ur >=300 (*)    Bacteria, UA RARE (*)    All other components within normal limits    EKG EKG Interpretation  Date/Time:  Saturday September 30 2019 09:44:51 EDT Ventricular Rate:  79 PR Interval:    QRS Duration: 121 QT Interval:  408 QTC Calculation: 468 R Axis:   79 Text Interpretation: Sinus rhythm Nonspecific intraventricular conduction delay ST flattened diffusely compared to prior tracing Confirmed by Blanchie Dessert [phone]) on 09/30/2019 9:58:30 AM   Radiology CT ABDOMEN PELVIS WO CONTRAST  Result Date: 09/30/2019 CLINICAL DATA:  Abdominal pain. EXAM: CT ABDOMEN AND PELVIS WITHOUT CONTRAST TECHNIQUE: Multidetector CT imaging of the abdomen and pelvis was performed following the standard protocol without IV contrast. COMPARISON:  July 31, 2018 FINDINGS: Lower chest: Enlarged heart. Small pericardial effusion measuring 5  mm in maximum thickness. Tortuosity and calcific atherosclerotic disease of the aorta. 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 Hepatobiliary: No focal liver abnormality is seen. No gallstones, gallbladder wall thickening, or biliary dilatation. Pancreas: Unremarkable. No pancreatic ductal dilatation or surrounding inflammatory changes. Spleen: Normal in size without focal abnormality. Adrenals/Urinary Tract: Adrenal glands are unremarkable. Kidneys are without renal focal lesion, or hydronephrosis. 6 mm nonobstructive calculus in the midpole region of the left kidney. Bladder is unremarkable. Stomach/Bowel: Stomach is within normal limits. Appendix appears normal. No evidence of bowel wall thickening, distention, or inflammatory changes. Vascular/Lymphatic: Aortic atherosclerosis and tortuosity. No enlarged abdominal or pelvic lymph nodes. Reproductive: Status post hysterectomy. No adnexal masses. Other: No abdominal wall hernia or abnormality. No abdominopelvic ascites. Musculoskeletal: Spondylosis of the lumbosacral spine. IMPRESSION: 1. 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 Further evaluation with CT angiogram of the chest may be considered. 2. 6 mm nonobstructive left renal calculus. No evidence of obstructive uropathy. 3. Enlarged heart with small pericardial effusion. Aortic Atherosclerosis (ICD10-I70.0). Electronically Signed   By: Fidela Salisbury M.D.   On: 09/30/2019 13:11   DG Abdomen 1 View  Result Date: 09/30/2019 CLINICAL DATA:  Abdominal pain EXAM: ABDOMEN - 1 VIEW COMPARISON:  CT abdomen and pelvis August 01, 2019. FINDINGS:  There is moderate stool in the colon. There is no bowel dilatation or air-fluid level to suggest bowel obstruction. No free air. There is a 4 mm calculus in the left kidney, also seen on prior CT. There is aortic and  iliac artery atherosclerosis. IMPRESSION: No bowel obstruction or free air. Moderate stool in colon. 4 mm calculus left kidney. Aortic Atherosclerosis (ICD10-I70.0). Electronically Signed   By: Lowella Grip III M.D.   On: 09/30/2019 10:45    Procedures Procedures (including critical care time)  Medications Ordered in ED Medications  ondansetron (ZOFRAN) injection 4 mg (has no administration in time range)    ED Course  I have reviewed the triage vital signs and the nursing notes.  Pertinent labs & imaging results that were available during my care of the patient were reviewed by me and considered in my medical decision making (see chart for details).    MDM Rules/Calculators/A&P                      Patient presenting today with complaints of abdominal pain.  Patient is not able to give any history however on exam she does appear tender in the epigastric and right side of her abdomen.  An abdominal bruit is heard in the epigastric area.  However bowel sounds are present.  Low suspicion for obstruction as patient has had no vomiting and no report of change in bowel movements.  Patient does not typically complain of abdominal pain.  She does have a history of recent anemia receiving 1 unit of blood and is currently undergoing myeloma work-up.  Vital signs are stable today.  However given patient is a poor historian and new abdominal pain will further evaluate with a CBC, CMP, lipase, UA, EKG and abdominal CT to further evaluate.  Lower suspicion for ischemic bowel at this time however concern for volvulus versus appendicitis versus cholecystitis versus gastritis versus pancreatitis.  Lower suspicion for diverticulitis at this time.  Also possible UTI although this is lower  suspicion.  1:48 PM  I ordered and personally reviewed all labs and CT images and patient found to have a CBC with normal white count and stable hemoglobin of 10 with normal platelet count, CMP with mild AKI today with creatinine 1.72 from her baseline of 1.4.  UA without evidence of infection today but greater than 300 protein in the urine.  Lipase is mildly elevated at 80.  Initial KUB showed no sign of bowel obstruction or free air but moderate stool in the colon and a 4 mm calculus in the left kidney.  Given patient's elevated lipase and poor historian CT was done that showed aneurysmal dilation of the distal thoracic aorta with maximum that amateur of 4.3 from 3.9 from her CTA in February of this year.  However this is lack of IV contrast and a CT angio could be considered however do not feel that this is the source of her symptoms today and did discuss these findings with her son and reported that they can get further imaging on an outpatient basis if necessary.  Patient had an echo done within the last month that showed no acute issue.  Patient did have a 6 mm nonobstructive left renal calculus however do not feel that that was the source of her symptoms either.  Patient on reevaluation has no abdominal pain and feel that she is safe for discharge.  All findings were discussed with her son and they will follow up closely with her PCP.  Final Clinical Impression(s) / ED Diagnoses Final diagnoses:  Generalized abdominal pain    Rx / DC Orders ED Discharge Orders    None       Blanchie Dessert, MD 09/30/19 1355

## 2019-09-30 NOTE — Discharge Instructions (Signed)
Lab work was relatively normal today except for some mild dehydration.  No evidence of urinary tract infection.  CAT scan did not show any acute bowel issue.  She can have outpatient follow-up for the possible enlarged thoracic aorta.  It would need CT with IV contrast as an outpatient but does not appear the cause of her symptoms today.  She is not having any abdominal pain or vomiting at this time.  She can resume her normal diet.

## 2019-09-30 NOTE — ED Notes (Signed)
Purewick applied.

## 2019-09-30 NOTE — ED Notes (Signed)
In & Out catheter performed w/ drain, urine specimen collected, perineal care provided, PT repositioned.

## 2019-09-30 NOTE — ED Notes (Signed)
PT denied being able to void at this time, will monitor.

## 2019-09-30 NOTE — ED Triage Notes (Signed)
Patient brought in by EMS who was called to facility because patient was complaining of abdominal pain

## 2019-10-11 ENCOUNTER — Inpatient Hospital Stay: Payer: Medicare Other | Attending: Nurse Practitioner | Admitting: Nurse Practitioner

## 2019-10-11 ENCOUNTER — Telehealth: Payer: Self-pay

## 2019-10-11 ENCOUNTER — Inpatient Hospital Stay: Payer: Medicare Other

## 2019-10-11 DIAGNOSIS — Z8673 Personal history of transient ischemic attack (TIA), and cerebral infarction without residual deficits: Secondary | ICD-10-CM | POA: Insufficient documentation

## 2019-10-11 DIAGNOSIS — Z79899 Other long term (current) drug therapy: Secondary | ICD-10-CM | POA: Insufficient documentation

## 2019-10-11 DIAGNOSIS — F0390 Unspecified dementia without behavioral disturbance: Secondary | ICD-10-CM | POA: Insufficient documentation

## 2019-10-11 DIAGNOSIS — N19 Unspecified kidney failure: Secondary | ICD-10-CM | POA: Insufficient documentation

## 2019-10-11 DIAGNOSIS — D649 Anemia, unspecified: Secondary | ICD-10-CM | POA: Insufficient documentation

## 2019-10-11 DIAGNOSIS — I10 Essential (primary) hypertension: Secondary | ICD-10-CM | POA: Insufficient documentation

## 2019-10-11 DIAGNOSIS — K922 Gastrointestinal hemorrhage, unspecified: Secondary | ICD-10-CM | POA: Insufficient documentation

## 2019-10-11 DIAGNOSIS — J449 Chronic obstructive pulmonary disease, unspecified: Secondary | ICD-10-CM | POA: Insufficient documentation

## 2019-10-11 DIAGNOSIS — R195 Other fecal abnormalities: Secondary | ICD-10-CM | POA: Insufficient documentation

## 2019-10-11 NOTE — Telephone Encounter (Signed)
Return TC to Pt's son, Pt's son stated that he needed to get information on his mother's blood results. Informed son that Pt had an appointment today that was missed. Pt.'s son stated that he was not aware of this appointment. Pt's son transferred to Ned Card NP. Appointment rescheduled for tomorrow 10/12/19 with Dr. Benay Spice and lab. Pt.'s son verbalized understanding.

## 2019-10-12 ENCOUNTER — Other Ambulatory Visit: Payer: Self-pay | Admitting: *Deleted

## 2019-10-12 ENCOUNTER — Telehealth: Payer: Self-pay | Admitting: *Deleted

## 2019-10-12 ENCOUNTER — Inpatient Hospital Stay: Payer: Medicare Other

## 2019-10-12 ENCOUNTER — Inpatient Hospital Stay (HOSPITAL_BASED_OUTPATIENT_CLINIC_OR_DEPARTMENT_OTHER): Payer: Medicare Other | Admitting: Oncology

## 2019-10-12 ENCOUNTER — Other Ambulatory Visit: Payer: Self-pay

## 2019-10-12 VITALS — BP 140/57 | HR 85 | Temp 98.9°F | Resp 18 | Ht 62.0 in | Wt 106.9 lb

## 2019-10-12 DIAGNOSIS — D649 Anemia, unspecified: Secondary | ICD-10-CM

## 2019-10-12 DIAGNOSIS — K922 Gastrointestinal hemorrhage, unspecified: Secondary | ICD-10-CM | POA: Diagnosis not present

## 2019-10-12 DIAGNOSIS — F0390 Unspecified dementia without behavioral disturbance: Secondary | ICD-10-CM | POA: Diagnosis not present

## 2019-10-12 DIAGNOSIS — J449 Chronic obstructive pulmonary disease, unspecified: Secondary | ICD-10-CM | POA: Diagnosis not present

## 2019-10-12 DIAGNOSIS — R195 Other fecal abnormalities: Secondary | ICD-10-CM | POA: Diagnosis not present

## 2019-10-12 DIAGNOSIS — N19 Unspecified kidney failure: Secondary | ICD-10-CM | POA: Diagnosis not present

## 2019-10-12 DIAGNOSIS — Z79899 Other long term (current) drug therapy: Secondary | ICD-10-CM | POA: Diagnosis not present

## 2019-10-12 DIAGNOSIS — Z8673 Personal history of transient ischemic attack (TIA), and cerebral infarction without residual deficits: Secondary | ICD-10-CM | POA: Diagnosis not present

## 2019-10-12 DIAGNOSIS — I10 Essential (primary) hypertension: Secondary | ICD-10-CM | POA: Diagnosis not present

## 2019-10-12 LAB — CBC WITH DIFFERENTIAL (CANCER CENTER ONLY)
Abs Immature Granulocytes: 0.02 10*3/uL (ref 0.00–0.07)
Basophils Absolute: 0 10*3/uL (ref 0.0–0.1)
Basophils Relative: 1 %
Eosinophils Absolute: 0.1 10*3/uL (ref 0.0–0.5)
Eosinophils Relative: 2 %
HCT: 21 % — ABNORMAL LOW (ref 36.0–46.0)
Hemoglobin: 6.7 g/dL — CL (ref 12.0–15.0)
Immature Granulocytes: 0 %
Lymphocytes Relative: 10 %
Lymphs Abs: 0.7 10*3/uL (ref 0.7–4.0)
MCH: 30.2 pg (ref 26.0–34.0)
MCHC: 31.9 g/dL (ref 30.0–36.0)
MCV: 94.6 fL (ref 80.0–100.0)
Monocytes Absolute: 0.8 10*3/uL (ref 0.1–1.0)
Monocytes Relative: 12 %
Neutro Abs: 5.2 10*3/uL (ref 1.7–7.7)
Neutrophils Relative %: 75 %
Platelet Count: 328 10*3/uL (ref 150–400)
RBC: 2.22 MIL/uL — ABNORMAL LOW (ref 3.87–5.11)
RDW: 15.2 % (ref 11.5–15.5)
WBC Count: 6.9 10*3/uL (ref 4.0–10.5)
nRBC: 0 % (ref 0.0–0.2)

## 2019-10-12 LAB — PREPARE RBC (CROSSMATCH)

## 2019-10-12 LAB — SAMPLE TO BLOOD BANK

## 2019-10-12 LAB — ABO/RH: ABO/RH(D): A NEG

## 2019-10-12 MED ORDER — SODIUM CHLORIDE 0.9% IV SOLUTION
250.0000 mL | Freq: Once | INTRAVENOUS | Status: AC
  Administered 2019-10-12: 250 mL via INTRAVENOUS
  Filled 2019-10-12: qty 250

## 2019-10-12 NOTE — Progress Notes (Signed)
  [place] [facility] OFFICE PROGRESS NOTE   Diagnosis: Anemia  INTERVAL HISTORY:   I saw Chloe Fletcher when she was hospitalized last month with severe anemia.  There was no clear explanation for the anemia, though I felt the anemia could be in part related to renal failure, myelodysplasia, or malnutrition.  She was discharged in the hospital 09/28/2019.  She returned to the emergency room on 09/30/2019 with abdominal pain.  A CT of the abdomen revealed no splenomegaly or adenopathy.  There is aneurysmal dilatation of the distal thoracic aorta and the heart is enlarged with a small pericardial effusion. She was transfused with packed red blood cells in the hospital and the hemoglobin returned at 10 on 09/30/2019.  She is here today with her son.  He reports the dementia has progressed.  He says she reported to him today that she has "coffee ground" stool.  She says that she is always short of breath.  No other complaint. Objective:  Vital signs in last 24 hours:  Blood pressure (!) 140/57, pulse 85, temperature 98.9 F (37.2 C), temperature source Temporal, resp. rate 18, height 5\' 2"  (1.575 m), weight 106 lb 14.4 oz (48.5 kg), SpO2 100 %.    Resp: Decreased breath sounds with rhonchi at the posterior bases bilaterally, no respiratory distress Cardio: Regular rate and rhythm GI: No hepatosplenomegaly, nontender Vascular: No leg edema Rectal: Soft external hemorrhoids, no rectal mass, black stool-Hemoccult positive    Lab Results:  Lab Results  Component Value Date   WBC 6.9 10/12/2019   HGB 6.7 (LL) 10/12/2019   HCT 21.0 (L) 10/12/2019   MCV 94.6 10/12/2019   PLT 328 10/12/2019   NEUTROABS 5.2 10/12/2019    CMP  Lab Results  Component Value Date   NA 139 09/30/2019   K 3.6 09/30/2019   CL 107 09/30/2019   CO2 23 09/30/2019   GLUCOSE 122 (H) 09/30/2019   BUN 19 09/30/2019   CREATININE 1.72 (H) 09/30/2019   CALCIUM 9.7 09/30/2019   PROT 6.2 (L) 09/30/2019   ALBUMIN 3.1  (L) 09/30/2019   AST 19 09/30/2019   ALT 12 09/30/2019   ALKPHOS 47 09/30/2019   BILITOT 0.8 09/30/2019   GFRNONAA 28 (L) 09/30/2019   GFRAA 32 (L) 09/30/2019    Medications: I have reviewed the patient's current medications.   Assessment/Plan: 1.Normocytic anemia 2.Elevated indirect bilirubin 3.Dementia 4.COPD 5.Hypertension 6.CVA February 2021 7.Renal failure 8.  Black Hemoccult positive stool on exam 10/12/2019  Disposition: [name] has severe anemia.  The hemoglobin has dropped since she was discharged from the hospital 2 weeks ago.  She has clinical evidence of GI bleeding.  This likely explains the anemia.  I discussed the differential diagnosis with her son.  There may be a component of anemia secondary to renal failure and malnutrition.  I have a low clinical suspicion for a hematopoietic malignancy.  He would like to proceed with further evaluation of the anemia.  She will be transfused with 2 units of packed red cells and return for a CBC next week.  We will schedule an office visit for 2 weeks.  I will contact gastroenterology to request an expedited GI evaluation, likely to include an upper endoscopy.  Chloe Coder, MD  10/12/2019  11:39 AM

## 2019-10-12 NOTE — Telephone Encounter (Signed)
Pt Hgb 6.7. Provider Dr.Sherrill made aware

## 2019-10-12 NOTE — Progress Notes (Signed)
Blood orders confirmed w/blood bank. Reviewed informed consent w/patient and son and son signed consent.

## 2019-10-13 ENCOUNTER — Telehealth: Payer: Self-pay

## 2019-10-13 ENCOUNTER — Inpatient Hospital Stay: Payer: Medicare Other

## 2019-10-13 ENCOUNTER — Other Ambulatory Visit: Payer: Self-pay

## 2019-10-13 ENCOUNTER — Other Ambulatory Visit: Payer: Self-pay | Admitting: *Deleted

## 2019-10-13 DIAGNOSIS — D649 Anemia, unspecified: Secondary | ICD-10-CM

## 2019-10-13 LAB — BPAM RBC
Blood Product Expiration Date: 202104242359
Blood Product Expiration Date: 202104252359
ISSUE DATE / TIME: 202104151428
ISSUE DATE / TIME: 202104151428
Unit Type and Rh: 600
Unit Type and Rh: 600

## 2019-10-13 LAB — TYPE AND SCREEN
ABO/RH(D): A NEG
Antibody Screen: NEGATIVE
Unit division: 0
Unit division: 0

## 2019-10-13 LAB — PREPARE RBC (CROSSMATCH)

## 2019-10-13 MED ORDER — SODIUM CHLORIDE 0.9% IV SOLUTION
250.0000 mL | Freq: Once | INTRAVENOUS | Status: AC
  Administered 2019-10-13: 15:00:00 250 mL via INTRAVENOUS
  Filled 2019-10-13: qty 250

## 2019-10-13 MED ORDER — SODIUM CHLORIDE 0.9% IV SOLUTION
250.0000 mL | Freq: Once | INTRAVENOUS | Status: AC
  Filled 2019-10-13: qty 250

## 2019-10-13 NOTE — Telephone Encounter (Signed)
10/17/19 appt made with Alonza Bogus. Pt aware

## 2019-10-13 NOTE — Telephone Encounter (Signed)
Spoke with scheduling at [facility] per Dr. Havery Moros patient needs to be seen by PA today or Monday, no availability, can be seen on Tuesday 4/20 at 1:30, they will call her son if there is an earlier cancellation. Her son was notified of the appointment.

## 2019-10-13 NOTE — Patient Instructions (Addendum)
Blood Transfusion, Adult A blood transfusion is a procedure in which you receive blood through an IV tube. You may need this procedure because of:  A bleeding disorder.  An illness.  An injury.  A surgery. The blood may come from someone else (a donor). You may also be able to donate blood for yourself. The blood given in a transfusion is made up of different types of cells. You may get:  Red blood cells. These carry oxygen to the cells in the body.  White blood cells. These help you fight infections.  Platelets. These help your blood to clot.  Plasma. This is the liquid part of your blood. It carries proteins and other substances through the body. If you have a clotting disorder, you may also get other types of blood products. Tell your doctor about:  Any blood disorders you have.  Any reactions you have had during a blood transfusion in the past.  Any allergies you have.  All medicines you are taking, including vitamins, herbs, eye drops, creams, and over-the-counter medicines.  Any surgeries you have had.  Any medical conditions you have. This includes any recent fever or cold symptoms.  Whether you are pregnant or may be pregnant. What are the risks? Generally, this is a safe procedure. However, problems may occur.  The most common problems include: ? A mild allergic reaction. This includes red, swollen areas of skin (hives) and itching. ? Fever or chills. This may be the body's response to new blood cells received. This may happen during or up to 4 hours after the transfusion.  More serious problems may include: ? Too much fluid in the lungs. This may cause breathing problems. ? A serious allergic reaction. This includes breathing trouble or swelling around the face and lips. ? Lung injury. This causes breathing trouble and low oxygen in the blood. This can happen within hours of the transfusion or days later. ? Too much iron. This can happen after getting many  blood transfusions over a period of time. ? An infection or virus passed through the blood. This is rare. Donated blood is carefully tested before it is given. ? Your body's defense system (immune system) trying to attack the new blood cells. This is rare. Symptoms may include fever, chills, nausea, low blood pressure, and low back or chest pain. ? Donated cells attacking healthy tissues. This is rare. What happens before the procedure? Medicines Ask your doctor about:  Changing or stopping your normal medicines. This is important.  Taking aspirin and ibuprofen. Do not take these medicines unless your doctor tells you to take them.  Taking over-the-counter medicines, vitamins, herbs, and supplements. General instructions  Follow instructions from your doctor about what you cannot eat or drink.  You will have a blood test to find out your blood type. The test also finds out what type of blood your body will accept and matches it to the donor type.  If you are going to have a planned surgery, you may be able to donate your own blood. This may be done in case you need a transfusion.  You will have your temperature, blood pressure, and pulse checked.  You may receive medicine to help prevent an allergic reaction. This may be done if you have had a reaction to a transfusion before. This medicine may be given to you by mouth or through an IV tube.  This procedure lasts about 1-4 hours. Plan for the time you need. What happens during the   procedure?   An IV tube will be put into one of your veins.  The bag of donated blood will be attached to your IV tube. Then, the blood will enter through your vein.  Your temperature, blood pressure, and pulse will be checked often. This is done to find early signs of a transfusion reaction.  Tell your nurse right away if you have any of these symptoms: ? Shortness of breath or trouble breathing. ? Chest or back pain. ? Fever or chills. ? Red,  swollen areas of skin or itching.  If you have any signs or symptoms of a reaction, your transfusion will be stopped. You may also be given medicine.  When the transfusion is finished, your IV tube will be taken out.  Pressure may be put on the IV site for a few minutes.  A bandage (dressing) will be put on the IV site. The procedure may vary among doctors and hospitals. What happens after the procedure?  You will be monitored until you leave the hospital or clinic. This includes checking your temperature, blood pressure, pulse, breathing rate, and blood oxygen level.  Your blood may be tested to see how you are responding to the transfusion.  You may be warmed with fluids or blankets. This is done to keep the temperature of your body normal.  If you have your procedure in an outpatient setting, you will be told whom to contact to report any reactions. Where to find more information To learn more, visit the [employer]: redcross.org Summary  A blood transfusion is a procedure in which you are given blood through an IV tube.  The blood may come from someone else (a donor). You may also be able to donate blood for yourself.  The blood you are given is made up of different blood cells. You may receive red blood cells, platelets, plasma, or white blood cells.  Your temperature, blood pressure, and pulse will be checked often.  After the procedure, your blood may be tested to see how you are responding. This information is not intended to replace advice given to you by your health care provider. Make sure you discuss any questions you have with your health care provider. Document Revised: 12/08/2018 Document Reviewed: 12/08/2018 Elsevier Patient Education  2020 [employer] Inc.  Coronavirus (COVID-19) Are you at risk?  Are you at risk for the Coronavirus (COVID-19)?  To be considered HIGH RISK for Coronavirus (COVID-19), you have to meet the following criteria:  . Traveled  to [place], [place], [place], [place] or [place]; or in the [place] to [place], [place], [place], or [place]; and have fever, cough, and shortness of breath within the last 2 weeks of travel OR . Been in close contact with a person diagnosed with COVID-19 within the last 2 weeks and have fever, cough, and shortness of breath . IF YOU DO NOT MEET THESE CRITERIA, YOU ARE CONSIDERED LOW RISK FOR COVID-19.  What to do if you are HIGH RISK for COVID-19?  . If you are having a medical emergency, call 911. . Seek medical care right away. Before you go to a doctor's office, urgent care or emergency department, call ahead and tell them about your recent travel, contact with someone diagnosed with COVID-19, and your symptoms. You should receive instructions from your physician's office regarding next steps of care.  . When you arrive at healthcare provider, tell the healthcare staff immediately you have returned from visiting [place], [place], [place], [place]   or [place]; or traveled in the [place] to [place], [place], [place], or [place]; in the last two weeks or you have been in close contact with a person diagnosed with COVID-19 in the last 2 weeks.   . Tell the health care staff about your symptoms: fever, cough and shortness of breath. . After you have been seen by a medical provider, you will be either: o Tested for (COVID-19) and discharged home on quarantine except to seek medical care if symptoms worsen, and asked to  - Stay home and avoid contact with others until you get your results (4-5 days)  - Avoid travel on public transportation if possible (such as bus, train, or airplane) or o Sent to the Emergency Department by EMS for evaluation, COVID-19 testing, and possible admission depending on your condition and test results.  What to do if you are LOW RISK for COVID-19?  Reduce your risk of any infection by using the same precautions used for avoiding the common cold or  flu:  . Wash your hands often with soap and warm water for at least 20 seconds.  If soap and water are not readily available, use an alcohol-based hand sanitizer with at least 60% alcohol.  . If coughing or sneezing, cover your mouth and nose by coughing or sneezing into the elbow areas of your shirt or coat, into a tissue or into your sleeve (not your hands). . Avoid shaking hands with others and consider head nods or verbal greetings only. . Avoid touching your eyes, nose, or mouth with unwashed hands.  . Avoid close contact with people who are sick. . Avoid places or events with large numbers of people in one location, like concerts or sporting events. . Carefully consider travel plans you have or are making. . If you are planning any travel outside or inside the [place], visit the CDC's Travelers' Health webpage for the latest health notices. . If you have some symptoms but not all symptoms, continue to monitor at home and seek medical attention if your symptoms worsen. . If you are having a medical emergency, call 911.   ADDITIONAL HEALTHCARE OPTIONS FOR PATIENTS   Telehealth / e-Visit: [email or web address]         [facility]: [phone]  [facility]: [phone]                   [facility]: [phone]  

## 2019-10-13 NOTE — Progress Notes (Signed)
Not able to receive both units of blood on 10/12/19. 2nd unit today at 1400. Order placed for 1 unit.

## 2019-10-13 NOTE — Telephone Encounter (Signed)
-----   Message from Milus Banister, MD sent at 10/13/2019  5:26 AM EDT ----- [name], thanks. We'll get her in quickly.  Domingos Riggi, She needs first available OV with any provider (MD or extender).  If no spots before Wednesday of next week then double book with me early next week.  Contact through her son.  Remind him that she needs to stay OFF plavix.  Thanks  ----- Message ----- From: Ladell Pier, MD Sent: 10/12/2019   5:00 PM EDT To: Milus Banister, MD  This is the patient we spoke about today, severe anemia, transfused with 2 units red cells today, black/heme positive stool  Contact is her son  We have her scheduled to return for labs early next week  Thanks,  Brad

## 2019-10-16 ENCOUNTER — Telehealth: Payer: Self-pay | Admitting: Oncology

## 2019-10-16 LAB — TYPE AND SCREEN
ABO/RH(D): A NEG
Antibody Screen: NEGATIVE
Unit division: 0

## 2019-10-16 LAB — BPAM RBC
Blood Product Expiration Date: 202104242359
ISSUE DATE / TIME: 202104161640
Unit Type and Rh: 600

## 2019-10-16 NOTE — Telephone Encounter (Signed)
Scheduled per los. Called and spoke with patients son, bradley. Confirmed lab appt for tomorrow. He will stop by scheduling then to schedule f/u for 4/27

## 2019-10-17 ENCOUNTER — Inpatient Hospital Stay: Payer: Medicare Other

## 2019-10-17 ENCOUNTER — Ambulatory Visit (INDEPENDENT_AMBULATORY_CARE_PROVIDER_SITE_OTHER): Payer: Medicare Other | Admitting: Gastroenterology

## 2019-10-17 ENCOUNTER — Encounter: Payer: Self-pay | Admitting: Gastroenterology

## 2019-10-17 ENCOUNTER — Other Ambulatory Visit (INDEPENDENT_AMBULATORY_CARE_PROVIDER_SITE_OTHER): Payer: Medicare Other

## 2019-10-17 VITALS — BP 146/52 | HR 78 | Temp 98.3°F | Ht 64.0 in | Wt 107.0 lb

## 2019-10-17 DIAGNOSIS — K921 Melena: Secondary | ICD-10-CM

## 2019-10-17 DIAGNOSIS — R195 Other fecal abnormalities: Secondary | ICD-10-CM | POA: Insufficient documentation

## 2019-10-17 DIAGNOSIS — D649 Anemia, unspecified: Secondary | ICD-10-CM

## 2019-10-17 LAB — CBC WITH DIFFERENTIAL/PLATELET
Basophils Absolute: 0 10*3/uL (ref 0.0–0.1)
Basophils Relative: 0.5 % (ref 0.0–3.0)
Eosinophils Absolute: 0.1 10*3/uL (ref 0.0–0.7)
Eosinophils Relative: 1.4 % (ref 0.0–5.0)
HCT: 32.3 % — ABNORMAL LOW (ref 36.0–46.0)
Hemoglobin: 11.1 g/dL — ABNORMAL LOW (ref 12.0–15.0)
Lymphocytes Relative: 8.6 % — ABNORMAL LOW (ref 12.0–46.0)
Lymphs Abs: 0.6 10*3/uL — ABNORMAL LOW (ref 0.7–4.0)
MCHC: 34.3 g/dL (ref 30.0–36.0)
MCV: 87 fl (ref 78.0–100.0)
Monocytes Absolute: 1 10*3/uL (ref 0.1–1.0)
Monocytes Relative: 13.8 % — ABNORMAL HIGH (ref 3.0–12.0)
Neutro Abs: 5.5 10*3/uL (ref 1.4–7.7)
Neutrophils Relative %: 75.7 % (ref 43.0–77.0)
Platelets: 432 10*3/uL — ABNORMAL HIGH (ref 150.0–400.0)
RBC: 3.71 Mil/uL — ABNORMAL LOW (ref 3.87–5.11)
RDW: 15.6 % — ABNORMAL HIGH (ref 11.5–15.5)
WBC: 7.3 10*3/uL (ref 4.0–10.5)

## 2019-10-17 NOTE — Patient Instructions (Addendum)
If you are age 78 or older, your body mass index should be between 23-30. Your Body mass index is 18.37 kg/m. If this is out of the aforementioned range listed, please consider follow up with your Primary Care Provider.  If you are age 6 or younger, your body mass index should be between 19-25. Your Body mass index is 18.37 kg/m. If this is out of the aformentioned range listed, please consider follow up with your Primary Care Provider.    Your provider has requested that you go to the basement level for lab work before leaving today. Press "B" on the elevator. The lab is located at the first door on the left as you exit the elevator.   You have been scheduled for an endoscopy. Please follow written instructions given to you at your visit today. If you use inhalers (even only as needed), please bring them with you on the day of your procedure.

## 2019-10-17 NOTE — Progress Notes (Signed)
10/17/2019 Chloe Horan [id number] 1940-06-30   HISTORY OF PRESENT ILLNESS:  This is a pleasant 79 year old female who is new to our office.  Referred here by Dr. Benay Spice of hematology/oncology for evaluation regarding anemia and black, heme positive stools.  Has been evaluated extensively recently in regards to her anemia with unremarkable evaluation.  Recently reported black coffee ground like stools to Dr. Benay Spice and was strongly heme positive.  Most recent hemoglobin 6.7 g as compared to 10 g just less than 2 weeks prior. Has been transfused with 2 units of packed red blood cells at the end of last week.  Never had an EGD or colonoscopy in the past.  Apparently has lost some weight according to both of them as well, but unable to quantify how much.  Has some hearing difficulty and memory/cognitive impairment.  Her son is with her today.  She is usually on Plavix, but that has been on hold.  Past Medical History:  Diagnosis Date  . Back pain   . Basal cell carcinoma   . Carotid artery stenosis   . Cognitive decline   . Diabetes mellitus without complication ([place])   . GI bleed   . Hypertension   . Renal disorder   . Stroke (cerebrum) [facility])    Past Surgical History:  Procedure Laterality Date  . BASAL CELL CARCINOMA EXCISION    . VAGINAL HYSTERECTOMY      reports that she has been smoking. She has never used smokeless tobacco. She reports previous alcohol use. She reports previous drug use. family history includes Colon cancer in her mother. No Known Allergies    Outpatient Encounter Medications as of 10/17/2019  Medication Sig  . acetaminophen (TYLENOL) 500 MG tablet Take 500 mg by mouth every 8 (eight) hours as needed for mild pain or headache.  Marland Kitchen amLODipine (NORVASC) 10 MG tablet Take 1 tablet (10 mg total) by mouth daily.  Marland Kitchen atorvastatin (LIPITOR) 40 MG tablet Take 1 tablet (40 mg total) by mouth daily at 6 PM. (Patient taking differently: Take 40 mg by mouth daily. )    . fluticasone furoate-vilanterol (BREO ELLIPTA) 100-25 MCG/INH AEPB Inhale 1 puff into the lungs daily.  . Glucosamine-Chondroit-Vit C-Mn (GLUCOSAMINE 1500 COMPLEX PO) Take 1,500 mg by mouth in the morning and at bedtime.  . hydrALAZINE (APRESOLINE) 25 MG tablet Take 1 tablet (25 mg total) by mouth 2 (two) times daily.  Marland Kitchen LORazepam (ATIVAN) 0.5 MG tablet Take 0.5 mg by mouth 2 (two) times daily as needed.  Marland Kitchen losartan (COZAAR) 50 MG tablet Take 1 tablet (50 mg total) by mouth daily. (Patient taking differently: Take 50 mg by mouth 2 (two) times daily. )  . metFORMIN (GLUCOPHAGE) 500 MG tablet Take 500 mg by mouth daily with breakfast.  . metoprolol tartrate (LOPRESSOR) 50 MG tablet Take 50 mg by mouth daily.   . Multiple Vitamin (MULTIVITAMIN WITH MINERALS) TABS tablet Take 1 tablet by mouth daily.  . Omega-3 Fatty Acids (FISH OIL) 1000 MG CAPS Take 1,000 mg by mouth daily.  . clopidogrel (PLAVIX) 75 MG tablet Take 75 mg by mouth daily.  . [DISCONTINUED] ferrous sulfate 325 (65 FE) MG tablet Take 1 tablet (325 mg total) by mouth daily with breakfast.   No facility-administered encounter medications on file as of 10/17/2019.     REVIEW OF SYSTEMS  : All other systems reviewed and negative except where noted in the History of Present Illness.   PHYSICAL EXAM: BP Marland Kitchen)  146/52   Pulse 78   Temp 98.3 F (36.8 C)   Ht 5\' 4"  (1.626 m)   Wt 107 lb (48.5 kg)   BMI 18.37 kg/m  General: Well developed white female in no acute distress Head: Normocephalic and atraumatic Eyes:  Sclerae anicteric, conjunctiva pink. Ears: Normal auditory acuity Lungs: Clear throughout to auscultation; no increased WOB. Heart: Regular rate and rhythm. Abdomen: Soft, non-distended.  BS present.  Non-tender. Musculoskeletal: Symmetrical with no gross deformities  Skin: No lesions on visible extremities Extremities: No edema  Neurological: Alert oriented x 4, grossly non-focal Psychological:  Alert and cooperative.  Normal mood and affect  ASSESSMENT AND PLAN: *Anemia, has been evaluated by hematology extensively:  Just received 2 units of PRBC's again last week for Hgb of 6.7 grams.  Recently reported black, coffee ground stools that were strongly heme positive.  Is off of her Plavix.  Discussed with Dr. Havery Moros and scheduled EGD for tomorrow AM.  The risks, benefits, and alternatives to EGD were discussed with the patient and she consents to proceed.   Will check repeat CBC today.  CC:  Ladell Pier, MD

## 2019-10-18 ENCOUNTER — Other Ambulatory Visit: Payer: Self-pay

## 2019-10-18 ENCOUNTER — Ambulatory Visit (AMBULATORY_SURGERY_CENTER): Payer: Medicare Other | Admitting: Gastroenterology

## 2019-10-18 ENCOUNTER — Encounter: Payer: Self-pay | Admitting: Gastroenterology

## 2019-10-18 VITALS — BP 137/50 | HR 86 | Temp 98.2°F | Resp 19 | Ht 64.0 in | Wt 107.0 lb

## 2019-10-18 DIAGNOSIS — R195 Other fecal abnormalities: Secondary | ICD-10-CM | POA: Diagnosis not present

## 2019-10-18 DIAGNOSIS — D649 Anemia, unspecified: Secondary | ICD-10-CM

## 2019-10-18 MED ORDER — SODIUM CHLORIDE 0.9 % IV SOLN: 500.0000 mL | Freq: Once | INTRAVENOUS | Status: DC

## 2019-10-18 NOTE — Progress Notes (Signed)
PT taken to PACU. Monitors in place. VSS. Report given to RN. 

## 2019-10-18 NOTE — Progress Notes (Signed)
Temperature taken by L.C., VS taken by C.W. 

## 2019-10-18 NOTE — Progress Notes (Signed)
Agree with assessment as outlined. Interestingly in March she had severe anemia, Hgb of 7s, heme negative with normal iron studies, but more recently with some dark stools that are heme positive. Agree with EGD ASAP to clear upper tract first while she is holding plavix. If EGD negative, will need to proceed with colonoscopy if she can tolerate a bowel prep. Recent CT scan, limited by lack of IV contrast, but did not show any obvious mass lesions.

## 2019-10-18 NOTE — Progress Notes (Signed)
No problems noted in the recovery room. [place]  Dr. Havery Moros recommended colonoscopy tommorrow with Dr. Loletha Carrow. Pt and her son were introduced to Dr. Loletha Carrow. Randall Hiss went over colonoscopy and prep instructions with pt's son.  A Plenvu prep was given to pt's son. I went over discharge instructions with pt and her son.  Pt was instructed not to eat solid food today.  To push clear liquids.  Hold PLAVIX.  Cyril Mourning went over instructions for diabetic med.  MAW

## 2019-10-18 NOTE — Patient Instructions (Addendum)
YOU HAD AN ENDOSCOPIC PROCEDURE TODAY AT [facility]:   Refer to the procedure report that was given to you for any specific questions about what was found during the examination.  If the procedure report does not answer your questions, please call your gastroenterologist to clarify.  If you requested that your care partner not be given the details of your procedure findings, then the procedure report has been included in a sealed envelope for you to review at your convenience later.  YOU SHOULD EXPECT: Some feelings of bloating in the abdomen. Passage of more gas than usual.  Walking can help get rid of the air that was put into your GI tract during the procedure and reduce the bloating. If you had a lower endoscopy (such as a colonoscopy or flexible sigmoidoscopy) you may notice spotting of blood in your stool or on the toilet paper. If you underwent a bowel prep for your procedure, you may not have a normal bowel movement for a few days.  Please Note:  You might notice some irritation and congestion in your nose or some drainage.  This is from the oxygen used during your procedure.  There is no need for concern and it should clear up in a day or so.  SYMPTOMS TO REPORT IMMEDIATELY:     Following upper endoscopy (EGD)  Vomiting of blood or coffee ground material  New chest pain or pain under the shoulder blades  Painful or persistently difficult swallowing  New shortness of breath  Fever of 100F or higher  Black, tarry-looking stools  For urgent or emergent issues, a gastroenterologist can be reached at any hour by calling [phone]. Do not use MyChart messaging for urgent concerns.    DIET: Please follow the prep instructions for colonoscopy tomorrow with Dr. Wilfrid Lund.  NO SOLID FOOD TODAY> Drink plenty of fluids but you should avoid alcoholic. ACTIVITY:  You should plan to take it easy for the rest of today and you should NOT DRIVE or use heavy machinery until  tomorrow (because of the sedation medicines used during the test).    FOLLOW UP: Our staff will call the number listed on your records 48-72 hours following your procedure to check on you and address any questions or concerns that you may have regarding the information given to you following your procedure. If we do not reach you, we will leave a message.  We will attempt to reach you two times.  During this call, we will ask if you have developed any symptoms of COVID 19. If you develop any symptoms (ie: fever, flu-like symptoms, shortness of breath, cough etc.) before then, please call [phone].  If you test positive for Covid 19 in the 2 weeks post procedure, please call and report this information to [place].    If any biopsies were taken you will be contacted by phone or by letter within the next 1-3 weeks.  Please call us at [phone] if you have not heard about the biopsies in 3 weeks.    SIGNATURES/CONFIDENTIALITY: You and/or your care partner have signed paperwork which will be entered into your electronic medical record.  These signatures attest to the fact that that the information above on your After Visit Summary has been reviewed and is understood.  Full responsibility of the confidentiality of this discharge information lies with you and/or your care-partner.    Continue to HOLD PLAVIX.  Your blood sugar was 146 in the recovery room.  Colonoscopy  tomorrow with Dr. Loletha Carrow.  A sample prep, PLENVU, was given to patient's son.    See prep instructions to follow for the rest of today. NO SOLID FOOD TODAY. Please follow prep instructions to advise what meds to hold other than PLAVIX. Await biopsy results. Please call if any questions or concerns.

## 2019-10-18 NOTE — Op Note (Signed)
[facility] Patient Name: Chloe Fletcher Procedure Date: 10/18/2019 9:24 AM MRN: [id number] Endoscopist: Remo Lipps P. Havery Moros , MD Age: 79 Referring MD:  Date of Birth: [birth year] Gender: Female Account #: [id number] Procedure:                Upper GI endoscopy Indications:              Suspected upper gastrointestinal bleeding - anemia,                            dark stools that were recently heme positive -                            patient responded to RBC transfusion, Hgb from 6s                            now to 11s Medicines:                Monitored Anesthesia Care Procedure:                Pre-Anesthesia Assessment:                           - Prior to the procedure, a History and Physical                            was performed, and patient medications and                            allergies were reviewed. The patient's tolerance of                            previous anesthesia was also reviewed. The risks                            and benefits of the procedure and the sedation                            options and risks were discussed with the patient.                            All questions were answered, and informed consent                            was obtained. Prior Anticoagulants: The patient has                            taken Plavix (clopidogrel), last dose was 12 days                            prior to procedure. ASA Grade Assessment: III - A                            patient with severe systemic disease. After  reviewing the risks and benefits, the patient was                            deemed in satisfactory condition to undergo the                            procedure.                           After obtaining informed consent, the endoscope was                            passed under direct vision. Throughout the                            procedure, the patient's blood pressure, pulse, and    oxygen saturations were monitored continuously. The                            Endoscope was introduced through the mouth, and                            advanced to the second part of duodenum. The upper                            GI endoscopy was accomplished without difficulty.                            The patient tolerated the procedure well. Scope In: Scope Out: Findings:                 Esophagogastric landmarks were identified: the                            Z-line was found at 39 cm, the gastroesophageal                            junction was found at 39 cm and the upper extent of                            the gastric folds was found at 39 cm from the                            incisors.                           The exam of the esophagus was otherwise normal.                           Patchy mildly erythematous mucosa was found in the                            gastric antrum without focal ulceration.  The exam of the stomach was otherwise normal.                           Biopsies were taken with a cold forceps in the                            gastric body, at the incisura and in the gastric                            antrum for Helicobacter pylori testing.                           The duodenal bulb and second portion of the                            duodenum were normal. The duodenal sweep was                            angulated. No heme or blood noted anywhere upon                            intubation. Small heme from endoscopic trauma of                            the sweep noted on withdrawal and observed, no                            persistent bleeding noted. Complications:            No immediate complications. Estimated blood loss:                            Minimal. Estimated Blood Loss:     Estimated blood loss was minimal. Impression:               - Esophagogastric landmarks identified.                           - Normal esophagus  otherwise                           - Erythematous mucosa in the antrum without focal                            ulceration.                           - Normal stomach otherwise. Biopsies taken to rule                            out H pylori                           - Normal duodenal bulb and second portion of the  duodenum. Angulated duodenal sweep.                           No obvious cause for anemia / symptoms on EGD. Will                            discuss colonoscopy with the patient and son and                            proceed with that if the patient is willing. Recommendation:           - Patient has a contact number available for                            emergencies. The signs and symptoms of potential                            delayed complications were discussed with the                            patient. Return to normal activities tomorrow.                            Written discharge instructions were provided to the                            patient.                           - Resume previous diet.                           - Continue present medications. Continue to hold                            Plavix right now, will plan on colonoscopy in the                            near future if patient and family amenable.                           - Await pathology results. Remo Lipps P. Kerrington Sova, MD 10/18/2019 9:50:01 AM This report has been signed electronically.

## 2019-10-18 NOTE — Progress Notes (Signed)
Called to room to assist during endoscopic procedure.  Patient ID and intended procedure confirmed with present staff. Received instructions for my participation in the procedure from the performing physician.   Prep instructions reviewed with her son.  Pt is to be off Plavix until after procedure tomorrow per DO She has had both her covid vaccines.

## 2019-10-19 ENCOUNTER — Ambulatory Visit (AMBULATORY_SURGERY_CENTER): Payer: Medicare Other | Admitting: Gastroenterology

## 2019-10-19 ENCOUNTER — Telehealth: Payer: Self-pay | Admitting: Gastroenterology

## 2019-10-19 ENCOUNTER — Encounter: Payer: Self-pay | Admitting: Gastroenterology

## 2019-10-19 VITALS — BP 113/80 | HR 68 | Temp 98.4°F | Resp 23 | Ht 64.0 in | Wt 107.0 lb

## 2019-10-19 DIAGNOSIS — D500 Iron deficiency anemia secondary to blood loss (chronic): Secondary | ICD-10-CM | POA: Diagnosis not present

## 2019-10-19 DIAGNOSIS — D122 Benign neoplasm of ascending colon: Secondary | ICD-10-CM

## 2019-10-19 DIAGNOSIS — D128 Benign neoplasm of rectum: Secondary | ICD-10-CM | POA: Diagnosis not present

## 2019-10-19 DIAGNOSIS — D120 Benign neoplasm of cecum: Secondary | ICD-10-CM

## 2019-10-19 DIAGNOSIS — R195 Other fecal abnormalities: Secondary | ICD-10-CM

## 2019-10-19 DIAGNOSIS — D123 Benign neoplasm of transverse colon: Secondary | ICD-10-CM

## 2019-10-19 MED ORDER — SODIUM CHLORIDE 0.9 % IV SOLN: 500.0000 mL | INTRAVENOUS | Status: DC

## 2019-10-19 MED ORDER — SODIUM CHLORIDE 0.9 % IV SOLN: 500.0000 mL | Freq: Once | INTRAVENOUS | Status: DC

## 2019-10-19 NOTE — Patient Instructions (Signed)
Resume plavix at previous dose in 7 days. (Thursday April 29th)  Handouts given for polyps and diverticulosis.   YOU HAD AN ENDOSCOPIC PROCEDURE TODAY AT [facility]:   Refer to the procedure report that was given to you for any specific questions about what was found during the examination.  If the procedure report does not answer your questions, please call your gastroenterologist to clarify.  If you requested that your care partner not be given the details of your procedure findings, then the procedure report has been included in a sealed envelope for you to review at your convenience later.  YOU SHOULD EXPECT: Some feelings of bloating in the abdomen. Passage of more gas than usual.  Walking can help get rid of the air that was put into your GI tract during the procedure and reduce the bloating. If you had a lower endoscopy (such as a colonoscopy or flexible sigmoidoscopy) you may notice spotting of blood in your stool or on the toilet paper. If you underwent a bowel prep for your procedure, you may not have a normal bowel movement for a few days.  Please Note:  You might notice some irritation and congestion in your nose or some drainage.  This is from the oxygen used during your procedure.  There is no need for concern and it should clear up in a day or so.  SYMPTOMS TO REPORT IMMEDIATELY:   Following lower endoscopy (colonoscopy or flexible sigmoidoscopy):  Excessive amounts of blood in the stool  Significant tenderness or worsening of abdominal pains  Swelling of the abdomen that is new, acute  Fever of 100F or higher  For urgent or emergent issues, a gastroenterologist can be reached at any hour by calling [phone]. Do not use MyChart messaging for urgent concerns.    DIET:  We do recommend a small meal at first, but then you may proceed to your regular diet.  Drink plenty of fluids but you should avoid alcoholic beverages for 24 hours.  ACTIVITY:  You  should plan to take it easy for the rest of today and you should NOT DRIVE or use heavy machinery until tomorrow (because of the sedation medicines used during the test).    FOLLOW UP: Our staff will call the number listed on your records 48-72 hours following your procedure to check on you and address any questions or concerns that you may have regarding the information given to you following your procedure. If we do not reach you, we will leave a message.  We will attempt to reach you two times.  During this call, we will ask if you have developed any symptoms of COVID 19. If you develop any symptoms (ie: fever, flu-like symptoms, shortness of breath, cough etc.) before then, please call [phone].  If you test positive for Covid 19 in the 2 weeks post procedure, please call and report this information to [place].    If any biopsies were taken you will be contacted by phone or by letter within the next 1-3 weeks.  Please call us at [phone] if you have not heard about the biopsies in 3 weeks.    SIGNATURES/CONFIDENTIALITY: You and/or your care partner have signed paperwork which will be entered into your electronic medical record.  These signatures attest to the fact that that the information above on your After Visit Summary has been reviewed and is understood.  Full responsibility of the confidentiality of this discharge information lies with you and/or your  care-partner.

## 2019-10-19 NOTE — Op Note (Signed)
[facility] Patient Name: Kierstynn Joe Procedure Date: 10/19/2019 10:00 AM MRN: [id number] Endoscopist: [place]. Loletha Carrow , MD Age: 79 Referring MD:  Date of Birth: Jul 12, 1940 Gender: Female Account #: [id number] Procedure:                Colonoscopy Indications:              Iron deficiency anemia secondary to chronic blood                            loss (recent hematology evaluation. initially heme                            negative during hospitalization. recently                            black/heme positive stool at hematology office                            visit. no source on EGD yesterday with Dr.                            Havery Moros. on plavix for prior CVA) Medicines:                Monitored Anesthesia Care Procedure:                Pre-Anesthesia Assessment:                           - Prior to the procedure, a History and Physical                            was performed, and patient medications and                            allergies were reviewed. The patient's tolerance of                            previous anesthesia was also reviewed. The risks                            and benefits of the procedure and the sedation                            options and risks were discussed with the patient.                            All questions were answered, and informed consent                            was obtained. Prior Anticoagulants: The patient has                            taken Plavix (clopidogrel), last dose was 6 days  prior to procedure. ASA Grade Assessment: III - A                            patient with severe systemic disease. After                            reviewing the risks and benefits, the patient was                            deemed in satisfactory condition to undergo the                            procedure.                           After obtaining informed consent, the colonoscope    was passed under direct vision. Throughout the                            procedure, the patient's blood pressure, pulse, and                            oxygen saturations were monitored continuously. The                            Colonoscope was introduced through the anus and                            advanced to the the terminal ileum, with                            identification of the appendiceal orifice and IC                            valve. The colonoscopy was performed without                            difficulty. The patient tolerated the procedure                            well. The quality of the bowel preparation was good                            except proximal ascending colon, which could not be                            completely cleared despite lavage. The terminal                            ileum, ileocecal valve, appendiceal orifice, and                            rectum were photographed. The bowel preparation  used was Plenvu. Scope In: 10:19:19 AM Scope Out: 10:44:38 AM Scope Withdrawal Time: 0 hours 21 minutes 41 seconds  Total Procedure Duration: 0 hours 25 minutes 19 seconds  Findings:                 The digital rectal exam findings include decreased                            sphincter tone.                           The terminal ileum appeared normal.                           Multiple small-mouthed diverticula were found in                            the left colon.                           Three sessile polyps were found in the ascending                            colon. The polyps were diminutive in size. These                            polyps were removed with a cold snare. Resection                            and retrieval were complete.                           A 10 mm polyp was found in the transverse colon.                            The polyp was sessile. The polyp was removed with a                             cold snare. Resection and retrieval were complete.                           A 6 mm polyp was found in the rectum. The polyp was                            pedunculated. The polyp was removed with a hot                            snare. Resection and retrieval were complete.                           The exam was otherwise without abnormality on                            direct and retroflexion views. Complications:  No immediate complications. Estimated Blood Loss:     Estimated blood loss was minimal. Impression:               - Decreased sphincter tone found on digital rectal                            exam.                           - The examined portion of the ileum was normal.                           - Diverticulosis in the left colon.                           - Three diminutive polyps in the ascending colon,                            removed with a cold snare. Resected and retrieved.                           - One 10 mm polyp in the transverse colon, removed                            with a cold snare. Resected and retrieved.                           - One 6 mm polyp in the rectum, removed with a hot                            snare. Resected and retrieved.                           - The examination was otherwise normal on direct                            and retroflexion views. Recommendation:           - Patient has a contact number available for                            emergencies. The signs and symptoms of potential                            delayed complications were discussed with the                            patient. Return to normal activities tomorrow.                            Written discharge instructions were provided to the                            patient.                           -  Resume previous diet.                           - Await pathology results.                           - No repeat routine surveillance colonoscopy due to                             age.                           - Will discuss with Dr. Havery Moros small bowel                            video capsule study.                           - Resume Plavix (clopidogrel) at prior dose in 7                            days. Hailyn Zarr L. Loletha Carrow, MD 10/19/2019 10:58:53 AM This report has been signed electronically.

## 2019-10-19 NOTE — Progress Notes (Signed)
Called to room to assist during endoscopic procedure.  Patient ID and intended procedure confirmed with present staff. Received instructions for my participation in the procedure from the performing physician.  

## 2019-10-19 NOTE — Telephone Encounter (Signed)
EGD and colonoscopy negative for source of anemia / bleeding.  Recommend capsule endoscopy to clear her small bowel, hopefully can do it soon as we need to make a decision soon about resuming Plavix.  Barbera Setters can you please contact the patient's son - POC for this patient with dementia. Recommending capsule endoscopy ASAP if you can help coordinate. Dx anemia / dark stools. Thanks

## 2019-10-19 NOTE — Progress Notes (Signed)
PT taken to PACU. Monitors in place. VSS. Report given to RN. 

## 2019-10-20 ENCOUNTER — Telehealth: Payer: Self-pay

## 2019-10-20 NOTE — Telephone Encounter (Signed)
Left message for patient to call back  

## 2019-10-20 NOTE — Telephone Encounter (Signed)
  Follow up Call-  Call back number 10/19/2019 10/18/2019  Post procedure Call Back phone  # [phone] [facility] [place], [place] memory care, [phone]  Permission to leave phone message Yes Yes     Patient questions:  Do you have a fever, pain , or abdominal swelling? No. Pain Score  0 *  Have you tolerated food without any problems? Yes.    Have you been able to return to your normal activities? Yes.    Do you have any questions about your discharge instructions: Diet   No. Medications  No. Follow up visit  No.  Do you have questions or concerns about your Care? No.  Actions: * If pain score is 4 or above: No action needed, pain <4.  1. Have you developed a fever since your procedure? no  2.   Have you had an respiratory symptoms (SOB or cough) since your procedure? no  3.   Have you tested positive for COVID 19 since your procedure no  4.   Have you had any family members/close contacts diagnosed with the COVID 19 since your procedure?  no   If yes to any of these questions please route to Joylene John, RN and Erenest Rasher, RN

## 2019-10-20 NOTE — Telephone Encounter (Signed)
Patient returned your call, please call patient one more time.   

## 2019-10-20 NOTE — Telephone Encounter (Signed)
I spoke with the patient's son to arrange capsule endoscopy. He has a few concerns.  He is asking if the procedure is absolutely necessary?  Will finding AVMs in her small bowel alter her tx or are you looking for something else? Patient lives in an assisted living and is not certain that the staff will be able to be with her at toileting trips post procedure to retrieve the capsule.  He is willing to have them try if Dr. Havery Moros feels very strongly about the procedure and the outcome will change the course of her tx.  Please advise.  He is aware that I will contact him back next week when Dr. Havery Moros returns to the office.

## 2019-10-23 ENCOUNTER — Encounter: Payer: Self-pay | Admitting: *Deleted

## 2019-10-23 NOTE — Telephone Encounter (Signed)
Called the patient's son, explained the situation. She is on Plavix for history of a CVA, had overt bleeding and worsening anemia leading to transfusion. EGD and colonoscopy without clear cause. She has been off her Plavix since colonoscopy with Dr. Loletha Carrow. Discussed indications for capsule, this will help clarify where she is having the bleeding and see if we can localize it to determine if treatable endoscopically. She is at risk for recurrent bleeding if we don't look. On the other hand if they decline, she is at risk of recurrent CVA without the plavix, and high risk for rebleeding without the evaluation. Following this discussion he wanted to proceed. It is fairly straightforward to get the capsule once it has passed, staff at assisted living should be able to do this.   Sheri, can you please proceed with schedule capsule endoscopy for this patient, this week if possible. When she comes in for the capsule I would like her to have a repeat CBC. Thanks

## 2019-10-23 NOTE — Telephone Encounter (Signed)
Patient was called 4/23

## 2019-10-25 ENCOUNTER — Other Ambulatory Visit: Payer: Self-pay

## 2019-10-25 DIAGNOSIS — R195 Other fecal abnormalities: Secondary | ICD-10-CM

## 2019-10-25 NOTE — Telephone Encounter (Signed)
Okay thanks Sheri 

## 2019-10-25 NOTE — Telephone Encounter (Signed)
Patient has been scheduled for capsule endo for 11/03/19.  Patient's son will come pick up her instructions tomorrow.  He wanted Dr Havery Moros to be aware that patient's Hgb was checked today by PCP and remains at 10.5

## 2019-10-26 ENCOUNTER — Encounter: Payer: Self-pay | Admitting: Gastroenterology

## 2019-10-30 ENCOUNTER — Telehealth: Payer: Self-pay | Admitting: Gastroenterology

## 2019-10-30 NOTE — Telephone Encounter (Signed)
Patient's son Leory Plowman) called states the patient was at ED and she will no longer be able to proceed with capsule procedure is seeking advise and also needs to cancel the appt

## 2019-10-30 NOTE — Telephone Encounter (Signed)
Left message for patient to call back  

## 2019-10-31 NOTE — Telephone Encounter (Signed)
Thanks for the update Sheri. This is a very difficult situation. The patient has a history of CVA and good indication for Plavix, but had worsening anemia and reported dark stools that led to transfusion last month. EGD and colonoscopy negative for a cause, suspect she had small bowel bleeding. Noncontrast CT previously did not show anything concerning in her bowel. Capsule endoscopy was recommended to clear her small bowel in light of indication to resume Plavix and assess her risk for rebleeding.  If she can't tolerate prep or won't do it, then option is to resume Plavix and monitor closely for recurrent bleeding, or continue to hold Plavix and monitor for recurrent bleeding. I'm concerned about her risk for CVA if we don't resume the Plavix in he near future and I have discussed this with the patient's son at length previously. If she wishes to resume it that may be reasonable but there is certainly risk for rebleeding and worsening anemia. If his desire is to resume the Plavix and forgo the capsule since she can't comply with it, they need to watch her very closely and would check another CBC shortly after starting. I think she is due for a follow up CBC regardless if they resume it or not to make sure Hgb has been stable over the past few weeks. Can you let me know how he wishes to proceed? I am out of the office this week but can try to call him later if son would prefer that prior to making a decision. Thanks

## 2019-10-31 NOTE — Telephone Encounter (Signed)
I spoke with the patient's son today. Patient has had a significant cognitive decline in the last few days.  She is refusing to eat or drink.  Assisted living facility does not feel they will be able to get her to do the prep and may have difficulty retrieving the capsule.  Patient's son had a conversation with the PCP about resuming th e Plavix then checking her blood count a week or so later.  He is asking your thoughts.  He has asked me to cancel the capsule scheduled for Friday.

## 2019-11-01 NOTE — Telephone Encounter (Signed)
Left message for patient to call back  

## 2019-11-02 NOTE — Telephone Encounter (Signed)
Thanks Sheri, appreciate the follow up. Will await her CBC

## 2019-11-02 NOTE — Telephone Encounter (Signed)
Left message for patient to call back  

## 2019-11-02 NOTE — Telephone Encounter (Signed)
Discussed with the patient's son. They want to proceed with resuming Plavix.  Her primary plans to do a CBC every other day after resuming Plavix to make sure her Hbg remains stable.
